# Patient Record
Sex: Male | Born: 1955 | Race: White | Hispanic: No | Marital: Married | Smoking: Former smoker
Health system: Southern US, Community
[De-identification: ages and names within clinical notes are randomized; demographics above are authoritative.]

## PROBLEM LIST (undated history)

## (undated) DIAGNOSIS — N529 Male erectile dysfunction, unspecified: Secondary | ICD-10-CM

## (undated) DIAGNOSIS — Z720 Tobacco use: Secondary | ICD-10-CM

## (undated) DIAGNOSIS — F32A Depression, unspecified: Secondary | ICD-10-CM

## (undated) DIAGNOSIS — E785 Hyperlipidemia, unspecified: Secondary | ICD-10-CM

## (undated) DIAGNOSIS — E559 Vitamin D deficiency, unspecified: Secondary | ICD-10-CM

## (undated) DIAGNOSIS — I10 Essential (primary) hypertension: Secondary | ICD-10-CM

## (undated) DIAGNOSIS — M1990 Unspecified osteoarthritis, unspecified site: Secondary | ICD-10-CM

## (undated) DIAGNOSIS — F329 Major depressive disorder, single episode, unspecified: Secondary | ICD-10-CM

## (undated) DIAGNOSIS — R079 Chest pain, unspecified: Secondary | ICD-10-CM

## (undated) DIAGNOSIS — F419 Anxiety disorder, unspecified: Secondary | ICD-10-CM

## (undated) DIAGNOSIS — I639 Cerebral infarction, unspecified: Secondary | ICD-10-CM

## (undated) HISTORY — PX: APPENDECTOMY: SHX54

## (undated) HISTORY — DX: Major depressive disorder, single episode, unspecified: F32.9

## (undated) HISTORY — DX: Depression, unspecified: F32.A

## (undated) HISTORY — DX: Unspecified osteoarthritis, unspecified site: M19.90

## (undated) HISTORY — DX: Cerebral infarction, unspecified: I63.9

## (undated) HISTORY — DX: Essential (primary) hypertension: I10

## (undated) HISTORY — DX: Anxiety disorder, unspecified: F41.9

---
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
## 2002-06-01 ENCOUNTER — Emergency Department (HOSPITAL_COMMUNITY): Admission: EM | Admit: 2002-06-01 | Discharge: 2002-06-02 | Payer: Self-pay | Admitting: Emergency Medicine

## 2002-06-02 ENCOUNTER — Encounter: Payer: Self-pay | Admitting: Emergency Medicine

## 2002-07-08 ENCOUNTER — Ambulatory Visit (HOSPITAL_COMMUNITY): Admission: RE | Admit: 2002-07-08 | Discharge: 2002-07-08 | Payer: Self-pay | Admitting: Family Medicine

## 2002-07-08 ENCOUNTER — Encounter: Payer: Self-pay | Admitting: Family Medicine

## 2003-08-14 DIAGNOSIS — R079 Chest pain, unspecified: Secondary | ICD-10-CM

## 2003-08-14 HISTORY — DX: Chest pain, unspecified: R07.9

## 2003-12-05 ENCOUNTER — Emergency Department (HOSPITAL_COMMUNITY): Admission: EM | Admit: 2003-12-05 | Discharge: 2003-12-05 | Payer: Self-pay | Admitting: Emergency Medicine

## 2003-12-20 ENCOUNTER — Observation Stay (HOSPITAL_COMMUNITY): Admission: AD | Admit: 2003-12-20 | Discharge: 2003-12-21 | Payer: Self-pay | Admitting: *Deleted

## 2012-03-26 ENCOUNTER — Observation Stay (HOSPITAL_COMMUNITY): Payer: Self-pay

## 2012-03-26 ENCOUNTER — Encounter (HOSPITAL_COMMUNITY): Payer: Self-pay | Admitting: Emergency Medicine

## 2012-03-26 ENCOUNTER — Observation Stay (HOSPITAL_COMMUNITY)
Admission: EM | Admit: 2012-03-26 | Discharge: 2012-03-27 | Disposition: A | Discharge status: Home / Self Care | Payer: Self-pay | Attending: Internal Medicine | Admitting: Internal Medicine

## 2012-03-26 ENCOUNTER — Emergency Department (HOSPITAL_COMMUNITY): Payer: Self-pay

## 2012-03-26 DIAGNOSIS — Z720 Tobacco use: Secondary | ICD-10-CM | POA: Diagnosis present

## 2012-03-26 DIAGNOSIS — E785 Hyperlipidemia, unspecified: Secondary | ICD-10-CM | POA: Insufficient documentation

## 2012-03-26 DIAGNOSIS — F17200 Nicotine dependence, unspecified, uncomplicated: Secondary | ICD-10-CM | POA: Insufficient documentation

## 2012-03-26 DIAGNOSIS — R001 Bradycardia, unspecified: Secondary | ICD-10-CM | POA: Diagnosis present

## 2012-03-26 DIAGNOSIS — R0602 Shortness of breath: Secondary | ICD-10-CM | POA: Insufficient documentation

## 2012-03-26 DIAGNOSIS — I498 Other specified cardiac arrhythmias: Secondary | ICD-10-CM | POA: Insufficient documentation

## 2012-03-26 DIAGNOSIS — R079 Chest pain, unspecified: Principal | ICD-10-CM | POA: Insufficient documentation

## 2012-03-26 HISTORY — DX: Chest pain, unspecified: R07.9

## 2012-03-26 HISTORY — DX: Tobacco use: Z72.0

## 2012-03-26 HISTORY — DX: Hyperlipidemia, unspecified: E78.5

## 2012-03-26 LAB — BASIC METABOLIC PANEL
BUN: 8 mg/dL (ref 6–23)
CO2: 28 mEq/L (ref 19–32)
Calcium: 9.3 mg/dL (ref 8.4–10.5)
Chloride: 101 mEq/L (ref 96–112)
Creatinine, Ser: 1.19 mg/dL (ref 0.50–1.35)
GFR calc Af Amer: 77 mL/min — ABNORMAL LOW (ref >90)
GFR calc non Af Amer: 67 mL/min — ABNORMAL LOW (ref >90)
Glucose, Bld: 93 mg/dL (ref 70–99)
Potassium: 4.1 mEq/L (ref 3.5–5.1)
Sodium: 138 mEq/L (ref 135–145)

## 2012-03-26 LAB — CARDIAC PANEL(CRET KIN+CKTOT+MB+TROPI)
CK, MB: 2.5 ng/mL (ref 0.3–4.0)
CK, MB: 2.3 ng/mL (ref 0.3–4.0)
Relative Index: 1.9 (ref 0.0–2.5)
Relative Index: 1.8 (ref 0.0–2.5)
Total CK: 133 U/L (ref 7–232)
Total CK: 125 U/L (ref 7–232)
Troponin I: <0.3 ng/mL (ref <0.30)
Troponin I: <0.3 ng/mL (ref <0.30)

## 2012-03-26 LAB — CBC WITH DIFFERENTIAL/PLATELET
Basophils Absolute: 0 10*3/uL (ref 0.0–0.1)
Basophils Relative: 0 % (ref 0–1)
Eosinophils Absolute: 0.2 10*3/uL (ref 0.0–0.7)
Eosinophils Relative: 4 % (ref 0–5)
HCT: 41.8 % (ref 39.0–52.0)
Hemoglobin: 15 g/dL (ref 13.0–17.0)
Lymphocytes Relative: 33 % (ref 12–46)
Lymphs Abs: 2.2 10*3/uL (ref 0.7–4.0)
MCH: 34 pg (ref 26.0–34.0)
MCHC: 35.9 g/dL (ref 30.0–36.0)
MCV: 94.8 fL (ref 78.0–100.0)
Monocytes Absolute: 0.4 10*3/uL (ref 0.1–1.0)
Monocytes Relative: 6 % (ref 3–12)
Neutro Abs: 3.8 10*3/uL (ref 1.7–7.7)
Neutrophils Relative %: 57 % (ref 43–77)
Platelets: 187 10*3/uL (ref 150–400)
RBC: 4.41 MIL/uL (ref 4.22–5.81)
RDW: 12.2 % (ref 11.5–15.5)
WBC: 6.7 10*3/uL (ref 4.0–10.5)

## 2012-03-26 LAB — D-DIMER, QUANTITATIVE: D-Dimer, Quant: 0.52 ug/mL-FEU — ABNORMAL HIGH (ref 0.00–0.48)

## 2012-03-26 LAB — TROPONIN I: Troponin I: <0.3 ng/mL (ref <0.30)

## 2012-03-26 MED ORDER — MORPHINE SULFATE 2 MG/ML IJ SOLN: 2.0000 mg | INTRAMUSCULAR | Status: DC | PRN

## 2012-03-26 MED ORDER — ACETAMINOPHEN 325 MG PO TABS: 650.0000 mg | ORAL_TABLET | Freq: Four times a day (QID) | ORAL | Status: DC | PRN

## 2012-03-26 MED ORDER — ACETAMINOPHEN 650 MG RE SUPP: 650.0000 mg | Freq: Four times a day (QID) | RECTAL | Status: DC | PRN

## 2012-03-26 MED ORDER — SODIUM CHLORIDE 0.9 % IV SOLN: 250.0000 mL | INTRAVENOUS | Status: DC | PRN

## 2012-03-26 MED ORDER — ENOXAPARIN SODIUM 40 MG/0.4ML ~~LOC~~ SOLN
40.0000 mg | SUBCUTANEOUS | Status: DC
  Administered 2012-03-26 – 2012-03-27 (×2): 40 mg via SUBCUTANEOUS
  Filled 2012-03-26 (×2): qty 0.4

## 2012-03-26 MED ORDER — IOHEXOL 350 MG/ML SOLN
100.0000 mL | Freq: Once | INTRAVENOUS | Status: AC | PRN
  Administered 2012-03-26: 100 mL via INTRAVENOUS

## 2012-03-26 MED ORDER — TRAZODONE HCL 50 MG PO TABS
50.0000 mg | ORAL_TABLET | Freq: Every evening | ORAL | Status: DC | PRN
  Filled 2012-03-26: qty 1

## 2012-03-26 MED ORDER — SODIUM CHLORIDE 0.9 % IJ SOLN: 3.0000 mL | INTRAMUSCULAR | Status: DC | PRN

## 2012-03-26 MED ORDER — ASPIRIN EC 81 MG PO TBEC
81.0000 mg | DELAYED_RELEASE_TABLET | Freq: Every day | ORAL | Status: DC
  Administered 2012-03-27: 81 mg via ORAL
  Filled 2012-03-26: qty 1

## 2012-03-26 MED ORDER — SODIUM CHLORIDE 0.9 % IJ SOLN
3.0000 mL | Freq: Two times a day (BID) | INTRAMUSCULAR | Status: DC
  Administered 2012-03-26 – 2012-03-27 (×3): 3 mL via INTRAVENOUS
  Filled 2012-03-26: qty 3

## 2012-03-26 MED ORDER — SODIUM CHLORIDE 0.9 % IJ SOLN
3.0000 mL | Freq: Two times a day (BID) | INTRAMUSCULAR | Status: DC
  Administered 2012-03-27: 3 mL via INTRAVENOUS
  Filled 2012-03-26 (×3): qty 3

## 2012-03-26 NOTE — ED Provider Notes (Signed)
History  This chart was scribed for Geoffery Lyons, MD by Ladona Ridgel Day. This patient was seen in room APA11/APA11 and the patient's care was started at 1115.   CSN: [id number]  Arrival date & time 03/26/12  1115   First MD Initiated Contact with Patient 03/26/12 1320      Chief Complaint  Patient presents with  . Chest Pain   Patient is a 56 y.o. male presenting with chest pain. The history is provided by the patient. No language interpreter was used.  Chest Pain The chest pain began yesterday. Chest pain occurs constantly. The chest pain is unchanged. The pain is associated with breathing. The severity of the pain is moderate. The quality of the pain is described as aching. The pain radiates to the left arm. Chest pain is worsened by deep breathing. Pertinent negatives for primary symptoms include no fever, no cough, no abdominal pain, no nausea and no vomiting.  Pertinent negatives for associated symptoms include no lower extremity edema. He tried aspirin for the symptoms. Risk factors include being elderly, male gender and smoking/tobacco exposure.  His family medical history is significant for heart disease in family.  Procedure history is positive for cardiac catheterization.    Michael Cortez is a 56 y.o. male who presents to the Emergency Department complaining of left sided sharp chest pain which began yesterday with associated tingling sensation in his left arm. States similar symptoms 10 years ago and had heart catheterization with 70% blockage of 3 veins, no stents and no heart issues since then. He took x1 325 ASA last PM and x1 325 ASA today 6 hours ago. Denies SOB upon exertion. He starts deep inhalation makes his CP worse. He smokes 1 ppd most of his life. Father MI at 77 yoa.   He has no PCP Past Medical History  Diagnosis Date  . Anxiety     Past Surgical History  Procedure Date  . Appendectomy     No family history on file.  History  Substance Use Topics  .  Smoking status: Current Everyday Smoker -- 1.0 packs/day  . Smokeless tobacco: Not on file  . Alcohol Use: No      Review of Systems  Constitutional: Negative for fever.  HENT: Negative for congestion.   Respiratory: Negative for cough.   Cardiovascular: Positive for chest pain (Tingling sensation radiating down left arm. ).  Gastrointestinal: Negative for nausea, vomiting, abdominal pain and diarrhea.  Musculoskeletal: Negative for back pain.  Neurological: Negative for syncope.  All other systems reviewed and are negative.    Allergies  Review of patient's allergies indicates no known allergies.  Home Medications   Current Outpatient Rx  Name Route Sig Dispense Refill  . ASPIRIN 325 MG PO TBEC Oral Take 325 mg by mouth daily.      Triage VitalsBP 141/89  Pulse 67  Temp 98.6 F (37 C) (Oral)  Resp 18  Ht 5\' 4"  (1.626 m)  Wt 178 lb (80.74 kg)  BMI 30.55 kg/m2  SpO2 96%  Physical Exam  Nursing note and vitals reviewed. Constitutional: He is oriented to person, place, and time. He appears well-developed and well-nourished. No distress.  HENT:  Head: Normocephalic and atraumatic.  Eyes: EOM are normal.  Neck: Neck supple. No tracheal deviation present.  Cardiovascular: Normal rate, regular rhythm and normal heart sounds.   Pulmonary/Chest: Effort normal and breath sounds normal. No respiratory distress. He has no wheezes. He exhibits no tenderness.  Abdominal: Soft. He exhibits  no distension. There is no tenderness. There is no rebound.  Musculoskeletal: Normal range of motion. He exhibits no edema.  Neurological: He is alert and oriented to person, place, and time.  Skin: Skin is warm and dry.  Psychiatric: He has a normal mood and affect. His behavior is normal.    ED Course  Procedures (including critical care time) DIAGNOSTIC STUDIES: Oxygen Saturation is 96% on room air, normal by my interpretation.    COORDINATION OF CARE: At 130 PM Discussed treatment  plan with patient which includes EKG, blood work, heart markers, CXR. Patient agrees.   Labs Reviewed  BASIC METABOLIC PANEL - Abnormal; Notable for the following:    GFR calc non Af Amer 67 (*)     GFR calc Af Amer 77 (*)     All other components within normal limits  CBC WITH DIFFERENTIAL  TROPONIN I   Dg Chest 2 View  03/26/2012  *RADIOLOGY REPORT*  Clinical Data: Chest pain and shortness of breath.  CHEST - 2 VIEW  Comparison: 12/20/2003  Findings: The lungs are clear without focal infiltrate, edema, pneumothorax or pleural effusion. The cardiopericardial silhouette is within normal limits for size. Imaged bony structures of the thorax are intact. Telemetry leads overlie the chest.  IMPRESSION: Stable.  No acute findings.  Original Report Authenticated By: ERIC A. MANSELL, M.D.     No diagnosis found.   Date: 03/27/2012  Rate: 82  Rhythm: normal sinus rhythm  QRS Axis: normal  Intervals: normal  ST/T Wave abnormalities: normal  Conduction Disutrbances:none  Narrative Interpretation:   Old EKG Reviewed: unchanged    MDM  The patient presents with symptoms concerning for ischemia.  The workup is unremarkable, however the patient has multiple risk factors and had a heart cath 10 years ago that he says showed several 70% lesions.  I have consulted Dr. Lendell Caprice from [facility] for admission.     I personally performed the services described in this documentation, which was scribed in my presence. The recorded information has been reviewed and considered.           Geoffery Lyons, MD 03/27/12 2008

## 2012-03-26 NOTE — ED Notes (Signed)
Dr. Lendell Caprice notified of d-dimer results.

## 2012-03-26 NOTE — H&P (Signed)
Hospital Admission Note Date: 03/26/2012  Patient name: Michael Cortez Medical record number: [id number] Date of birth: 09-28-1955 Age: 56 y.o. Gender: male PCP: none  Attending physician: Christiane Ha, MD  Chief Complaint: Chest pain  History of Present Illness:  Michael Cortez is an 56 y.o. male smoker without a primary care physician who presents with left-sided chest pain on and off since yesterday. Radiated to left neck and shoulder. Felt sharp. Had soreness when he pressed on his chest. Took aspirin yesterday. It occurred at rest. Pain was 10 and 10 and had to leave work. No recent travel. No leg pain. No shortness of breath. Had an abnormal stress test in 2005 and a subsequent cardiac catheterization. The catheterization revealed no critical coronary artery disease. Patient continues to smoke a pack of cigarettes a day. He does not know his cholesterol. His father had a heart attack in his 36s. No pain currently. EKG and troponin showed nothing concerning.  Past Medical History  Diagnosis Date  . Anxiety     Meds: Aspirin occasionally.  Allergies: Review of patient's allergies indicates no known allergies. History   Social History  . Marital Status: Married    Spouse Name: N/A    Number of Children: N/A  . Years of Education: N/A   Occupational History  . Not on file.   Social History Main Topics  . Smoking status: Current Everyday Smoker -- 1.0 packs/day  . Smokeless tobacco: Not on file  . Alcohol Use: No  . Drug Use: No  . Sexually Active:    Other Topics Concern  . Not on file   Social History Narrative  . No narrative on file   No family history on file. Past Surgical History  Procedure Date  . Appendectomy     Review of Systems: Systems reviewed and as per HPI, otherwise negative.  Physical Exam: Blood pressure 141/89, pulse 67, temperature 98.6 F (37 C), temperature source Oral, resp. rate 18, height 5\' 4"  (1.626 m), weight 80.74 kg  (178 lb), SpO2 96.00%. BP 164/86  Pulse 63  Temp 98.6 F (37 C) (Oral)  Resp 16  Ht 5\' 4"  (1.626 m)  Wt 80.74 kg (178 lb)  BMI 30.55 kg/m2  SpO2 97%  General Appearance:    Alert, cooperative, no distress, appears stated age  Head:    Normocephalic, without obvious abnormality, atraumatic  Eyes:    PERRL, conjunctiva/corneas clear, EOM's intact, fundi    benign, both eyes          Nose:   Nares normal, septum midline, mucosa normal, no drainage    or sinus tenderness  Throat:   Lips, mucosa, and tongue normal; teeth and gums normal  Neck:   Supple, symmetrical, trachea midline, no adenopathy;       thyroid:  No enlargement/tenderness/nodules; no carotid   bruit or JVD  Back:     Symmetric, no curvature, ROM normal, no CVA tenderness  Lungs:     Clear to auscultation bilaterally, respirations unlabored  Chest wall:    No tenderness or deformity  Heart:    Regular rate and rhythm, S1 and S2 normal, no murmur, rub   or gallop  Abdomen:     Soft, non-tender, bowel sounds active all four quadrants,    no masses, no organomegaly  Genitalia:    Deferred  Rectal:    deferred  Extremities:   Extremities normal, atraumatic, no cyanosis or edema  Pulses:   2+ and symmetric  all extremities  Skin:   Skin color, texture, turgor normal, no rashes or lesions  Lymph nodes:   Cervical, supraclavicular, and axillary nodes normal  Neurologic:   CNII-XII intact. Normal strength, sensation and reflexes      throughout    Psychiatric: Normal affect.  Lab results: Basic Metabolic Panel:  Basename 03/26/12 1125  NA 138  K 4.1  CL 101  CO2 28  GLUCOSE 93  BUN 8  CREATININE 1.19  CALCIUM 9.3  MG --  PHOS --   Liver Function Tests: No results found for this basename: AST:2,ALT:2,ALKPHOS:2,BILITOT:2,PROT:2,ALBUMIN:2 in the last 72 hours No results found for this basename: LIPASE:2,AMYLASE:2 in the last 72 hours No results found for this basename: AMMONIA:2 in the last 72  hours CBC:  Basename 03/26/12 1125  WBC 6.7  NEUTROABS 3.8  HGB 15.0  HCT 41.8  MCV 94.8  PLT 187   Cardiac Enzymes:  Basename 03/26/12 1125  CKTOTAL --  CKMB --  CKMBINDEX --  TROPONINI <0.30   EKG:  NSR. Possible LAE.  Imaging results:  Dg Chest 2 View  03/26/2012  *RADIOLOGY REPORT*  Clinical Data: Chest pain and shortness of breath.  CHEST - 2 VIEW  Comparison: 12/20/2003  Findings: The lungs are clear without focal infiltrate, edema, pneumothorax or pleural effusion. The cardiopericardial silhouette is within normal limits for size. Imaged bony structures of the thorax are intact. Telemetry leads overlie the chest.  IMPRESSION: Stable.  No acute findings.  Original Report Authenticated By: ERIC A. MANSELL, M.D.    Assessment & Plan: Principal Problem:  *Chest pain Active Problems:  Tobacco abuse  Will check a d-dimer. If positive, do CT angiogram of the chest. Patient's chest pain is atypical but will monitor overnight. Rule out MI. Consider stress echocardiogram if no etiology found. He had a negative stress test previously without any critical lesion, so stress test would likely be abnormal again. Encouraged patient to quit smoking. Will check fasting lipid profile and consult cardiology.  Ayane Delancey L 03/26/2012, 2:25 PM

## 2012-03-26 NOTE — Consult Note (Signed)
Patient Name: Michael Cortez  MRN: [id number]  HPI: Michael Cortez is an 56 y.o. male referred for consultation by Dr.Corinna Burman Freestone, MD for evaluation of chest discomfort. He presented with a two-day history of intermittent lancinating left chest discomfort of moderate severity. There is mild associated dyspnea and lightheadedness without syncope. There is no apparent relationship to activity. Occurrence was in the morning on 2 successive days. Recurrence of symptoms prompted him to seek care in the emergency department where there were no EKG abnormalities and negative initial laboratory studies.  Past Medical History  Diagnosis Date  . Anxiety    Past Surgical History  Procedure Date  . Appendectomy    History reviewed. No pertinent family history.  Social History:  reports that he has been smoking.  He does not have any smokeless tobacco history on file. He reports that he does not drink alcohol or use illicit drugs.  Allergies: No Known Allergies  Medications: I have reviewed the patient's current medications.  Results for orders placed during the hospital encounter of 03/26/12 (from the past 48 hour(s))  CBC WITH DIFFERENTIAL     Status: Normal   Collection Time   03/26/12 11:25 AM      Component Value Range Comment   WBC 6.7  4.0 - 10.5 K/uL    RBC 4.41  4.22 - 5.81 MIL/uL    Hemoglobin 15.0  13.0 - 17.0 g/dL    HCT 40.9  81.1 - 91.4 %    MCV 94.8  78.0 - 100.0 fL    MCH 34.0  26.0 - 34.0 pg    MCHC 35.9  30.0 - 36.0 g/dL    RDW 78.2  95.6 - 21.3 %    Platelets 187  150 - 400 K/uL    Neutrophils Relative 57  43 - 77 %    Neutro Abs 3.8  1.7 - 7.7 K/uL    Lymphocytes Relative 33  12 - 46 %    Lymphs Abs 2.2  0.7 - 4.0 K/uL    Monocytes Relative 6  3 - 12 %    Monocytes Absolute 0.4  0.1 - 1.0 K/uL    Eosinophils Relative 4  0 - 5 %    Eosinophils Absolute 0.2  0.0 - 0.7 K/uL    Basophils Relative 0  0 - 1 %    Basophils Absolute 0.0  0.0 - 0.1 K/uL   BASIC  METABOLIC PANEL     Status: Abnormal   Collection Time   03/26/12 11:25 AM      Component Value Range Comment   Sodium 138  135 - 145 mEq/L    Potassium 4.1  3.5 - 5.1 mEq/L    Chloride 101  96 - 112 mEq/L    CO2 28  19 - 32 mEq/L    Glucose, Bld 93  70 - 99 mg/dL    BUN 8  6 - 23 mg/dL    Creatinine, Ser 0.86  0.50 - 1.35 mg/dL    Calcium 9.3  8.4 - 57.8 mg/dL    GFR calc non Af Amer 67 (*) >90 mL/min    GFR calc Af Amer 77 (*) >90 mL/min   TROPONIN I     Status: Normal   Collection Time   03/26/12 11:25 AM      Component Value Range Comment   Troponin I <0.30  <0.30 ng/mL   D-DIMER, QUANTITATIVE     Status: Abnormal   Collection Time  03/26/12  2:11 PM      Component Value Range Comment   D-Dimer, Quant 0.52 (*) 0.00 - 0.48 ug/mL-FEU   CARDIAC PANEL(CRET KIN+CKTOT+MB+TROPI)     Status: Normal   Collection Time   03/26/12  2:11 PM      Component Value Range Comment   Total CK 133  7 - 232 U/L    CK, MB 2.5  0.3 - 4.0 ng/mL    Troponin I <0.30  <0.30 ng/mL    Relative Index 1.9  0.0 - 2.5     Dg Chest 2 View  03/26/2012  *RADIOLOGY REPORT*  Clinical Data: Chest pain and shortness of breath.  CHEST - 2 VIEW  Comparison: 12/20/2003  Findings: The lungs are clear without focal infiltrate, edema, pneumothorax or pleural effusion. The cardiopericardial silhouette is within normal limits for size. Imaged bony structures of the thorax are intact. Telemetry leads overlie the chest.  IMPRESSION: Stable.  No acute findings.  Original Report Authenticated By: ERIC A. MANSELL, M.D.   Ct Angio Chest Pe W/cm &/or Wo Cm  03/26/2012  *RADIOLOGY REPORT*  Clinical Data: Chest pain.  Positive D-dimer.  CT ANGIOGRAPHY CHEST  Technique:  Multidetector CT imaging of the chest using the standard protocol during bolus administration of intravenous contrast. Multiplanar reconstructed images including MIPs were obtained and reviewed to evaluate the vascular anatomy.  Contrast: OMNIPAQUE IOHEXOL 350  MG/ML SOLN  Comparison: Plain films of the chest 03/26/2012.  Findings: No pulmonary embolus is identified.  There is no axillary, hilar or mediastinal lymphadenopathy.  Heart size is normal.  No pleural or pericardial effusion.  The lungs are clear. Incidentally imaged upper abdomen is unremarkable.  There is scattered degenerative disease in the lower thoracic spine.  No focal bony abnormality.  IMPRESSION: Negative for pulmonary embolus.  No acute finding.  Original Report Authenticated By: Bernadene Bell. Maricela Curet, M.D.   Review of Systems: Denies exertional dyspnea, orthopnea, PND, pedal edema, claudication. No history of GERD or other GI problems. Quit cigarettes in the past for 4 months, but became dependent upon aerosolized nicotine prompting him to return to cigarette smoking. All other systems reviewed and are negative.  Physical Exam: Blood pressure 154/90, pulse 58, temperature 98.5 F (36.9 C), temperature source Oral, resp. rate 18, height 5\' 4"  (1.626 m), weight 80.74 kg (178 lb), SpO2 96.00%. General-Well-developed; no acute distress HEENT-[place]/AT; PERRL; EOM intact; conjunctiva and lids nl Neck-No JVD; no carotid bruits Endocrine-No thyromegaly Lungs-Clear lung fields; resonant percussion; normal I-to-E ratio Cardiovascular- normal PMI; normal S1 and S2 Abdomen-BS normal; soft and non-tender without masses or organomegaly Musculoskeletal-No deformities, cyanosis or clubbing Neurologic-Nl cranial nerves; symmetric strength and tone Skin- Warm, no significant lesions Extremities-Nl distal pulses; no edema  Assessment/Plan:  Chest pain is atypical, and initially negative cardiac markers and EKGs provide additional reassurance that this is not an acute coronary syndrome; however, he has known, albeit nonobstructive, coronary artery disease and multiple cardiovascular risk factors. Repeat noninvasive evaluation is appropriate. Abnormal stress nuclear study 8 years ago sounds as if it was  minimally abnormal, and did not necessarily mandate coronary angiography.  We will proceed with a repeat nuclear study in the morning.  A lipid profile will be obtained.  He will likely require statin therapy. He is strongly encouraged to discontinue tobacco use and indicates that he will be willing to utilize an electronic cigarette as a substitute.  [place] Bing, MD 03/26/2012, 6:47 PM

## 2012-03-26 NOTE — ED Notes (Signed)
Pt c/o cp/sob yesterday, reoccurring today with pain/tingling in left arm. Some nausea and dizziness.

## 2012-03-27 ENCOUNTER — Encounter (HOSPITAL_COMMUNITY): Payer: Self-pay

## 2012-03-27 ENCOUNTER — Inpatient Hospital Stay (HOSPITAL_COMMUNITY): Payer: MEDICAID

## 2012-03-27 ENCOUNTER — Encounter (HOSPITAL_COMMUNITY): Payer: Self-pay | Admitting: Cardiology

## 2012-03-27 ENCOUNTER — Inpatient Hospital Stay (HOSPITAL_COMMUNITY): Payer: Self-pay

## 2012-03-27 DIAGNOSIS — I498 Other specified cardiac arrhythmias: Secondary | ICD-10-CM

## 2012-03-27 DIAGNOSIS — E785 Hyperlipidemia, unspecified: Secondary | ICD-10-CM | POA: Diagnosis present

## 2012-03-27 DIAGNOSIS — R079 Chest pain, unspecified: Secondary | ICD-10-CM

## 2012-03-27 DIAGNOSIS — R001 Bradycardia, unspecified: Secondary | ICD-10-CM | POA: Diagnosis present

## 2012-03-27 LAB — LIPID PANEL
Cholesterol: 228 mg/dL — ABNORMAL HIGH (ref 0–200)
HDL: 58 mg/dL (ref >39)
LDL Cholesterol: 147 mg/dL — ABNORMAL HIGH (ref 0–99)
Total CHOL/HDL Ratio: 3.9 RATIO
Triglycerides: 115 mg/dL (ref <150)
VLDL: 23 mg/dL (ref 0–40)

## 2012-03-27 LAB — T4, FREE: Free T4: 1.13 ng/dL (ref 0.80–1.80)

## 2012-03-27 LAB — TSH: TSH: 1.463 u[IU]/mL (ref 0.350–4.500)

## 2012-03-27 MED ORDER — TECHNETIUM TC 99M TETROFOSMIN IV KIT
10.0000 | PACK | Freq: Once | INTRAVENOUS | Status: AC | PRN
  Administered 2012-03-27: 9.6 via INTRAVENOUS

## 2012-03-27 MED ORDER — ATORVASTATIN CALCIUM 20 MG PO TABS
20.0000 mg | ORAL_TABLET | Freq: Every day | ORAL | Status: DC
  Administered 2012-03-27: 20 mg via ORAL
  Filled 2012-03-27: qty 1

## 2012-03-27 MED ORDER — SODIUM CHLORIDE 0.9 % IJ SOLN
INTRAMUSCULAR | Status: AC
  Administered 2012-03-27: 10 mL via INTRAVENOUS
  Filled 2012-03-27: qty 10

## 2012-03-27 MED ORDER — TECHNETIUM TC 99M TETROFOSMIN IV KIT
30.0000 | PACK | Freq: Once | INTRAVENOUS | Status: AC | PRN
  Administered 2012-03-27: 30 via INTRAVENOUS

## 2012-03-27 MED ORDER — REGADENOSON 0.4 MG/5ML IV SOLN
INTRAVENOUS | Status: AC
  Filled 2012-03-27: qty 5

## 2012-03-27 MED ORDER — PRAVASTATIN SODIUM 20 MG PO TABS: 20.0000 mg | ORAL_TABLET | Freq: Every day | ORAL | Status: DC

## 2012-03-27 NOTE — Progress Notes (Signed)
Discharge Summary: a/o.vss. Up ad lib. Saline lock removed. No complaints of distress. Discharge instructions given. Prescriptions given. Pt verbalized understanding of instructions. Left floor ambulatory with nursing staff and family member.

## 2012-03-27 NOTE — Progress Notes (Signed)
Utilization Review Complete  

## 2012-03-27 NOTE — Discharge Summary (Signed)
Physician Discharge Summary  JOHNATON SONNEBORN ZOX:[id number] DOB: 1956/03/30 DOA: 03/26/2012  PCP: No primary provider on file.  Admit date: 03/26/2012 Discharge date: 03/27/2012  Recommendations for Outpatient Follow-up:  1. The patient was discharged to home in improved condition.  Discharge Diagnoses:  1. Chest pain. Myocardial infarction ruled out. CT angiogram of the chest negative for PE. Nuclear medicine stress test negative. 2. Tobacco abuse. The patient was strongly advised to stop smoking. 3. Transient bradycardia. TSH and free T4 were within normal limits. 4. Hyperlipidemia. The patient's fasting lipid profile revealed a total cholesterol of 228, triglycerides of 1:15, HDL cholesterol 58, and LDL cholesterol 147.  Discharge Condition: Improved.  Diet recommendation: Heart healthy.  Filed Weights   03/26/12 1120  Weight: 80.74 kg (178 lb)    History of present illness:  The patient is a 56 year old man with a past medical history significant for an abnormal stress test in 2005 and subsequent cardiac catheterization which revealed no critical coronary artery disease, who presented to the emergency department on 03/26/2012 with a chief complaint of chest pain. In the emergency department, he was afebrile and hemodynamically stable. His lab data were unremarkable. Specifically, his initial cardiac enzymes were within normal limits. His EKG revealed normal sinus rhythm with a heart rate of 81 beats per minute and no ST or T wave abnormalities. His chest x-ray revealed no acute findings. He was admitted for further evaluation and management.  Hospital Course:  The patient was continued on aspirin therapy. Analgesics were ordered as needed for chest pain. Tobacco cessation counseling was ordered. The patient was strongly advised to stop smoking. For further evaluation, a number of studies were ordered. His d-dimer was modestly elevated. Therefore, a CT angiogram of his chest was ordered.  It was negative for pulmonary embolism. All of his cardiac enzymes were within normal limits. His TSH and free T4 within normal limits. His fasting lipid profile results were dictated above. Following the results, he was started on Lipitor. Because of cost, he was discharged on pravastatin.  Cardiologist, Dr. Dietrich Pates was consulted. Following his evaluation, he advised that the patient stop smoking. The patient stated that he would try one of those vapor cigarettes or electronic cigarette to stop smoking. He was further evaluated with a nuclear medicine stress test. The stress test was essentially negative.  The patient became chest pain-free. He remained hemodynamically stable and afebrile. He was discharged to home in no acute distress.  Procedures: Nuclear medicine stress test.  Consultations:  [place] Bing, M.D.   Discharge Exam: Filed Vitals:   03/27/12 1406  BP: 116/69  Pulse: 60  Temp: 98.1 F (36.7 C)  Resp: 18   Filed Vitals:   03/26/12 1525 03/26/12 2114 03/27/12 0417 03/27/12 1406  BP: 154/90 131/67 118/64 116/69  Pulse: 58 61 57 60  Temp: 98.5 F (36.9 C) 98.4 F (36.9 C) 98.1 F (36.7 C) 98.1 F (36.7 C)  TempSrc: Oral Oral Oral   Resp: 18 20 20 18   Height:      Weight:      SpO2: 96% 97% 97% 99%    General: 56 year old Caucasian man sitting up in bed, in no acute distress.  Cardiovascular: S1, S2, with borderline bradycardia.  Respiratory: clear to auscultation bilaterally.   Discharge Instructions  Discharge Orders    Future Orders Please Complete By Expires   Diet - low sodium heart healthy      Increase activity slowly        Medication  List  As of 03/27/2012  6:17 PM   TAKE these medications         aspirin 325 MG EC tablet   Take 325 mg by mouth daily.      pravastatin 20 MG tablet   Commonly known as: PRAVACHOL   Take 1 tablet (20 mg total) by mouth daily.              The results of significant diagnostics from this  hospitalization (including imaging, microbiology, ancillary and laboratory) are listed below for reference.    Significant Diagnostic Studies: Dg Chest 2 View  03/26/2012  *RADIOLOGY REPORT*  Clinical Data: Chest pain and shortness of breath.  CHEST - 2 VIEW  Comparison: 12/20/2003  Findings: The lungs are clear without focal infiltrate, edema, pneumothorax or pleural effusion. The cardiopericardial silhouette is within normal limits for size. Imaged bony structures of the thorax are intact. Telemetry leads overlie the chest.  IMPRESSION: Stable.  No acute findings.  Original Report Authenticated By: ERIC A. MANSELL, M.D.   Ct Angio Chest Pe W/cm &/or Wo Cm  03/26/2012  *RADIOLOGY REPORT*  Clinical Data: Chest pain.  Positive D-dimer.  CT ANGIOGRAPHY CHEST  Technique:  Multidetector CT imaging of the chest using the standard protocol during bolus administration of intravenous contrast. Multiplanar reconstructed images including MIPs were obtained and reviewed to evaluate the vascular anatomy.  Contrast: OMNIPAQUE IOHEXOL 350 MG/ML SOLN  Comparison: Plain films of the chest 03/26/2012.  Findings: No pulmonary embolus is identified.  There is no axillary, hilar or mediastinal lymphadenopathy.  Heart size is normal.  No pleural or pericardial effusion.  The lungs are clear. Incidentally imaged upper abdomen is unremarkable.  There is scattered degenerative disease in the lower thoracic spine.  No focal bony abnormality.  IMPRESSION: Negative for pulmonary embolus.  No acute finding.  Original Report Authenticated By: Bernadene Bell. Maricela Curet, M.D.    Microbiology: No results found for this or any previous visit (from the past 240 hour(s)).   Labs: Basic Metabolic Panel:  Lab 03/26/12 1610  NA 138  K 4.1  CL 101  CO2 28  GLUCOSE 93  BUN 8  CREATININE 1.19  CALCIUM 9.3  MG --  PHOS --   Liver Function Tests: No results found for this basename: AST:5,ALT:5,ALKPHOS:5,BILITOT:5,PROT:5,ALBUMIN:5  in the last 168 hours No results found for this basename: LIPASE:5,AMYLASE:5 in the last 168 hours No results found for this basename: AMMONIA:5 in the last 168 hours CBC:  Lab 03/26/12 1125  WBC 6.7  NEUTROABS 3.8  HGB 15.0  HCT 41.8  MCV 94.8  PLT 187   Cardiac Enzymes:  Lab 03/26/12 2017 03/26/12 1411 03/26/12 1125  CKTOTAL 125 133 --  CKMB 2.3 2.5 --  CKMBINDEX -- -- --  TROPONINI <0.30 <0.30 <0.30   BNP: BNP (last 3 results) No results found for this basename: PROBNP:3 in the last 8760 hours CBG: No results found for this basename: GLUCAP:5 in the last 168 hours  Time coordinating discharge: less than 35 minutes    Signed:  Ambre Kobayashi  [facility] 03/27/2012, 6:17 PM

## 2012-03-27 NOTE — Progress Notes (Signed)
Michael Cortez  56 y.o.  male  Subjective: No problems overnight; chest pain has resolved  Allergy: Review of patient's allergies indicates no known allergies.  Objective: Vital signs in last 24 hours: Temp:  [98.1 F (36.7 C)-98.4 F (36.9 C)] 98.1 F (36.7 C) (08/15 1406) Pulse Rate:  [57-61] 60  (08/15 1406) Resp:  [18-20] 18  (08/15 1406) BP: (116-131)/(64-69) 116/69 mmHg (08/15 1406) SpO2:  [97 %-99 %] 99 % (08/15 1406)  80.74 kg (178 lb) Body mass index is 30.55 kg/(m^2).  Weight change:  Last BM Date: 03/26/12  Intake/Output from previous day: 08/14 0701 - 08/15 0700 In: 480 [P.O.:480] Out: -   General-Well-developed; no acute distress  HEENT-[place]/AT; PERRL; EOM intact; conjunctiva and lids nl  Neck-No JVD; no carotid bruits  Endocrine-No thyromegaly  Lungs-Clear lung fields; resonant percussion; normal I-to-E ratio  Cardiovascular- normal PMI; normal S1 and S2  Abdomen-BS normal; soft and non-tender without masses or organomegaly  Musculoskeletal-No deformities, cyanosis or clubbing  Neurologic-Nl cranial nerves; symmetric strength and tone  Skin- Warm, no significant lesions  Extremities-Nl distal pulses; no edema  Lab Results: Cardiac Markers:   Basename 03/26/12 2017 03/26/12 1411  TROPONINI <0.30 <0.30   CBC:   Basename 03/26/12 1125  WBC 6.7  HGB 15.0  HCT 41.8  PLT 187   BMET:  Basename 03/26/12 1125  NA 138  K 4.1  CL 101  CO2 28  GLUCOSE 93  BUN 8  CREATININE 1.19  CALCIUM 9.3   Lipids:   Basename 03/27/12 0445  CHOL 228*  TRIG 115  HDL 58   Lab Results  Component Value Date   LDLCALC 147* 03/27/2012   Imaging Studies/Results: Stress nuclear study demonstrates good exercise tolerance, borderline stress-induced EKG abnormalities but no evidence for ischemia or infarction by scintigraphic imaging. LV systolic function was normal.  Imaging: Imaging results have been reviewed  Medications: I have reviewed the patient's current  medications.   Assessment/Plan: Chest pain: Negative stress nuclear study with good exercise tolerance and no exercise-induced chest discomfort suggests that insignificant coronary disease, which was identified at coronary angiography 8 years ago, has not progressed significantly.  No vascular calcifications identified on the CT scan of the chest. Hospital discharge is recommended with cardiology followup only if chest discomfort recurs.  Lipid profile is suboptimal.  Since some coronary disease was present in 2005, pharmacologic therapy with a statin to forestall progression of disease is appropriate.  Tobacco abuse-efforts to achieve and maintain cessation of tobacco use are recommended.   LOS: 1 day   [place]k Bing 03/27/2012, 5:20 PM

## 2012-03-27 NOTE — Progress Notes (Signed)
Stress Lab Nurses Notes - Michael Cortez  Michael Cortez 03/27/2012 Reason for doing test: Chest Pain, Dyspnea and lightheadedness Type of test: Stress Myoview Nurse performing test: Parke Poisson, RN Nuclear Medicine Tech: Lyndel Pleasure Echo Tech: Not Applicable MD performing test: R. Dietrich Pates Family MD: Lendell Caprice Test explained and consent signed: yes IV started: 22g jelco, Saline lock flushed, No redness or edema and Saline lock started in radiology Symptoms: none Treatment/Intervention: None Reason test stopped: fatigue After recovery IV was: Saline Lock flushed Patient to return to Nuc. Med at : 13:30 Patient discharged: Transported back to room 310 via wc Patient's Condition upon discharge was: stable Comments: During test BP  193/66 & HR 142.  Recovery BP 138/76 & HR 76.  Symptoms resolved in recovery. Erskine Speed T

## 2012-03-27 NOTE — Progress Notes (Signed)
Subjective: The patient has no complaints of chest pain or shortness of breath.  Objective: Vital signs in last 24 hours: Filed Vitals:   03/26/12 1429 03/26/12 1525 03/26/12 2114 03/27/12 0417  BP: 164/86 154/90 131/67 118/64  Pulse: 63 58 61 57  Temp:  98.5 F (36.9 C) 98.4 F (36.9 C) 98.1 F (36.7 C)  TempSrc:  Oral Oral Oral  Resp: 16 18 20 20   Height:      Weight:      SpO2: 97% 96% 97% 97%    Intake/Output Summary (Last 24 hours) at 03/27/12 0734 Last data filed at 03/26/12 2233  Gross per 24 hour  Intake    480 ml  Output      0 ml  Net    480 ml    Weight change:  Physical exam: General: Pleasant 56 year old Caucasian man laying in bed, in no acute distress. Lungs: Rare wheeze, otherwise clear. Heart: S1, S2, with borderline bradycardia. Abdomen: Positive bowel sounds, soft, nontender, nondistended. Extremities: No pedal edema. Neurologic: He is alert and oriented x3. Cranial nerves II through XII are intact.  Lab Results: Basic Metabolic Panel:  Basename 03/26/12 1125  NA 138  K 4.1  CL 101  CO2 28  GLUCOSE 93  BUN 8  CREATININE 1.19  CALCIUM 9.3  MG --  PHOS --   Liver Function Tests: No results found for this basename: AST:2,ALT:2,ALKPHOS:2,BILITOT:2,PROT:2,ALBUMIN:2 in the last 72 hours No results found for this basename: LIPASE:2,AMYLASE:2 in the last 72 hours No results found for this basename: AMMONIA:2 in the last 72 hours CBC:  Basename 03/26/12 1125  WBC 6.7  NEUTROABS 3.8  HGB 15.0  HCT 41.8  MCV 94.8  PLT 187   Cardiac Enzymes:  Basename 03/26/12 2017 03/26/12 1411 03/26/12 1125  CKTOTAL 125 133 --  CKMB 2.3 2.5 --  CKMBINDEX -- -- --  TROPONINI <0.30 <0.30 <0.30   BNP: No results found for this basename: PROBNP:3 in the last 72 hours D-Dimer:  [facility] 03/26/12 1411  DDIMER 0.52*   CBG: No results found for this basename: GLUCAP:6 in the last 72 hours Hemoglobin A1C: No results found for this basename: HGBA1C in  the last 72 hours Fasting Lipid Panel:  Basename 03/27/12 0445  CHOL 228*  HDL 58  LDLCALC 147*  TRIG 115  CHOLHDL 3.9  LDLDIRECT --   Thyroid Function Tests: No results found for this basename: TSH,T4TOTAL,FREET4,T3FREE,THYROIDAB in the last 72 hours Anemia Panel: No results found for this basename: VITAMINB12,FOLATE,FERRITIN,TIBC,IRON,RETICCTPCT in the last 72 hours Coagulation: No results found for this basename: LABPROT:2,INR:2 in the last 72 hours Urine Drug Screen: Drugs of Abuse  No results found for this basename: labopia, cocainscrnur, labbenz, amphetmu, thcu, labbarb    Alcohol Level: No results found for this basename: ETH:2 in the last 72 hours Urinalysis: No results found for this basename: COLORURINE:2,APPERANCEUR:2,LABSPEC:2,PHURINE:2,GLUCOSEU:2,HGBUR:2,BILIRUBINUR:2,KETONESUR:2,PROTEINUR:2,UROBILINOGEN:2,NITRITE:2,LEUKOCYTESUR:2 in the last 72 hours Misc. Labs:   Micro: No results found for this or any previous visit (from the past 240 hour(s)).  Studies/Results: Dg Chest 2 View  03/26/2012  *RADIOLOGY REPORT*  Clinical Data: Chest pain and shortness of breath.  CHEST - 2 VIEW  Comparison: 12/20/2003  Findings: The lungs are clear without focal infiltrate, edema, pneumothorax or pleural effusion. The cardiopericardial silhouette is within normal limits for size. Imaged bony structures of the thorax are intact. Telemetry leads overlie the chest.  IMPRESSION: Stable.  No acute findings.  Original Report Authenticated By: ERIC A. MANSELL, M.D.   Ct  Angio Chest Pe W/cm &/or Wo Cm  03/26/2012  *RADIOLOGY REPORT*  Clinical Data: Chest pain.  Positive D-dimer.  CT ANGIOGRAPHY CHEST  Technique:  Multidetector CT imaging of the chest using the standard protocol during bolus administration of intravenous contrast. Multiplanar reconstructed images including MIPs were obtained and reviewed to evaluate the vascular anatomy.  Contrast: OMNIPAQUE IOHEXOL 350 MG/ML SOLN   Comparison: Plain films of the chest 03/26/2012.  Findings: No pulmonary embolus is identified.  There is no axillary, hilar or mediastinal lymphadenopathy.  Heart size is normal.  No pleural or pericardial effusion.  The lungs are clear. Incidentally imaged upper abdomen is unremarkable.  There is scattered degenerative disease in the lower thoracic spine.  No focal bony abnormality.  IMPRESSION: Negative for pulmonary embolus.  No acute finding.  Original Report Authenticated By: Bernadene Bell. Maricela Curet, M.D.    Medications:  Scheduled:   . aspirin EC  81 mg Oral Daily  . atorvastatin  20 mg Oral q1800  . enoxaparin (LOVENOX) injection  40 mg Subcutaneous Q24H  . sodium chloride  3 mL Intravenous Q12H  . sodium chloride  3 mL Intravenous Q12H   Continuous:  ZOX:WRUEAV chloride, acetaminophen, acetaminophen, iohexol, morphine injection, sodium chloride, traZODone  Assessment: Principal Problem:  *Chest pain Active Problems:  Tobacco abuse  Bradycardia  Hyperlipidemia  The patient is currently chest pain-free. He is hemodynamically stable although he is mildly bradycardic. We'll check his TSH and free T4. His d-dimer was slightly elevated, but the CT angiogram of his chest was negative for PE. His fasting lipid profile is noted for hyperlipidemia, will therefore start Lipitor. He was encouraged to stop smoking. Cardiac stress test is pending today.  Plan: 1. Start Lipitor. 2. We'll order TSH and free T4. 3. Nuclear medicine stress test today. 4. Possible discharge to home today following cardiac stress test or when okay with cardiology.   LOS: 1 day   Tanav Orsak 03/27/2012, 7:34 AM

## 2012-03-31 ENCOUNTER — Emergency Department (HOSPITAL_COMMUNITY)
Admission: EM | Admit: 2012-03-31 | Discharge: 2012-03-31 | Disposition: A | Discharge status: Home / Self Care | Payer: Self-pay | Attending: Emergency Medicine | Admitting: Emergency Medicine

## 2012-03-31 ENCOUNTER — Encounter (HOSPITAL_COMMUNITY): Payer: Self-pay | Admitting: *Deleted

## 2012-03-31 ENCOUNTER — Other Ambulatory Visit: Payer: Self-pay

## 2012-03-31 ENCOUNTER — Encounter: Payer: Self-pay | Admitting: *Deleted

## 2012-03-31 DIAGNOSIS — E785 Hyperlipidemia, unspecified: Secondary | ICD-10-CM | POA: Insufficient documentation

## 2012-03-31 DIAGNOSIS — F17200 Nicotine dependence, unspecified, uncomplicated: Secondary | ICD-10-CM | POA: Insufficient documentation

## 2012-03-31 DIAGNOSIS — R200 Anesthesia of skin: Secondary | ICD-10-CM

## 2012-03-31 DIAGNOSIS — F411 Generalized anxiety disorder: Secondary | ICD-10-CM | POA: Insufficient documentation

## 2012-03-31 DIAGNOSIS — M542 Cervicalgia: Secondary | ICD-10-CM

## 2012-03-31 DIAGNOSIS — R110 Nausea: Secondary | ICD-10-CM | POA: Insufficient documentation

## 2012-03-31 DIAGNOSIS — R079 Chest pain, unspecified: Secondary | ICD-10-CM | POA: Insufficient documentation

## 2012-03-31 DIAGNOSIS — M79609 Pain in unspecified limb: Secondary | ICD-10-CM | POA: Insufficient documentation

## 2012-03-31 DIAGNOSIS — R209 Unspecified disturbances of skin sensation: Secondary | ICD-10-CM | POA: Insufficient documentation

## 2012-03-31 LAB — BASIC METABOLIC PANEL
BUN: 10 mg/dL (ref 6–23)
CO2: 24 mEq/L (ref 19–32)
Calcium: 9.4 mg/dL (ref 8.4–10.5)
Chloride: 100 mEq/L (ref 96–112)
Creatinine, Ser: 1.27 mg/dL (ref 0.50–1.35)
GFR calc Af Amer: 71 mL/min — ABNORMAL LOW (ref >90)
GFR calc non Af Amer: 62 mL/min — ABNORMAL LOW (ref >90)
Glucose, Bld: 113 mg/dL — ABNORMAL HIGH (ref 70–99)
Potassium: 3.9 mEq/L (ref 3.5–5.1)
Sodium: 135 mEq/L (ref 135–145)

## 2012-03-31 LAB — CBC WITH DIFFERENTIAL/PLATELET
Basophils Absolute: 0 10*3/uL (ref 0.0–0.1)
Basophils Relative: 1 % (ref 0–1)
Eosinophils Absolute: 0.3 10*3/uL (ref 0.0–0.7)
Eosinophils Relative: 4 % (ref 0–5)
HCT: 41.6 % (ref 39.0–52.0)
Hemoglobin: 14.7 g/dL (ref 13.0–17.0)
Lymphocytes Relative: 30 % (ref 12–46)
Lymphs Abs: 1.9 10*3/uL (ref 0.7–4.0)
MCH: 33.5 pg (ref 26.0–34.0)
MCHC: 35.3 g/dL (ref 30.0–36.0)
MCV: 94.8 fL (ref 78.0–100.0)
Monocytes Absolute: 0.5 10*3/uL (ref 0.1–1.0)
Monocytes Relative: 8 % (ref 3–12)
Neutro Abs: 3.6 10*3/uL (ref 1.7–7.7)
Neutrophils Relative %: 58 % (ref 43–77)
Platelets: 189 10*3/uL (ref 150–400)
RBC: 4.39 MIL/uL (ref 4.22–5.81)
RDW: 12.1 % (ref 11.5–15.5)
WBC: 6.3 10*3/uL (ref 4.0–10.5)

## 2012-03-31 LAB — TROPONIN I: Troponin I: <0.3 ng/mL (ref <0.30)

## 2012-03-31 MED ORDER — NITROGLYCERIN 0.4 MG SL SUBL: 0.4000 mg | SUBLINGUAL_TABLET | SUBLINGUAL | Status: DC | PRN

## 2012-03-31 NOTE — ED Provider Notes (Signed)
History  This chart was scribed for Michael Hutching, MD by Erskine Emery. This patient was seen in room APA19/APA19 and the patient's care was started at 15:50.   CSN: [id number]  Arrival date & time 03/31/12  1526   First MD Initiated Contact with Patient 03/31/12 1550      Chief Complaint  Patient presents with  . Arm Pain    (Consider location/radiation/quality/duration/timing/severity/associated sxs/prior treatment) The history is provided by the patient. No language interpreter was used.  Michael Cortez is a 56 y.o. male who presents to the Emergency Department complaining of intermittent left neck and left arm numbness since waking this morning that worsened around 2 pm today but has since improved a bit (still present in the forearm). Pt also reports some left arm weakness, mild nausea, and about 2-3 fleeting episodes of chest pain lasting about 30 seconds but denies any SOB. Pt was recently admitted here for about 24 hours for chest pain and reports he had some similar arm tingling during that episode. While admitted the pt saw Dr. [place] Bing who gave him a stress test (that was negative) and sent him home Thursday evening (4 days ago). Pt has been taking aspirin and cholesterol medication since then. 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 Pt was smoking cigarettes until last week and now smokes vapor cigarettes. Pt also reports he quit drinking 30 years ago. Pt has no PCP because he has no insurance.   Past Medical History  Diagnosis Date  . Anxiety   . Chest pain 03/26/2012.    Nuclear stress test negative. MI ruled out.  Marland Kitchen Hyperlipidemia   . Tobacco abuse     Past Surgical History  Procedure Date  . Appendectomy     History reviewed. No pertinent family history.  History  Substance Use Topics  . Smoking status: Current Everyday Smoker -- 1.0 packs/day  . Smokeless tobacco: Not on file  . Alcohol Use: No       Review of Systems  Constitutional: Negative for fever and chills.  Respiratory: Negative for shortness of breath.   Cardiovascular: Positive for chest pain.  Gastrointestinal: Positive for nausea. Negative for vomiting.  Neurological: Positive for weakness and numbness.  All other systems reviewed and are negative.    Allergies  Review of patient's allergies indicates no known allergies.  Home Medications   Current Outpatient Rx  Name Route Sig Dispense Refill  . ASPIRIN 325 MG PO TBEC Oral Take 325 mg by mouth daily.    Marland Kitchen PRAVASTATIN SODIUM 20 MG PO TABS Oral Take 1 tablet (20 mg total) by mouth daily. 30 tablet 3    Triage Vitals: BP 149/81  Pulse 71  Temp 98.6 F (37 C) (Oral)  Resp 18  Ht 5\' 4"  (1.626 m)  Wt 174 lb (78.926 kg)  BMI 29.87 kg/m2  SpO2 98%  Physical Exam  Nursing note and vitals reviewed. Constitutional: He is oriented to person, place, and time. He appears well-developed and well-nourished. No distress.       Pt does not look ill, his color is good.  HENT:  Head: Normocephalic and atraumatic.  Eyes: Conjunctivae and EOM are normal.  Neck: Normal range of motion.  Cardiovascular: Normal rate and regular rhythm.   Pulmonary/Chest: Effort normal and breath sounds normal.  Abdominal: Soft.  Musculoskeletal: Normal range of motion.  Neurological: He is alert and oriented to person, place, and time.  Skin:  Skin is warm and dry.  Psychiatric: He has a normal mood and affect.    ED Course  Procedures (including critical care time) DIAGNOSTIC STUDIES: Oxygen Saturation is 98% on room air, normal by my interpretation.    COORDINATION OF CARE: 16:16--I evaluated the patient and we discussed a treatment plan including cardiac enzyme analysis and blood work to which the pt agreed. I informed the pt that I do not think he had a stroke or heart attack.   Labs Reviewed  BASIC METABOLIC PANEL - Abnormal; Notable for the following:    Glucose, Bld  113 (*)     GFR calc non Af Amer 62 (*)     GFR calc Af Amer 71 (*)     All other components within normal limits  CBC WITH DIFFERENTIAL  TROPONIN I     Date: 03/31/2012  Rate: 62  Rhythm: normal sinus rhythm  QRS Axis: normal  Intervals: normal  ST/T Wave abnormalities: normal  Conduction Disutrbances: none  Narrative Interpretation: unremarkable    No results found.   No diagnosis found.    MDM  Patient admitted to hospital recently for chest pain. Stress test performed by Dr. Dietrich Pates essentially negative.  Normal EKG and troponin today.  discussed with Dr. Dietrich Pates.  Will Rx nitroglycerin and referr to cardiologist      I personally performed the services described in this documentation, which was scribed in my presence. The recorded information has been reviewed and considered.    Michael Hutching, MD 04/12/12 [phone]

## 2012-03-31 NOTE — ED Notes (Signed)
Pt given appointment  With Dr. Dietrich Pates for 04-07-12 at 11 am.

## 2012-03-31 NOTE — ED Notes (Signed)
Pain /numbness lt arm .  Released from hospital 8/15 after evalulation of chest pain.

## 2012-03-31 NOTE — ED Notes (Signed)
Pt reports waking this am (around 5am) having left arm pain. Denies any n/v/d or fever, no sob. Was just d/c from hospital last week was treated for chest pain, had cardiac cath. And full work-up.  Is pain free currently and at arrival.  Pt alert and oriented. Color normal. Vss.

## 2012-04-07 ENCOUNTER — Encounter: Payer: Self-pay | Admitting: Cardiology

## 2012-04-07 ENCOUNTER — Encounter: Payer: Self-pay | Admitting: *Deleted

## 2012-04-07 ENCOUNTER — Ambulatory Visit (INDEPENDENT_AMBULATORY_CARE_PROVIDER_SITE_OTHER): Payer: Self-pay | Admitting: Cardiology

## 2012-04-07 VITALS — BP 140/82 | HR 70 | Ht 64.0 in | Wt 186.0 lb

## 2012-04-07 DIAGNOSIS — F17200 Nicotine dependence, unspecified, uncomplicated: Secondary | ICD-10-CM

## 2012-04-07 DIAGNOSIS — R079 Chest pain, unspecified: Secondary | ICD-10-CM

## 2012-04-07 DIAGNOSIS — E785 Hyperlipidemia, unspecified: Secondary | ICD-10-CM

## 2012-04-07 DIAGNOSIS — Z720 Tobacco use: Secondary | ICD-10-CM

## 2012-04-07 DIAGNOSIS — Z0189 Encounter for other specified special examinations: Secondary | ICD-10-CM | POA: Insufficient documentation

## 2012-04-07 MED ORDER — PRAVASTATIN SODIUM 40 MG PO TABS: 40.0000 mg | ORAL_TABLET | ORAL | Status: DC

## 2012-04-07 NOTE — Assessment & Plan Note (Signed)
Patient congratulated on substantial decrease in tobacco consumption and on his decision to quit entirely.

## 2012-04-07 NOTE — Assessment & Plan Note (Signed)
Pravastatin could be causing his mental clouding, but this is unlikely.  He requires at least a 30% decrease in total and LDL cholesterol, which may not be achievable by use of this agent, certainly not at low dose.  He is advised to increase to 40 mg per day with a repeat lipid profile in one month.

## 2012-04-07 NOTE — Assessment & Plan Note (Addendum)
Atypical chest discomfort continues.  We have no evidence that these symptoms are of cardiac origin, and a GI or musculoskeletal cause appears more likely.  He will be treated symptomatically with over-the-counter analgesics and will call for any intensification of discomfort.  As the result of a history of nonobstructive coronary disease, we will attempt to optimize control of cardiovascular risk factors.

## 2012-04-07 NOTE — Patient Instructions (Addendum)
Your physician recommends that you schedule a follow-up appointment in: 5 - 6 weeks  Your physician recommends that you schedule a follow-up appointment in:  1 - INCREASE Pravachol to 40 mg daily 2 - DECREASE Aspirin to 81 mg daily 3 - May take Tylenol 650 mg every 8 hours as needed  Your physician recommends that you return for lab work in: 1 month (you will receive a reminder letter)  STOP Smoking

## 2012-04-07 NOTE — Progress Notes (Signed)
Patient ID: Michael Cortez, male   DOB: March 22, 1956, 56 y.o.   MRN: [id number]  HPI: Scheduled return visit for this nice gentleman who was recently admitted to hospital with chest discomfort.  Myocardial infarction was ruled out, and a stress nuclear study was negative.  Nonetheless, he is continued to be symptomatic with frequent brief episodes of left upper chest discomfort, some discomfort in the left arm and exertional dyspnea.  Chest discomfort is not necessarily related to activity nor any other factor that the patient can identify, and there are no associated symptoms.  Discomfort passes spontaneously within  1 minute.  He also has noted some mental clouding.  Tobacco use has been decreased to 1-2 cigarettes per day, and patient and his wife are both committed to quitting entirely.  Prior to Admission medications   Medication Sig Start Date End Date Taking? Authorizing Provider  acetaminophen (TYLENOL) 650 MG CR tablet Take 650 mg by mouth every 8 (eight) hours as needed.   Yes Historical Provider, MD  aspirin 81 MG tablet Take 81 mg by mouth daily.   Yes Historical Provider, MD  nitroGLYCERIN (NITROSTAT) 0.4 MG SL tablet Place 1 tablet (0.4 mg total) under the tongue every 5 (five) minutes as needed for chest pain. 03/31/12 03/31/13 Yes Donnetta Hutching, MD  pravastatin (PRAVACHOL) 40 MG tablet Take 1 tablet (40 mg total) by mouth every morning. 04/07/12 04/07/13 Yes Kathlen Brunswick, MD   No Known Allergies    Past medical history, social history, and family history reviewed and updated.  ROS: Denies orthopnea, PND, pedal edema, claudication, lightheadedness or syncope.  All other systems reviewed and are negative.  PHYSICAL EXAM: BP 140/82  Pulse 70  Ht 5\' 4"  (1.626 m)  Wt 84.369 kg (186 lb)  BMI 31.93 kg/m2  General-Well developed; no acute distress Body habitus-Overweight Neck-No JVD; no carotid bruits Lungs-clear lung fields; resonant to percussion Cardiovascular-normal PMI; normal S1  and S2; S4 present Abdomen-normal bowel sounds; soft and non-tender without masses or organomegaly Musculoskeletal-No deformities, no cyanosis or clubbing Neurologic-Normal cranial nerves; symmetric strength and tone Skin-Warm, no significant lesions Extremities-distal pulses 1-2+; no edema  EKG: Normal sinus rhythm; minimal nondiagnostic inferior Q waves; borderline left atrial abnormality; otherwise normal.  ASSESSMENT AND PLAN:  [place] Bing, MD 04/07/2012 12:07 PM

## 2012-04-07 NOTE — Progress Notes (Deleted)
Name: ZIV WELCHEL    DOB: 1956-03-25  Age: 56 y.o.  MR#: [id number]       PCP:  No primary provider on file.      Insurance: @PAYORNAME @   CC:    Chief Complaint  Patient presents with  . Chest Pain    (-) list or meds    VS BP 140/82  Pulse 70  Ht 5\' 4"  (1.626 m)  Wt 186 lb (84.369 kg)  BMI 31.93 kg/m2  Weights Current Weight  04/07/12 186 lb (84.369 kg)  03/31/12 174 lb (78.926 kg)  03/26/12 178 lb (80.74 kg)    Blood Pressure  BP Readings from Last 3 Encounters:  04/07/12 140/82  03/31/12 149/81  03/27/12 116/69     Admit date:  (Not on file) Last encounter with RMR:  Visit date not found   Allergy No Known Allergies  Current Outpatient Prescriptions  Medication Sig Dispense Refill  . aspirin 325 MG EC tablet Take 325 mg by mouth every morning.       . nitroGLYCERIN (NITROSTAT) 0.4 MG SL tablet Place 1 tablet (0.4 mg total) under the tongue every 5 (five) minutes as needed for chest pain.  30 tablet  0  . pravastatin (PRAVACHOL) 20 MG tablet Take 20 mg by mouth every morning.        Discontinued Meds:   There are no discontinued medications.  Patient Active Problem List  Diagnosis  . Tobacco abuse  . Bradycardia  . Hyperlipidemia  . Chest pain  . Laboratory test    LABS Admission on 03/31/2012, Discharged on 03/31/2012  Component Date Value  . WBC 03/31/2012 6.3   . RBC 03/31/2012 4.39   . Hemoglobin 03/31/2012 14.7   . HCT 03/31/2012 41.6   . MCV 03/31/2012 94.8   . [facility] 03/31/2012 33.5   . MCHC 03/31/2012 35.3   . RDW 03/31/2012 12.1   . Platelets 03/31/2012 189   . Neutrophils Relative 03/31/2012 58   . Neutro Abs 03/31/2012 3.6   . Lymphocytes Relative 03/31/2012 30   . Lymphs Abs 03/31/2012 1.9   . Monocytes Relative 03/31/2012 8   . Monocytes Absolute 03/31/2012 0.5   . Eosinophils Relative 03/31/2012 4   . Eosinophils Absolute 03/31/2012 0.3   . Basophils Relative 03/31/2012 1   . Basophils Absolute 03/31/2012 0.0   . Sodium  03/31/2012 135   . Potassium 03/31/2012 3.9   . Chloride 03/31/2012 100   . CO2 03/31/2012 24   . Glucose, Bld 03/31/2012 113*  . BUN 03/31/2012 10   . Creatinine, Ser 03/31/2012 1.27   . Calcium 03/31/2012 9.4   . GFR calc non Af Amer 03/31/2012 62*  . GFR calc Af Amer 03/31/2012 71*  . Troponin I 03/31/2012 <0.30   Admission on 03/26/2012, Discharged on 03/27/2012  Component Date Value  . WBC 03/26/2012 6.7   . RBC 03/26/2012 4.41   . Hemoglobin 03/26/2012 15.0   . HCT 03/26/2012 41.8   . MCV 03/26/2012 94.8   . [facility] 03/26/2012 34.0   . MCHC 03/26/2012 35.9   . RDW 03/26/2012 12.2   . Platelets 03/26/2012 187   . Neutrophils Relative 03/26/2012 57   . Neutro Abs 03/26/2012 3.8   . Lymphocytes Relative 03/26/2012 33   . Lymphs Abs 03/26/2012 2.2   . Monocytes Relative 03/26/2012 6   . Monocytes Absolute 03/26/2012 0.4   . Eosinophils Relative 03/26/2012 4   . Eosinophils Absolute 03/26/2012 0.2   .  Basophils Relative 03/26/2012 0   . Basophils Absolute 03/26/2012 0.0   . Sodium 03/26/2012 138   . Potassium 03/26/2012 4.1   . Chloride 03/26/2012 101   . CO2 03/26/2012 28   . Glucose, Bld 03/26/2012 93   . BUN 03/26/2012 8   . Creatinine, Ser 03/26/2012 1.19   . Calcium 03/26/2012 9.3   . GFR calc non Af Amer 03/26/2012 67*  . GFR calc Af Amer 03/26/2012 77*  . Troponin I 03/26/2012 <0.30   . D-Dimer, Quant 03/26/2012 0.52*  . Total CK 03/26/2012 133   . CK, MB 03/26/2012 2.5   . Troponin I 03/26/2012 <0.30   . Relative Index 03/26/2012 1.9   . Total CK 03/26/2012 125   . CK, MB 03/26/2012 2.3   . Troponin I 03/26/2012 <0.30   . Relative Index 03/26/2012 1.8   . Cholesterol 03/27/2012 228*  . Triglycerides 03/27/2012 115   . HDL 03/27/2012 58   . Total CHOL/HDL Ratio 03/27/2012 3.9   . VLDL 03/27/2012 23   . LDL Cholesterol 03/27/2012 147*  . TSH 03/27/2012 1.463   . Free T4 03/27/2012 1.13      Results for this Opt Visit:     Results for orders placed  during the hospital encounter of 03/31/12  CBC WITH DIFFERENTIAL      Component Value Range   WBC 6.3  4.0 - 10.5 K/uL   RBC 4.39  4.22 - 5.81 MIL/uL   Hemoglobin 14.7  13.0 - 17.0 g/dL   HCT 16.1  09.6 - 04.5 %   MCV 94.8  78.0 - 100.0 fL   MCH 33.5  26.0 - 34.0 pg   MCHC 35.3  30.0 - 36.0 g/dL   RDW 40.9  81.1 - 91.4 %   Platelets 189  150 - 400 K/uL   Neutrophils Relative 58  43 - 77 %   Neutro Abs 3.6  1.7 - 7.7 K/uL   Lymphocytes Relative 30  12 - 46 %   Lymphs Abs 1.9  0.7 - 4.0 K/uL   Monocytes Relative 8  3 - 12 %   Monocytes Absolute 0.5  0.1 - 1.0 K/uL   Eosinophils Relative 4  0 - 5 %   Eosinophils Absolute 0.3  0.0 - 0.7 K/uL   Basophils Relative 1  0 - 1 %   Basophils Absolute 0.0  0.0 - 0.1 K/uL  BASIC METABOLIC PANEL      Component Value Range   Sodium 135  135 - 145 mEq/L   Potassium 3.9  3.5 - 5.1 mEq/L   Chloride 100  96 - 112 mEq/L   CO2 24  19 - 32 mEq/L   Glucose, Bld 113 (*) 70 - 99 mg/dL   BUN 10  6 - 23 mg/dL   Creatinine, Ser 7.82  0.50 - 1.35 mg/dL   Calcium 9.4  8.4 - 95.6 mg/dL   GFR calc non Af Amer 62 (*) >90 mL/min   GFR calc Af Amer 71 (*) >90 mL/min  TROPONIN I      Component Value Range   Troponin I <0.30  <0.30 ng/mL    EKG Orders placed during the hospital encounter of 03/31/12  . ED EKG  . ED EKG  . EKG     Prior Assessment and Plan Problem List as of 04/07/2012            Cardiology Problems   Hyperlipidemia     Other  Tobacco abuse   Bradycardia   Chest pain   Laboratory test       Imaging: Dg Chest 2 View  03/26/2012  *RADIOLOGY REPORT*  Clinical Data: Chest pain and shortness of breath.  CHEST - 2 VIEW  Comparison: 12/20/2003  Findings: The lungs are clear without focal infiltrate, edema, pneumothorax or pleural effusion. The cardiopericardial silhouette is within normal limits for size. Imaged bony structures of the thorax are intact. Telemetry leads overlie the chest.  IMPRESSION: Stable.  No acute findings.   Original Report Authenticated By: ERIC A. MANSELL, M.D.   Ct Angio Chest Pe W/cm &/or Wo Cm  03/26/2012  *RADIOLOGY REPORT*  Clinical Data: Chest pain.  Positive D-dimer.  CT ANGIOGRAPHY CHEST  Technique:  Multidetector CT imaging of the chest using the standard protocol during bolus administration of intravenous contrast. Multiplanar reconstructed images including MIPs were obtained and reviewed to evaluate the vascular anatomy.  Contrast: OMNIPAQUE IOHEXOL 350 MG/ML SOLN  Comparison: Plain films of the chest 03/26/2012.  Findings: No pulmonary embolus is identified.  There is no axillary, hilar or mediastinal lymphadenopathy.  Heart size is normal.  No pleural or pericardial effusion.  The lungs are clear. Incidentally imaged upper abdomen is unremarkable.  There is scattered degenerative disease in the lower thoracic spine.  No focal bony abnormality.  IMPRESSION: Negative for pulmonary embolus.  No acute finding.  Original Report Authenticated By: Bernadene Bell. Maricela Curet, M.D.   Nm Myocar Single W/spect W/wall Motion And Ef  03/28/2012  Ordering Physician: [place] Bing  Reading Physician: [place] Bing  Clinical Data: 56 year old gentleman admitted to hospital with chest discomfort.  NUCLEAR MEDICINE STRESS MYOVIEW STUDY WITH SPECT AND LEFT VENTRICULAR EJECTION FRACTION  Radionuclide Data: One-day rest/stress protocol performed with 10/30 mCi of Tc-72m Myoview.  Stress Data: Treadmill exercise performed to a workload of 10.1 mets and a heart rate of 142, 86% of age predicted maximum. Exercise discontinued due to leg fatigue; no chest discomfort reported.  Blood pressure increased from a resting value of 135/75 to 195/65 during exercise, a marginally hypertensive response.  No arrhythmias noted.  EKG: Normal sinus rhythm; prominent QRS voltage; somewhat delayed R- wave progression; otherwise normal. Stress EKG:  Upsloping and flat ST-segment depression first noted towards the end of stage II of the  protocol reaching a maximum of 1.8 - 2.3 mm in the inferior leads and resolving rapidly in recovery.  Scintigraphic Data: Acquisition notable for mild diaphragmatic attenuation.  Left ventricular size was normal.  On tomographic images reconstructed in standard planes, there was thinning of the inferoseptal segment that was of borderline numeric significance and for which no reversibility was apparent.  The gated reconstruction demonstrated normal regional and global LV systolic function as well as normal systolic accentuation of activity throughout.  Estimated ejection fraction was 59%.  IMPRESSION: Negative stress nuclear myocardial study revealing adequate exercise tolerance, a borderline positive stress EKG but scintigraphic images revealing no convincing evidence for ischemia or infarction.  Other findings as noted.  Original Report Authenticated By: Raelyn Mora Calculation: Score not calculated. Missing: Total Cholesterol

## 2012-05-01 ENCOUNTER — Other Ambulatory Visit: Payer: Self-pay | Admitting: Cardiology

## 2012-05-02 LAB — LIPID PANEL
Cholesterol: 161 mg/dL (ref 0–200)
HDL: 55 mg/dL (ref >39)
LDL Cholesterol: 93 mg/dL (ref 0–99)
Total CHOL/HDL Ratio: 2.9 Ratio
Triglycerides: 66 mg/dL (ref <150)
VLDL: 13 mg/dL (ref 0–40)

## 2012-05-07 ENCOUNTER — Encounter: Payer: Self-pay | Admitting: *Deleted

## 2012-05-08 ENCOUNTER — Encounter: Payer: Self-pay | Admitting: *Deleted

## 2012-05-08 ENCOUNTER — Ambulatory Visit (INDEPENDENT_AMBULATORY_CARE_PROVIDER_SITE_OTHER): Payer: Self-pay | Admitting: Cardiology

## 2012-05-08 ENCOUNTER — Encounter: Payer: Self-pay | Admitting: Cardiology

## 2012-05-08 VITALS — BP 145/72 | HR 79 | Ht 64.0 in | Wt 187.1 lb

## 2012-05-08 DIAGNOSIS — E785 Hyperlipidemia, unspecified: Secondary | ICD-10-CM

## 2012-05-08 DIAGNOSIS — Z720 Tobacco use: Secondary | ICD-10-CM

## 2012-05-08 DIAGNOSIS — R079 Chest pain, unspecified: Secondary | ICD-10-CM

## 2012-05-08 DIAGNOSIS — F17200 Nicotine dependence, unspecified, uncomplicated: Secondary | ICD-10-CM

## 2012-05-08 NOTE — Assessment & Plan Note (Signed)
Symptoms have improved spontaneously.  We will continue appropriate treatment for presumed mild coronary disease.

## 2012-05-08 NOTE — Assessment & Plan Note (Signed)
Patient is considering an attempt to replace cigarette smoking with an electronic nicotine delivery device.  I encouraged him to do so.

## 2012-05-08 NOTE — Progress Notes (Signed)
Patient ID: Michael Cortez, male   DOB: 11-08-1955, 56 y.o.   MRN: [id number]  HPI: Scheduled return visit for this nice gentleman with chest pain and hyperlipidemia.  Since his last visit, chest discomfort has improved.  He notes only rare and brief episodes at present.  He reports the interval development of fatigue and myalgias, particularly in the upper extremities.  This has occurred in conjunction with initiation of lipid-lowering therapy; however, he tolerated rosuvastatin in the past over the course of at least a year.  Prior to Admission medications   Medication Sig Start Date End Date Taking? Authorizing Provider  acetaminophen (TYLENOL) 650 MG CR tablet Take 650 mg by mouth every 8 (eight) hours as needed.   Yes Historical Provider, MD  aspirin 81 MG tablet Take 81 mg by mouth daily.   Yes Historical Provider, MD  nitroGLYCERIN (NITROSTAT) 0.4 MG SL tablet Place 1 tablet (0.4 mg total) under the tongue every 5 (five) minutes as needed for chest pain. 03/31/12 03/31/13 Yes Donnetta Hutching, MD  No Known Allergies    Past medical history, social history, and family history reviewed and updated.  ROS: Denies orthopnea, PND, lightheadedness or syncope.  PHYSICAL EXAM: BP 145/72  Pulse 79  Ht 5\' 4"  (1.626 m)  Wt 84.877 kg (187 lb 1.9 oz)  BMI 32.12 kg/m2  SpO2 99%  General-Well developed; no acute distress Body habitus-Mildly overweight Neck-No JVD; no carotid bruits Lungs-Few rhonchi at the right base; clear with cough; resonant to percussion; normal I-E ratio Cardiovascular-normal PMI; normal S1 and S2; modest systolic ejection murmur Abdomen-normal bowel sounds; soft and non-tender without masses or organomegaly Musculoskeletal-No deformities, no cyanosis or clubbing Neurologic-Normal cranial nerves; symmetric strength and tone Skin-Warm, no significant lesions Extremities-distal pulses intact; no edema  ASSESSMENT AND PLAN:  [place] Bing, MD 05/08/2012 12:24 PM

## 2012-05-08 NOTE — Progress Notes (Deleted)
Name: Michael Cortez    DOB: September 11, 1955  Age: 55 y.o.  MR#: [id number]       PCP:  No primary provider on file.      Insurance: @PAYORNAME @   CC:   No chief complaint on file.   VS BP 145/72  Pulse 79  Ht 5\' 4"  (1.626 m)  Wt 187 lb 1.9 oz (84.877 kg)  BMI 32.12 kg/m2  SpO2 99%  Weights Current Weight  05/08/12 187 lb 1.9 oz (84.877 kg)  04/07/12 186 lb (84.369 kg)  03/31/12 174 lb (78.926 kg)    Blood Pressure  BP Readings from Last 3 Encounters:  05/08/12 145/72  04/07/12 140/82  03/31/12 149/81     Admit date:  (Not on file) Last encounter with RMR:  05/01/2012   Allergy No Known Allergies  Current Outpatient Prescriptions  Medication Sig Dispense Refill  . acetaminophen (TYLENOL) 650 MG CR tablet Take 650 mg by mouth every 8 (eight) hours as needed.      Marland Kitchen aspirin 81 MG tablet Take 81 mg by mouth daily.      . nitroGLYCERIN (NITROSTAT) 0.4 MG SL tablet Place 1 tablet (0.4 mg total) under the tongue every 5 (five) minutes as needed for chest pain.  30 tablet  0  . pravastatin (PRAVACHOL) 40 MG tablet Take 1 tablet (40 mg total) by mouth every morning.  30 tablet  11    Discontinued Meds:   There are no discontinued medications.  Patient Active Problem List  Diagnosis  . Tobacco abuse  . Hyperlipidemia  . Chest pain  . Laboratory test    LABS Orders Only on 05/01/2012  Component Date Value  . Cholesterol 05/01/2012 161   . Triglycerides 05/01/2012 66   . HDL 05/01/2012 55   . Total CHOL/HDL Ratio 05/01/2012 2.9   . VLDL 05/01/2012 13   . LDL Cholesterol 05/01/2012 93   Admission on 03/31/2012, Discharged on 03/31/2012  Component Date Value  . WBC 03/31/2012 6.3   . RBC 03/31/2012 4.39   . Hemoglobin 03/31/2012 14.7   . HCT 03/31/2012 41.6   . MCV 03/31/2012 94.8   . [facility] 03/31/2012 33.5   . MCHC 03/31/2012 35.3   . RDW 03/31/2012 12.1   . Platelets 03/31/2012 189   . Neutrophils Relative 03/31/2012 58   . Neutro Abs 03/31/2012 3.6   .  Lymphocytes Relative 03/31/2012 30   . Lymphs Abs 03/31/2012 1.9   . Monocytes Relative 03/31/2012 8   . Monocytes Absolute 03/31/2012 0.5   . Eosinophils Relative 03/31/2012 4   . Eosinophils Absolute 03/31/2012 0.3   . Basophils Relative 03/31/2012 1   . Basophils Absolute 03/31/2012 0.0   . Sodium 03/31/2012 135   . Potassium 03/31/2012 3.9   . Chloride 03/31/2012 100   . CO2 03/31/2012 24   . Glucose, Bld 03/31/2012 113*  . BUN 03/31/2012 10   . Creatinine, Ser 03/31/2012 1.27   . Calcium 03/31/2012 9.4   . GFR calc non Af Amer 03/31/2012 62*  . GFR calc Af Amer 03/31/2012 71*  . Troponin I 03/31/2012 <0.30   Admission on 03/26/2012, Discharged on 03/27/2012  Component Date Value  . WBC 03/26/2012 6.7   . RBC 03/26/2012 4.41   . Hemoglobin 03/26/2012 15.0   . HCT 03/26/2012 41.8   . MCV 03/26/2012 94.8   . [facility] 03/26/2012 34.0   . MCHC 03/26/2012 35.9   . RDW 03/26/2012 12.2   . Platelets 03/26/2012  187   . Neutrophils Relative 03/26/2012 57   . Neutro Abs 03/26/2012 3.8   . Lymphocytes Relative 03/26/2012 33   . Lymphs Abs 03/26/2012 2.2   . Monocytes Relative 03/26/2012 6   . Monocytes Absolute 03/26/2012 0.4   . Eosinophils Relative 03/26/2012 4   . Eosinophils Absolute 03/26/2012 0.2   . Basophils Relative 03/26/2012 0   . Basophils Absolute 03/26/2012 0.0   . Sodium 03/26/2012 138   . Potassium 03/26/2012 4.1   . Chloride 03/26/2012 101   . CO2 03/26/2012 28   . Glucose, Bld 03/26/2012 93   . BUN 03/26/2012 8   . Creatinine, Ser 03/26/2012 1.19   . Calcium 03/26/2012 9.3   . GFR calc non Af Amer 03/26/2012 67*  . GFR calc Af Amer 03/26/2012 77*  . Troponin I 03/26/2012 <0.30   . D-Dimer, Quant 03/26/2012 0.52*  . Total CK 03/26/2012 133   . CK, MB 03/26/2012 2.5   . Troponin I 03/26/2012 <0.30   . Relative Index 03/26/2012 1.9   . Total CK 03/26/2012 125   . CK, MB 03/26/2012 2.3   . Troponin I 03/26/2012 <0.30   . Relative Index 03/26/2012 1.8   .  Cholesterol 03/27/2012 228*  . Triglycerides 03/27/2012 115   . HDL 03/27/2012 58   . Total CHOL/HDL Ratio 03/27/2012 3.9   . VLDL 03/27/2012 23   . LDL Cholesterol 03/27/2012 147*  . TSH 03/27/2012 1.463   . Free T4 03/27/2012 1.13      Results for this Opt Visit:     Results for orders placed in visit on 05/01/12  LIPID PANEL      Component Value Range   Cholesterol 161  0 - 200 mg/dL   Triglycerides 66  <161 mg/dL   HDL 55  >09 mg/dL   Total CHOL/HDL Ratio 2.9     VLDL 13  0 - 40 mg/dL   LDL Cholesterol 93  0 - 99 mg/dL    EKG Orders placed in visit on 04/07/12  . EKG 12-LEAD     Prior Assessment and Plan Problem List as of 05/08/2012            Cardiology Problems   Hyperlipidemia   Last Assessment & Plan Note   04/07/2012 Office Visit Signed 04/07/2012 12:14 PM by Kathlen Brunswick, MD    Pravastatin could be causing his mental clouding, but this is unlikely.  He requires at least a 30% decrease in total and LDL cholesterol, which may not be achievable by use of this agent, certainly not at low dose.  He is advised to increase to 40 mg per day with a repeat lipid profile in one month.      Other   Tobacco abuse   Last Assessment & Plan Note   04/07/2012 Office Visit Signed 04/07/2012 12:15 PM by Kathlen Brunswick, MD    Patient congratulated on substantial decrease in tobacco consumption and on his decision to quit entirely.    Chest pain   Last Assessment & Plan Note   04/07/2012 Office Visit Addendum 04/07/2012 12:13 PM by Kathlen Brunswick, MD    Atypical chest discomfort continues.  We have no evidence that these symptoms are of cardiac origin, and a GI or musculoskeletal cause appears more likely.  He will be treated symptomatically with over-the-counter analgesics and will call for any intensification of discomfort.  As the result of a history of nonobstructive coronary disease, we will attempt  to optimize control of cardiovascular risk factors.    Laboratory  test       Imaging: No results found.   FRS Calculation: Score not calculated. Missing: Total Cholesterol

## 2012-05-08 NOTE — Patient Instructions (Addendum)
Your physician recommends that you schedule a follow-up appointment in: 2 -3 months  Your physician recommends that you return for lab work in: 6 weeks (you will receive a letter)  Low salt diet - information included  Your physician has recommended you make the following change in your medication: STOP Pravastatin

## 2012-05-08 NOTE — Assessment & Plan Note (Signed)
Patient has had an excellent response to pravastatin, but has developed symptoms that may well reflect an adverse effect to this medication.  Pravastatin will be discontinued for the time being and symptoms reassessed in 2-3 weeks.  If improved, we will persist with a trial of a heart healthy diet and reevaluate the need for pharmacologic therapy in 2 months.

## 2012-06-20 ENCOUNTER — Other Ambulatory Visit: Payer: Self-pay | Admitting: *Deleted

## 2012-06-20 DIAGNOSIS — E785 Hyperlipidemia, unspecified: Secondary | ICD-10-CM

## 2012-06-20 DIAGNOSIS — Z720 Tobacco use: Secondary | ICD-10-CM

## 2012-06-20 DIAGNOSIS — R079 Chest pain, unspecified: Secondary | ICD-10-CM

## 2012-06-26 ENCOUNTER — Encounter: Payer: Self-pay | Admitting: *Deleted

## 2012-07-01 ENCOUNTER — Ambulatory Visit: Payer: Self-pay | Admitting: Cardiology

## 2013-03-12 ENCOUNTER — Ambulatory Visit: Payer: Self-pay | Admitting: Physician Assistant

## 2013-03-12 VITALS — BP 150/80 | HR 66 | Temp 98.5°F | Ht 65.0 in | Wt 194.0 lb

## 2013-03-12 DIAGNOSIS — R5383 Other fatigue: Secondary | ICD-10-CM

## 2013-03-12 DIAGNOSIS — R109 Unspecified abdominal pain: Secondary | ICD-10-CM

## 2013-03-12 DIAGNOSIS — W57XXXA Bitten or stung by nonvenomous insect and other nonvenomous arthropods, initial encounter: Secondary | ICD-10-CM

## 2013-03-12 DIAGNOSIS — R5381 Other malaise: Secondary | ICD-10-CM

## 2013-03-12 DIAGNOSIS — T148 Other injury of unspecified body region: Secondary | ICD-10-CM

## 2013-03-12 LAB — POCT CBC
Granulocyte percent: 63.3 %G (ref 37–80)
HCT, POC: 45.9 % (ref 43.5–53.7)
Hemoglobin: 15.3 g/dL (ref 14.1–18.1)
Lymph, poc: 2 (ref 0.6–3.4)
MCH, POC: 32.3 pg — AB (ref 27–31.2)
MCHC: 33.4 g/dL (ref 31.8–35.4)
MCV: 96.8 fL (ref 80–97)
MPV: 8.4 fL (ref 0–99.8)
POC Granulocyte: 3.7 (ref 2–6.9)
POC LYMPH PERCENT: 35 %L (ref 10–50)
Platelet Count, POC: 216 10*3/uL (ref 142–424)
RBC: 4.7 M/uL (ref 4.69–6.13)
RDW, POC: 12.8 %
WBC: 5.8 10*3/uL (ref 4.6–10.2)

## 2013-03-12 LAB — GLUCOSE, POCT (MANUAL RESULT ENTRY): POC Glucose: 85 mg/dl (ref 70–99)

## 2013-03-12 LAB — POCT GLYCOSYLATED HEMOGLOBIN (HGB A1C): Hemoglobin A1C: 5.1

## 2013-03-12 NOTE — Progress Notes (Signed)
Subjective:    Patient ID: Michael Cortez, male    DOB: 1956-06-12, 57 y.o.   MRN: [id number]  HPI 57 y/o male presents with c/o worsening fatigue and weakness over the past couple of weeks. States that he has had two episodes of light headed and dizziness in the past 5 days and "felt like he was going to pass out" He states that he has had 3-4 tick bites over the past month. No aggravating or relieving factors. No out of the country travel or associated sick contacts.     Review of Systems  Constitutional: Positive for chills, activity change (lack of energy to do normal activities), fatigue and unexpected weight change (recent 8 lb weight loss over the past month).  HENT: Negative for hearing loss, ear pain, nosebleeds, congestion, sore throat, facial swelling, rhinorrhea, sneezing, drooling, mouth sores, trouble swallowing, neck pain, neck stiffness, dental problem, voice change, postnasal drip, sinus pressure, tinnitus and ear discharge.   Eyes: Negative for photophobia, pain, discharge, redness, itching and visual disturbance.  Respiratory: Negative for apnea, cough, choking, chest tightness, shortness of breath, wheezing and stridor.   Cardiovascular: Negative for chest pain, palpitations and leg swelling.  Gastrointestinal: Positive for abdominal pain (occasionaly feeling of "fullness"). Negative for nausea, vomiting, diarrhea, constipation and abdominal distention.  Endocrine: Positive for polydipsia and polyuria. Negative for cold intolerance and heat intolerance.  Genitourinary: Positive for frequency. Negative for dysuria, urgency, hematuria, flank pain, decreased urine volume, discharge, penile swelling, scrotal swelling, enuresis, difficulty urinating, genital sores, penile pain and testicular pain.  Skin: Negative for color change, pallor, rash and wound.  Neurological: Positive for weakness, light-headedness and headaches (Right temporal/frontal lobe x 2 days ago). Negative for  dizziness, tremors, seizures, syncope, facial asymmetry, speech difficulty and numbness.  Hematological: Negative for adenopathy. Does not bruise/bleed easily.  Psychiatric/Behavioral: Positive for sleep disturbance (history of sleep apnea. no longer wears cpap) and decreased concentration. Negative for behavioral problems, confusion and agitation.       Objective:   Physical Exam  Constitutional: He is oriented to person, place, and time. He appears well-developed and well-nourished. No distress.  HENT:  Head: Normocephalic and atraumatic.  Right Ear: External ear normal.  Left Ear: External ear normal.  Mouth/Throat: No oropharyngeal exudate.  Eyes: Conjunctivae and EOM are normal. Pupils are equal, round, and reactive to light. Right eye exhibits no discharge. Left eye exhibits no discharge. No scleral icterus.  Neck: No tracheal deviation present. No thyromegaly present.  Cardiovascular: Normal rate, regular rhythm and normal heart sounds.  Exam reveals no gallop and no friction rub.   No murmur heard. Pulmonary/Chest: Effort normal and breath sounds normal. No stridor. No respiratory distress. He has no wheezes. He has no rales. He exhibits no tenderness.  Abdominal: He exhibits no distension. There is no tenderness.  Neurological: He is alert and oriented to person, place, and time.  Skin: Skin is warm and dry. He is not diaphoretic.  Psychiatric: He has a normal mood and affect. His behavior is normal. Judgment and thought content normal.          Assessment & Plan:  1. Due to patient's recent history of tick bite and s/s of increased fatigue/weakness, we will run labs for RMSF and Lyme titers. Results on 03/15/13 were negative.  2. Due to patient's significant family hx of DM and heart disease, along with patient stated history of reported blockages x 3 years ago and uncontrolled medical hx of hyperlipidemia - CBC,  Hemoglobin A1C, lipid panel, EKG - Labs and EKG were WNL except  for Lipid panel. Patient stopped taking statin in the past due to being uninsured. I would like to start him on a statin and reassess him in 6-8 wks. I will call patient to discuss treating his hyperlipidemia.  3. Untreated sleep apnea: I also feel that this could be the origin of patient's increased fatigue. I discussed with him the importance of wearing the cpap and discussed repeating the sleep study if he continues to have symptoms.   Based on today's visit, clinical exam was negative. Due to patient being uninsured and private pay, I discussed with him that we would start with ruling out the more obvious causes by ordering CBC, CMP, Thyroid panel, Lyme and RMSF titers, lipid panel, Hemoglobin A1C. We will await results and treat accordingly. If patient continues to have symptoms, I explained to him that he may need to return to the cardiologist he was being seen by for further assessment.

## 2013-03-13 ENCOUNTER — Ambulatory Visit (INDEPENDENT_AMBULATORY_CARE_PROVIDER_SITE_OTHER): Payer: Self-pay | Admitting: Family Medicine

## 2013-03-13 VITALS — BP 146/82 | HR 73 | Temp 98.1°F | Ht 65.0 in | Wt 194.0 lb

## 2013-03-13 DIAGNOSIS — M67919 Unspecified disorder of synovium and tendon, unspecified shoulder: Secondary | ICD-10-CM

## 2013-03-13 DIAGNOSIS — R079 Chest pain, unspecified: Secondary | ICD-10-CM

## 2013-03-13 DIAGNOSIS — M719 Bursopathy, unspecified: Secondary | ICD-10-CM

## 2013-03-13 LAB — NMR LIPOPROFILE WITH LIPIDS
Cholesterol, Total: 207 mg/dL — ABNORMAL HIGH (ref <200)
HDL Particle Number: 33.1 umol/L (ref >=30.5)
HDL Size: 8.6 nm — ABNORMAL LOW (ref >=9.2)
HDL-C: 47 mg/dL (ref >=40)
LDL (calc): 143 mg/dL — ABNORMAL HIGH (ref <100)
LDL Particle Number: 2397 nmol/L — ABNORMAL HIGH (ref <1000)
LDL Size: 20.2 nm — ABNORMAL LOW (ref >20.5)
LP-IR Score: 44 (ref <=45)
Large HDL-P: 3.8 umol/L — ABNORMAL LOW (ref >=4.8)
Large VLDL-P: <0.8 nmol/L (ref <=2.7)
Small LDL Particle Number: 1520 nmol/L — ABNORMAL HIGH (ref <=527)
Triglycerides: 87 mg/dL (ref <150)
VLDL Size: 38 nm (ref <=46.6)

## 2013-03-13 LAB — COMPLETE METABOLIC PANEL WITH GFR
ALT: 9 U/L (ref 0–53)
AST: 11 U/L (ref 0–37)
Albumin: 4.5 g/dL (ref 3.5–5.2)
Alkaline Phosphatase: 75 U/L (ref 39–117)
BUN: 13 mg/dL (ref 6–23)
CO2: 27 mEq/L (ref 19–32)
Calcium: 9.2 mg/dL (ref 8.4–10.5)
Chloride: 104 mEq/L (ref 96–112)
Creat: 1.3 mg/dL (ref 0.50–1.35)
GFR, Est African American: 70 mL/min
GFR, Est Non African American: 61 mL/min
Glucose, Bld: 87 mg/dL (ref 70–99)
Potassium: 4.5 mEq/L (ref 3.5–5.3)
Sodium: 138 mEq/L (ref 135–145)
Total Bilirubin: 0.5 mg/dL (ref 0.3–1.2)
Total Protein: 6.6 g/dL (ref 6.0–8.3)

## 2013-03-13 MED ORDER — IBUPROFEN 600 MG PO TABS: 600.0000 mg | ORAL_TABLET | Freq: Three times a day (TID) | ORAL | Status: DC | PRN

## 2013-03-13 NOTE — Progress Notes (Signed)
  Subjective:    Patient ID: Michael Cortez, male    DOB: 1956-07-04, 57 y.o.   MRN: [id number]  HPI This 57 y.o. male presents for evaluation of left shoulder discomfort and left chest discomfort. Patient states he awoke this am with c/o left sholder discomfort radiating into his left chest. He states he has difficulty raising his left are up.  He states he had a cardiac cath 12 years ago and  It showed minimal coronary artery disease and last year he had stress test and echocardiogram by Cardiology and was told he was okay.  He has family hx of CAD.   Review of Systems C/o chest pain and left shoulder discomfort.  Denies SOB, HA, dizziness, vision change, N/V, diarrhea, constipation, dysuria, urinary urgency or frequency or rash.     Objective:   Physical Exam Vital signs noted  Well developed well nourished male.  HEENT - Head atraumatic Normocephalic                Eyes - PERRLA, Conjuctiva - clear Sclera- Clear EOMI                Ears - EAC's Wnl TM's Wnl Gross Hearing WNL                Nose - Nares patent                 Throat - oropharanx wnl Respiratory - Lungs CTA bilateral Cardiac - RRR S1 and S2 without murmur MS - Tenderness left shoulder with external rotation and decreased ROM left shoulder, Decreased abduction left shoulder and TTP left anterior chest.   EKG - NSR with early repolorization in all leads, no acute st-t changes seen.    Assessment & Plan:  Chest pain - Plan: EKG 12-Lead, ibuprofen (ADVIL,MOTRIN) 600 MG tablet po tid x 10 days #30 Discussed and reassured this is not cardiac but musculoskeletal.  Follow up prn.  Disorders of bursae and tendons in shoulder region, unspecified - Plan: ibuprofen (ADVIL,MOTRIN) 600 MG tablet po tid x 10 Days #30, heating pad left shoulder and follow up prn if not better.

## 2013-03-13 NOTE — Patient Instructions (Signed)
Shoulder Pain  The shoulder is the joint that connects your arms to your body. The bones that form the shoulder joint include the upper arm bone (humerus), the shoulder blade (scapula), and the collarbone (clavicle). The top of the humerus is shaped like a ball and fits into a rather flat socket on the scapula (glenoid cavity). A combination of muscles and strong, fibrous tissues that connect muscles to bones (tendons) support your shoulder joint and hold the ball in the socket. Small, fluid-filled sacs (bursae) are located in different areas of the joint. They act as cushions between the bones and the overlying soft tissues and help reduce friction between the gliding tendons and the bone as you move your arm. Your shoulder joint allows a wide range of motion in your arm. This range of motion allows you to do things like scratch your back or throw a ball. However, this range of motion also makes your shoulder more prone to pain from overuse and injury.  Causes of shoulder pain can originate from both injury and overuse and usually can be grouped in the following four categories:   Redness, swelling, and pain (inflammation) of the tendon (tendinitis) or the bursae (bursitis).   Instability, such as a dislocation of the joint.   Inflammation of the joint (arthritis).   Broken bone (fracture).  HOME CARE INSTRUCTIONS    Apply ice to the sore area.   Put ice in a plastic bag.   Place a towel between your skin and the bag.   Leave the ice on for 15-20 minutes, 3-4 times per day for the first 2 days.   Stop using cold packs if they do not help with the pain.   If you have a shoulder sling or immobilizer, wear it as long as your caregiver instructs. Only remove it to shower or bathe. Move your arm as little as possible, but keep your hand moving to prevent swelling.   Squeeze a soft ball or foam pad as much as possible to help prevent swelling.   Only take over-the-counter or prescription medicines for pain,  discomfort, or fever as directed by your caregiver.  SEEK MEDICAL CARE IF:    Your shoulder pain increases, or new pain develops in your arm, hand, or fingers.   Your hand or fingers become cold and numb.   Your pain is not relieved with medicines.  SEEK IMMEDIATE MEDICAL CARE IF:    Your arm, hand, or fingers are numb or tingling.   Your arm, hand, or fingers are significantly swollen or turn white or blue.  MAKE SURE YOU:    Understand these instructions.   Will watch your condition.   Will get help right away if you are not doing well or get worse.  Document Released: 05/09/2005 Document Revised: 04/23/2012 Document Reviewed: 07/14/2011  [employer] Patient Information 2014 [employer].

## 2013-03-14 LAB — ROCKY MTN SPOTTED FVR ABS PNL(IGG+IGM)
RMSF IgG: NEGATIVE
RMSF IgM: 0.25 index (ref 0.00–0.89)

## 2013-03-14 LAB — THYROID PANEL WITH TSH
Free Thyroxine Index: 2.4 (ref 1.2–4.9)
T3 Uptake Ratio: 26 % (ref 24–39)
T4, Total: 9.1 ug/dL (ref 4.5–12.0)
TSH: 1.47 u[IU]/mL (ref 0.450–4.500)

## 2013-03-16 ENCOUNTER — Telehealth: Payer: Self-pay | Admitting: Physician Assistant

## 2013-03-20 NOTE — Telephone Encounter (Signed)
Patient was able to view results on My Chart. Suggested a low fat, low chol diet to improve lipid levels. Explained that the labs hadn't been officially signed off on and that once they are we will call with recommendations.

## 2013-04-23 ENCOUNTER — Ambulatory Visit: Payer: Self-pay | Admitting: Family Medicine

## 2013-05-04 ENCOUNTER — Encounter: Payer: Self-pay | Admitting: *Deleted

## 2013-06-18 ENCOUNTER — Other Ambulatory Visit: Payer: Self-pay

## 2016-01-17 DIAGNOSIS — Z139 Encounter for screening, unspecified: Secondary | ICD-10-CM

## 2016-01-18 DIAGNOSIS — Z139 Encounter for screening, unspecified: Secondary | ICD-10-CM

## 2016-02-17 ENCOUNTER — Encounter (HOSPITAL_COMMUNITY): Payer: Self-pay

## 2016-02-17 ENCOUNTER — Emergency Department (HOSPITAL_COMMUNITY): Payer: Self-pay

## 2016-02-17 ENCOUNTER — Emergency Department (HOSPITAL_COMMUNITY)
Admission: EM | Admit: 2016-02-17 | Discharge: 2016-02-17 | Discharge status: Against Medical Advice | Payer: Self-pay | Attending: Emergency Medicine | Admitting: Emergency Medicine

## 2016-02-17 DIAGNOSIS — R270 Ataxia, unspecified: Secondary | ICD-10-CM

## 2016-02-17 DIAGNOSIS — Z7982 Long term (current) use of aspirin: Secondary | ICD-10-CM | POA: Insufficient documentation

## 2016-02-17 DIAGNOSIS — Z791 Long term (current) use of non-steroidal anti-inflammatories (NSAID): Secondary | ICD-10-CM | POA: Insufficient documentation

## 2016-02-17 DIAGNOSIS — F329 Major depressive disorder, single episode, unspecified: Secondary | ICD-10-CM | POA: Insufficient documentation

## 2016-02-17 DIAGNOSIS — E785 Hyperlipidemia, unspecified: Secondary | ICD-10-CM | POA: Insufficient documentation

## 2016-02-17 DIAGNOSIS — R42 Dizziness and giddiness: Secondary | ICD-10-CM | POA: Insufficient documentation

## 2016-02-17 DIAGNOSIS — F17200 Nicotine dependence, unspecified, uncomplicated: Secondary | ICD-10-CM | POA: Insufficient documentation

## 2016-02-17 LAB — CBC WITH DIFFERENTIAL/PLATELET
Basophils Absolute: 0.1 10*3/uL (ref 0.0–0.1)
Basophils Relative: 1 %
Eosinophils Absolute: 0.2 10*3/uL (ref 0.0–0.7)
Eosinophils Relative: 2 %
HCT: 41.8 % (ref 39.0–52.0)
Hemoglobin: 14.8 g/dL (ref 13.0–17.0)
Lymphocytes Relative: 16 %
Lymphs Abs: 1.6 10*3/uL (ref 0.7–4.0)
MCH: 33.6 pg (ref 26.0–34.0)
MCHC: 35.4 g/dL (ref 30.0–36.0)
MCV: 94.8 fL (ref 78.0–100.0)
Monocytes Absolute: 0.6 10*3/uL (ref 0.1–1.0)
Monocytes Relative: 6 %
Neutro Abs: 7.6 10*3/uL (ref 1.7–7.7)
Neutrophils Relative %: 75 %
Platelets: 198 10*3/uL (ref 150–400)
RBC: 4.41 MIL/uL (ref 4.22–5.81)
RDW: 12.3 % (ref 11.5–15.5)
WBC: 10.1 10*3/uL (ref 4.0–10.5)

## 2016-02-17 LAB — URINALYSIS, ROUTINE W REFLEX MICROSCOPIC
Bilirubin Urine: NEGATIVE
Glucose, UA: NEGATIVE mg/dL
Ketones, ur: NEGATIVE mg/dL
Leukocytes, UA: NEGATIVE
Nitrite: NEGATIVE
Protein, ur: NEGATIVE mg/dL
Specific Gravity, Urine: 1.015 (ref 1.005–1.030)
pH: 6.5 (ref 5.0–8.0)

## 2016-02-17 LAB — BASIC METABOLIC PANEL
Anion gap: 12 (ref 5–15)
BUN: 17 mg/dL (ref 6–20)
CO2: 20 mmol/L — ABNORMAL LOW (ref 22–32)
Calcium: 9 mg/dL (ref 8.9–10.3)
Chloride: 104 mmol/L (ref 101–111)
Creatinine, Ser: 1.19 mg/dL (ref 0.61–1.24)
GFR calc Af Amer: >60 mL/min (ref >60)
GFR calc non Af Amer: >60 mL/min (ref >60)
Glucose, Bld: 117 mg/dL — ABNORMAL HIGH (ref 65–99)
Potassium: 3.7 mmol/L (ref 3.5–5.1)
Sodium: 136 mmol/L (ref 135–145)

## 2016-02-17 LAB — HEPATIC FUNCTION PANEL
ALT: 16 U/L — ABNORMAL LOW (ref 17–63)
AST: 18 U/L (ref 15–41)
Albumin: 4.3 g/dL (ref 3.5–5.0)
Alkaline Phosphatase: 71 U/L (ref 38–126)
Bilirubin, Direct: 0.1 mg/dL (ref 0.1–0.5)
Indirect Bilirubin: 0.4 mg/dL (ref 0.3–0.9)
Total Bilirubin: 0.5 mg/dL (ref 0.3–1.2)
Total Protein: 6.7 g/dL (ref 6.5–8.1)

## 2016-02-17 LAB — URINE MICROSCOPIC-ADD ON

## 2016-02-17 LAB — TROPONIN I: Troponin I: <0.03 ng/mL (ref <0.03)

## 2016-02-17 MED ORDER — MECLIZINE HCL 12.5 MG PO TABS
25.0000 mg | ORAL_TABLET | Freq: Once | ORAL | Status: AC
  Administered 2016-02-17: 25 mg via ORAL
  Filled 2016-02-17: qty 2

## 2016-02-17 MED ORDER — MECLIZINE HCL 25 MG PO TABS: 25.0000 mg | ORAL_TABLET | Freq: Two times a day (BID) | ORAL | Status: DC | PRN

## 2016-02-17 MED ORDER — SODIUM CHLORIDE 0.9 % IV BOLUS (SEPSIS)
1000.0000 mL | Freq: Once | INTRAVENOUS | Status: AC
  Administered 2016-02-17: 1000 mL via INTRAVENOUS

## 2016-02-17 MED ORDER — ASPIRIN 81 MG PO CHEW: 81.0000 mg | CHEWABLE_TABLET | Freq: Every day | ORAL | Status: DC

## 2016-02-17 MED ORDER — ONDANSETRON HCL 4 MG/2ML IJ SOLN: 4.0000 mg | Freq: Once | INTRAMUSCULAR | Status: DC

## 2016-02-17 MED ORDER — ONDANSETRON 4 MG PO TBDP
4.0000 mg | ORAL_TABLET | Freq: Once | ORAL | Status: AC
  Administered 2016-02-17: 4 mg via ORAL
  Filled 2016-02-17: qty 1

## 2016-02-17 MED ORDER — MECLIZINE HCL 12.5 MG PO TABS
12.5000 mg | ORAL_TABLET | Freq: Once | ORAL | Status: AC
  Administered 2016-02-17: 12.5 mg via ORAL
  Filled 2016-02-17: qty 1

## 2016-02-17 NOTE — ED Notes (Signed)
Ambulated pt a short distance from his room, he stumbled a little. Reported that he felt dizzy and a little nauseous.

## 2016-02-17 NOTE — ED Notes (Signed)
Pt complain of tingling to left hand, ringing to right ear and generalized weakness. States he has been taking care of his wife that has cancer and he thinks he may be a little anxious

## 2016-02-17 NOTE — ED Provider Notes (Signed)
CSN: [id number]     Arrival date & time 02/17/16  1450 History   First MD Initiated Contact with Patient 02/17/16 1503     Chief Complaint  Patient presents with  . Fatigue     (Consider location/radiation/quality/duration/timing/severity/associated sxs/prior Treatment) HPI  This is a 60 year old male who presents with multiple complaints to include intermittent headaches for 3 days, vertigo, left hand tingling and numbness. These have been intermittent in nature however today has been more consistent and the headache was a little bit worse and more importantly his vertigo was quite a bit worse and associated with severe nausea. No weakness in his upper extremities or his lower sinus. No trauma. No history of stroke or these types of symptoms. He does say that his right ear feels more congested than normal.  Past Medical History  Diagnosis Date  . Anxiety and depression   . Chest pain 2005    03/2012: neg nuclear stress.  2005- Cardiac cath:20-40% lesions in the LAD, circumflex and RCA with normal EF  . Hyperlipidemia   . Tobacco abuse     40 pack year total consumption  . Degenerative joint disease    Past Surgical History  Procedure Laterality Date  . Appendectomy     Family History  Problem Relation Age of Onset  . Coronary artery disease Father 1564    Fatal myocardial infarction   Social History  Substance Use Topics  . Smoking status: Current Every Day Smoker -- 1.00 packs/day for 40 years  . Smokeless tobacco: None  . Alcohol Use: No    Review of Systems  Constitutional: Negative for fever and chills.  Eyes: Negative for pain.  Respiratory: Negative for cough and shortness of breath.   All other systems reviewed and are negative.     Allergies  Review of patient's allergies indicates no known allergies.  Home Medications   Prior to Admission medications   Medication Sig Start Date End Date Taking? Authorizing Provider  ibuprofen (ADVIL,MOTRIN) 600 MG tablet  Take 1 tablet (600 mg total) by mouth every 8 (eight) hours as needed for pain. 03/13/13  Yes Deatra CanterWilliam J Oxford, FNP  aspirin 81 MG chewable tablet Chew 1 tablet (81 mg total) by mouth daily. 02/17/16   Marily MemosJason Shade Kaley, MD  meclizine (ANTIVERT) 25 MG tablet Take 1 tablet (25 mg total) by mouth 2 (two) times daily as needed for dizziness or nausea. 02/17/16   Barbara CowerJason Cloe Sockwell, MD   BP 158/95 mmHg  Pulse 87  Temp(Src) 97.7 F (36.5 C)  Resp 16  SpO2 100% Physical Exam  Constitutional: He is oriented to person, place, and time. He appears well-developed and well-nourished.  HENT:  Head: Normocephalic and atraumatic.  Right Ear: Tympanic membrane normal.  Left Ear: Tympanic membrane normal.  Neck: Normal range of motion.  Cardiovascular: Normal rate.  Exam reveals no friction rub.   No murmur heard. Pulmonary/Chest: Effort normal. No respiratory distress. He has no wheezes. He has no rales.  Abdominal: He exhibits no distension. There is no tenderness.  Musculoskeletal: Normal range of motion. He exhibits no edema or tenderness.  Neurological: He is alert and oriented to person, place, and time.  No altered mental status, able to give full seemingly accurate history.  Face is symmetric, EOM's intact but NYSTAGMUS PRESENT, pupils equal and reactive, vision intact, tongue and uvula midline without deviation Upper and Lower extremity motor 5/5, intact pain perception in distal extremities, 2+ reflexes in biceps, patella and achilles tendons. Finger to  nose normal, heel to shin normal. Walks with SOME ATAXIA.   Nursing note and vitals reviewed.   ED Course  Procedures (including critical care time) Labs Review Labs Reviewed  BASIC METABOLIC PANEL - Abnormal; Notable for the following:    CO2 20 (*)    Glucose, Bld 117 (*)    All other components within normal limits  URINALYSIS, ROUTINE W REFLEX MICROSCOPIC (NOT AT [facility]) - Abnormal; Notable for the following:    Hgb urine dipstick SMALL (*)    All  other components within normal limits  HEPATIC FUNCTION PANEL - Abnormal; Notable for the following:    ALT 16 (*)    All other components within normal limits  URINE MICROSCOPIC-ADD ON - Abnormal; Notable for the following:    Squamous Epithelial / LPF 0-5 (*)    Bacteria, UA RARE (*)    All other components within normal limits  CBC WITH DIFFERENTIAL/PLATELET  TROPONIN I    Imaging Review Dg Chest 2 View  02/17/2016  CLINICAL DATA:  Generalized weakness for 4 days EXAM: CHEST  2 VIEW COMPARISON:  March 26, 2012 chest radiograph and chest CT FINDINGS: There is no edema or consolidation. The heart size and pulmonary vascularity are normal. No adenopathy. There is degenerative change in the thoracic spine. IMPRESSION: No edema or consolidation. Electronically Signed   By: Bretta Bang III M.D.   On: 02/17/2016 16:28   Ct Head Wo Contrast  02/17/2016  CLINICAL DATA:  Left hand tingling and weakness, initial encounter EXAM: CT HEAD WITHOUT CONTRAST TECHNIQUE: Contiguous axial images were obtained from the base of the skull through the vertex without intravenous contrast. COMPARISON:  None FINDINGS: Bony calvarium is intact. No gross soft tissue abnormality is noted. Few scattered areas of decreased attenuation are noted. One within the left frontal parietal region an a second within the right cerebellar hemisphere laterally. These are consistent with areas of prior ischemia. No findings to suggest acute hemorrhage, acute infarction or space-occupying mass lesion are noted. IMPRESSION: Chronic ischemic changes without acute abnormality. Electronically Signed   By: Alcide Clever M.D.   On: 02/17/2016 16:54   I have personally reviewed and evaluated these images and lab results as part of my medical decision-making.   EKG Interpretation   Date/Time:  Friday February 17 2016 15:19:05 EDT Ventricular Rate:  69 PR Interval:    QRS Duration: 105 QT Interval:  405 QTC Calculation: 434 R Axis:    70 Text Interpretation:  Sinus rhythm.  No STEMI. No change compared to  prior.  Confirmed by LONG MD, JOSHUA [id number]) on 02/17/2016 3:22:15 PM      MDM   Final diagnoses:  Vertigo  Ataxia    Concern for peripheral versus central vertigo so CT scan done which was unremarkable. Tendon ambulance patient and patient had relatively significant ataxia but was able to continue walking. Symptoms improved with meclizine and fluids. I want to get MRI ordered but the patient for TIA type symptoms to evaluate for stroke R the patient did not want this. He stated he could not afford it and he needed take care of his wife at home. I discussed that he cannot take care of his wife. He was having a stroke and the symptoms got worse and that no amount of money was worth him dying however the patient was adamant that he did not want to stay in the hospital for any further workup of this. I discussed neck best option which was  to follow up with outpatient with neurology and I gave her a prescription for meclizine and he will do that.   New Prescriptions: Discharge Medication List as of 02/17/2016  8:34 PM    START taking these medications   Details  aspirin 81 MG chewable tablet Chew 1 tablet (81 mg total) by mouth daily., Starting 02/17/2016, Until Discontinued, Print    meclizine (ANTIVERT) 25 MG tablet Take 1 tablet (25 mg total) by mouth 2 (two) times daily as needed for dizziness or nausea., Starting 02/17/2016, Until Discontinued, Print        I have personally and contemperaneously reviewed labs and imaging and used in my decision making as above.   I determined that the patient had the capacity to make their decision and understood the consequences of that decision.We discussed the nature and purpose, risks and benefits, as well as, the alternatives of treatment. Time was given to allow the opportunity to ask questions and consider their options, and after the discussion, the patient decided to refuse the  offerred treatment. The patient was informed that refusal could lead to, but was not limited to, death, permanent disability, or severe pain. If present, I asked the relatives or significant others to dissuade them without success. Even after refusal, I made every reasonable opportunity to treat them to the best of my ability.  Marily MemosJason Niasia Lanphear, MD 02/17/16 [id number]

## 2016-02-17 NOTE — ED Notes (Signed)
Pt verbalized that he would like to leave, MD Mesner notified.

## 2016-02-17 NOTE — ED Notes (Signed)
Pt verbalizes understanding of risks of leaving AMA. Stated he would return if sx worsen or he would like to be admitted.

## 2016-02-17 NOTE — ED Notes (Signed)
Pt with equal grip strength. No drift in upper or lower ext. Able to follow instructions and answer appropriately. Report HA on Monday and lightheaded. Intermittent HA since. Vomited upon arrival . Sates tips of fingers in left hand tingly. ALso reports right side of head has been "stopped up" and rt ear ringing

## 2016-02-18 ENCOUNTER — Observation Stay (HOSPITAL_COMMUNITY)
Admission: EM | Admit: 2016-02-18 | Discharge: 2016-02-19 | Disposition: A | Discharge status: Home / Self Care | Payer: Self-pay | Attending: Family Medicine | Admitting: Family Medicine

## 2016-02-18 ENCOUNTER — Observation Stay (HOSPITAL_COMMUNITY): Payer: Self-pay

## 2016-02-18 ENCOUNTER — Encounter (HOSPITAL_COMMUNITY): Payer: Self-pay | Admitting: *Deleted

## 2016-02-18 DIAGNOSIS — Z720 Tobacco use: Secondary | ICD-10-CM | POA: Diagnosis present

## 2016-02-18 DIAGNOSIS — E669 Obesity, unspecified: Secondary | ICD-10-CM | POA: Insufficient documentation

## 2016-02-18 DIAGNOSIS — R531 Weakness: Secondary | ICD-10-CM

## 2016-02-18 DIAGNOSIS — F17210 Nicotine dependence, cigarettes, uncomplicated: Secondary | ICD-10-CM | POA: Insufficient documentation

## 2016-02-18 DIAGNOSIS — Z23 Encounter for immunization: Secondary | ICD-10-CM | POA: Insufficient documentation

## 2016-02-18 DIAGNOSIS — E785 Hyperlipidemia, unspecified: Secondary | ICD-10-CM | POA: Diagnosis present

## 2016-02-18 DIAGNOSIS — Z6832 Body mass index (BMI) 32.0-32.9, adult: Secondary | ICD-10-CM | POA: Insufficient documentation

## 2016-02-18 DIAGNOSIS — W01198A Fall on same level from slipping, tripping and stumbling with subsequent striking against other object, initial encounter: Secondary | ICD-10-CM | POA: Insufficient documentation

## 2016-02-18 DIAGNOSIS — I63541 Cerebral infarction due to unspecified occlusion or stenosis of right cerebellar artery: Secondary | ICD-10-CM

## 2016-02-18 DIAGNOSIS — Y92003 Bedroom of unspecified non-institutional (private) residence as the place of occurrence of the external cause: Secondary | ICD-10-CM | POA: Insufficient documentation

## 2016-02-18 DIAGNOSIS — R262 Difficulty in walking, not elsewhere classified: Secondary | ICD-10-CM | POA: Insufficient documentation

## 2016-02-18 DIAGNOSIS — R4781 Slurred speech: Secondary | ICD-10-CM | POA: Insufficient documentation

## 2016-02-18 DIAGNOSIS — I639 Cerebral infarction, unspecified: Secondary | ICD-10-CM | POA: Diagnosis present

## 2016-02-18 DIAGNOSIS — I10 Essential (primary) hypertension: Secondary | ICD-10-CM | POA: Insufficient documentation

## 2016-02-18 DIAGNOSIS — I6509 Occlusion and stenosis of unspecified vertebral artery: Secondary | ICD-10-CM | POA: Diagnosis present

## 2016-02-18 DIAGNOSIS — R29898 Other symptoms and signs involving the musculoskeletal system: Secondary | ICD-10-CM | POA: Diagnosis present

## 2016-02-18 DIAGNOSIS — Z7982 Long term (current) use of aspirin: Secondary | ICD-10-CM | POA: Insufficient documentation

## 2016-02-18 DIAGNOSIS — I63211 Cerebral infarction due to unspecified occlusion or stenosis of right vertebral arteries: Principal | ICD-10-CM | POA: Insufficient documentation

## 2016-02-18 DIAGNOSIS — R42 Dizziness and giddiness: Secondary | ICD-10-CM

## 2016-02-18 DIAGNOSIS — R260 Ataxic gait: Secondary | ICD-10-CM | POA: Insufficient documentation

## 2016-02-18 DIAGNOSIS — G8321 Monoplegia of upper limb affecting right dominant side: Secondary | ICD-10-CM | POA: Insufficient documentation

## 2016-02-18 LAB — BASIC METABOLIC PANEL
Anion gap: 6 (ref 5–15)
BUN: 12 mg/dL (ref 6–20)
CO2: 24 mmol/L (ref 22–32)
Calcium: 8.8 mg/dL — ABNORMAL LOW (ref 8.9–10.3)
Chloride: 107 mmol/L (ref 101–111)
Creatinine, Ser: 1.23 mg/dL (ref 0.61–1.24)
GFR calc Af Amer: >60 mL/min (ref >60)
GFR calc non Af Amer: >60 mL/min (ref >60)
Glucose, Bld: 94 mg/dL (ref 65–99)
Potassium: 3.6 mmol/L (ref 3.5–5.1)
Sodium: 137 mmol/L (ref 135–145)

## 2016-02-18 LAB — CBC WITH DIFFERENTIAL/PLATELET
Basophils Absolute: 0 10*3/uL (ref 0.0–0.1)
Basophils Relative: 0 %
Eosinophils Absolute: 0.1 10*3/uL (ref 0.0–0.7)
Eosinophils Relative: 1 %
HCT: 41 % (ref 39.0–52.0)
Hemoglobin: 14.6 g/dL (ref 13.0–17.0)
Lymphocytes Relative: 25 %
Lymphs Abs: 2 10*3/uL (ref 0.7–4.0)
MCH: 34 pg (ref 26.0–34.0)
MCHC: 35.6 g/dL (ref 30.0–36.0)
MCV: 95.3 fL (ref 78.0–100.0)
Monocytes Absolute: 0.6 10*3/uL (ref 0.1–1.0)
Monocytes Relative: 7 %
Neutro Abs: 5.3 10*3/uL (ref 1.7–7.7)
Neutrophils Relative %: 67 %
Platelets: 222 10*3/uL (ref 150–400)
RBC: 4.3 MIL/uL (ref 4.22–5.81)
RDW: 12.7 % (ref 11.5–15.5)
WBC: 8 10*3/uL (ref 4.0–10.5)

## 2016-02-18 LAB — CBC
HCT: 40.7 % (ref 39.0–52.0)
Hemoglobin: 14.1 g/dL (ref 13.0–17.0)
MCH: 33.4 pg (ref 26.0–34.0)
MCHC: 34.6 g/dL (ref 30.0–36.0)
MCV: 96.4 fL (ref 78.0–100.0)
Platelets: 210 10*3/uL (ref 150–400)
RBC: 4.22 MIL/uL (ref 4.22–5.81)
RDW: 12.6 % (ref 11.5–15.5)
WBC: 6.6 10*3/uL (ref 4.0–10.5)

## 2016-02-18 LAB — CREATININE, SERUM
Creatinine, Ser: 1.25 mg/dL — ABNORMAL HIGH (ref 0.61–1.24)
GFR calc Af Amer: >60 mL/min (ref >60)
GFR calc non Af Amer: >60 mL/min (ref >60)

## 2016-02-18 MED ORDER — HEPARIN SODIUM (PORCINE) 5000 UNIT/ML IJ SOLN
5000.0000 [IU] | Freq: Three times a day (TID) | INTRAMUSCULAR | Status: DC
  Administered 2016-02-18 – 2016-02-19 (×3): 5000 [IU] via SUBCUTANEOUS
  Filled 2016-02-18 (×3): qty 1

## 2016-02-18 MED ORDER — SODIUM CHLORIDE 0.9 % IV SOLN
INTRAVENOUS | Status: DC
  Administered 2016-02-18: 17:00:00 via INTRAVENOUS

## 2016-02-18 MED ORDER — SODIUM CHLORIDE 0.9 % IV BOLUS (SEPSIS)
500.0000 mL | Freq: Once | INTRAVENOUS | Status: AC
  Administered 2016-02-18: 500 mL via INTRAVENOUS

## 2016-02-18 MED ORDER — ASPIRIN 81 MG PO CHEW: 81.0000 mg | CHEWABLE_TABLET | Freq: Every day | ORAL | Status: DC

## 2016-02-18 MED ORDER — PNEUMOCOCCAL VAC POLYVALENT 25 MCG/0.5ML IJ INJ
0.5000 mL | INJECTION | INTRAMUSCULAR | Status: AC
  Administered 2016-02-19: 0.5 mL via INTRAMUSCULAR
  Filled 2016-02-18: qty 0.5

## 2016-02-18 MED ORDER — ATORVASTATIN CALCIUM 40 MG PO TABS
40.0000 mg | ORAL_TABLET | Freq: Every day | ORAL | Status: DC
  Administered 2016-02-18: 40 mg via ORAL
  Filled 2016-02-18: qty 1

## 2016-02-18 MED ORDER — STROKE: EARLY STAGES OF RECOVERY BOOK
Freq: Once | Status: AC
  Administered 2016-02-18: 20:00:00

## 2016-02-18 MED ORDER — SENNOSIDES-DOCUSATE SODIUM 8.6-50 MG PO TABS: 1.0000 | ORAL_TABLET | Freq: Every evening | ORAL | Status: DC | PRN

## 2016-02-18 MED ORDER — ASPIRIN 300 MG RE SUPP: 300.0000 mg | Freq: Every day | RECTAL | Status: DC

## 2016-02-18 MED ORDER — GADOBENATE DIMEGLUMINE 529 MG/ML IV SOLN
15.0000 mL | Freq: Once | INTRAVENOUS | Status: AC | PRN
  Administered 2016-02-18: 15 mL via INTRAVENOUS

## 2016-02-18 MED ORDER — ASPIRIN 325 MG PO TABS
325.0000 mg | ORAL_TABLET | Freq: Every day | ORAL | Status: DC
  Administered 2016-02-18 – 2016-02-19 (×2): 325 mg via ORAL
  Filled 2016-02-18 (×2): qty 1

## 2016-02-18 NOTE — ED Notes (Signed)
Pt started having dizziness on Monday was seen here yesterday for that and discharged home. Pt has had a CT. States he work up this morning for right arm heaviness, pt is able to move this arm freely.

## 2016-02-18 NOTE — ED Notes (Signed)
LKW 02/17/16 at 1030 pm

## 2016-02-18 NOTE — Consult Note (Signed)
Referring Physician: Dr Benjamine Mola    Chief Complaint:   HPI: Michael Cortez is an 60 y.o. male  with a history of hyperlipidemia and ongoing tobacco use who has been experiencing waxing and waning symptoms of non-positional vertigo, headache, slurred speech, and right upper extremity weakness for approximately one week. The patient was seen at the [facility] emergency department yesterday where a CT scan of the head was reportedly negative. The patient declined admission as he cares for his wife at home who has breast cancer and they have no medical insurance. He was started on aspirin 81 mg daily at that time. The patient initially felt better this morning but as the day progressed he once again had balance problems and difficulty walking. He went back to the emergency department, had a repeat CT scan of the head, and was transferred to [facility] for further evaluation and treatment. The patient feels he is almost back to baseline although he has not attempted ambulation recently.  Date last known well: 02/11/2016 Time last known well: Unable to determine tPA Given: No: Late presentation  Past Medical History  Diagnosis Date  . Anxiety and depression   . Chest pain 2005    03/2012: neg nuclear stress.  2005- Cardiac cath:20-40% lesions in the LAD, circumflex and RCA with normal EF  . Hyperlipidemia   . Tobacco abuse     40 pack year total consumption  . Degenerative joint disease     Past Surgical History  Procedure Laterality Date  . Appendectomy      Family History  Problem Relation Age of Onset  . Coronary artery disease Father 34    Fatal myocardial infarction   The patient reports that his father had a stroke  Social History:  reports that he has been smoking.  He does not have any smokeless tobacco history on file. He reports that he does not drink alcohol or use illicit drugs.  Allergies: No Known Allergies  Medications:  Scheduled: .  stroke: mapping our  early stages of recovery book   Does not apply Once  . aspirin  300 mg Rectal Daily   Or  . aspirin  325 mg Oral Daily  . atorvastatin  40 mg Oral q1800  . heparin  5,000 Units Subcutaneous Q8H  . [START ON 02/19/2016] pneumococcal 23 valent vaccine  0.5 mL Intramuscular Tomorrow-1000    ROS: History obtained from the patient  General ROS: negative for - chills, fatigue, fever, night sweats, weight gain or weight loss. Recent headache. Psychological ROS: negative for - behavioral disorder, hallucinations, memory difficulties, mood swings or suicidal ideation Ophthalmic ROS: negative for - blurry vision, double vision, eye pain or loss of vision ENT ROS: negative for - epistaxis, nasal discharge, oral lesions, sore throat, tinnitus or vertigo Allergy and Immunology ROS: negative for - hives or itchy/watery eyes Hematological and Lymphatic ROS: negative for - bleeding problems, bruising or swollen lymph nodes Endocrine ROS: negative for - galactorrhea, hair pattern changes, polydipsia/polyuria or temperature intolerance Respiratory ROS: negative for -  hemoptysis, shortness of breath or wheezing. Positive for a productive cough. Cardiovascular ROS: negative for - chest pain, dyspnea on exertion, edema or irregular heartbeat Gastrointestinal ROS: negative for - abdominal pain, diarrhea, hematemesis, or stool incontinence. Recent N, V and diarrhea. Genito-Urinary ROS: negative for - dysuria, hematuria, incontinence or urinary frequency/urgency Musculoskeletal ROS: negative for - joint swelling or muscular weakness. Positive for arthritis in hands and fingers. Neurological ROS: as noted  in HPI Dermatological ROS: negative for rash and skin lesion changes   Physical Examination: Blood pressure 150/92, pulse 74, temperature 98.6 F (37 C), temperature source Oral, resp. rate 18, weight 87.998 kg (194 lb), SpO2 96 %.  General - pleasant alert 60 year old male in no acute distress Heart -  Regular rate and rhythm - no murmer appreciated Lungs - Clear to auscultation but slightly decreased breath sounds. Abdomen - Soft - non tender Extremities - Distal pulses intact - no edema Skin - Warm and dry  Mental Status: Alert, oriented, thought content appropriate.  Speech fluent without evidence of aphasia.  Able to follow 3 step commands without difficulty. Cranial Nerves: II: Discs not visualized; Visual fields grossly normal, pupils equal, round, reactive to light and accommodation III,IV, VI: ptosis not present, extra-ocular motions intact bilaterally. No nystagmus. V,VII: smile symmetric, facial light touch sensation normal bilaterally VIII: hearing normal bilaterally IX,X: gag reflex present XI: bilateral shoulder shrug XII: midline tongue extension Motor: Right : Upper extremity   5/5    Left:     Upper extremity   5/5  Lower extremity   5/5     Lower extremity   5/5 Tone and bulk:normal tone throughout; no atrophy noted Sensory: Light touch intact throughout, bilaterally. Temperature discrimination intact. Deep Tendon Reflexes: 2+ and symmetric throughout Plantars: Right: downgoing   Left: downgoing Cerebellar: normal finger-to-nose, normal rapid alternating movements and normal heel-to-shin test Gait: Deferred     Laboratory Studies:  Basic Metabolic Panel:  Recent Labs Lab 02/17/16 1507 02/18/16 1118  NA 136 137  K 3.7 3.6  CL 104 107  CO2 20* 24  GLUCOSE 117* 94  BUN 17 12  CREATININE 1.19 1.23  CALCIUM 9.0 8.8*    Liver Function Tests:  Recent Labs Lab 02/17/16 1507  AST 18  ALT 16*  ALKPHOS 71  BILITOT 0.5  PROT 6.7  ALBUMIN 4.3   No results for input(s): LIPASE, AMYLASE in the last 168 hours. No results for input(s): AMMONIA in the last 168 hours.  CBC:  Recent Labs Lab 02/17/16 1507 02/18/16 1118  WBC 10.1 8.0  NEUTROABS 7.6 5.3  HGB 14.8 14.6  HCT 41.8 41.0  MCV 94.8 95.3  PLT 198 222    Cardiac Enzymes:  Recent  Labs Lab 02/17/16 1507  TROPONINI <0.03    BNP: Invalid input(s): POCBNP  CBG: No results for input(s): GLUCAP in the last 168 hours.  Microbiology: No results found for this or any previous visit.  Coagulation Studies: No results for input(s): LABPROT, INR in the last 72 hours.  Urinalysis:  Recent Labs Lab 02/17/16 1720  COLORURINE YELLOW  LABSPEC 1.015  PHURINE 6.5  GLUCOSEU NEGATIVE  HGBUR SMALL*  BILIRUBINUR NEGATIVE  KETONESUR NEGATIVE  PROTEINUR NEGATIVE  NITRITE NEGATIVE  LEUKOCYTESUR NEGATIVE    Lipid Panel:    Component Value Date/Time   CHOL 207* 03/12/2013 1615   CHOL 161 05/01/2012 1410   TRIG 87 03/12/2013 1615   TRIG 66 05/01/2012 1410   HDL 47 03/12/2013 1615   HDL 55 05/01/2012 1410   CHOLHDL 2.9 05/01/2012 1410   VLDL 13 05/01/2012 1410   LDLCALC 143* 03/12/2013 1615   LDLCALC 93 05/01/2012 1410    HgbA1C:  Lab Results  Component Value Date   HGBA1C 5.1% 03/12/2013    Urine Drug Screen:  No results found for: LABOPIA, COCAINSCRNUR, LABBENZ, AMPHETMU, THCU, LABBARB  Alcohol Level: No results for input(s): ETH in the last 168 hours.  Other results: EKG: Sinus rhythm rate 76 bpm. Please refer to the formal cardiology reading for complete details.  Imaging:   Dg Chest 2 View 02/17/2016   No edema or consolidation.    Ct Head Wo Contrast 02/17/2016   Chronic ischemic changes without acute abnormality.    MRI / MRA BRAIN 02/18/2016 1. Positive for emergent large vessel occlusion: Right vertebral artery, with associated acute right PICA infarcts (right  lateral medulla and right cerebellum). No associated hemorrhage or mass effect. 2. Small associated acute infarcts in the left occipital lobe.   Assessment: 60 y.o. male with a history of hyperlipidemia and ongoing tobacco use but no regular medical follow-up who developed transient non-positional vertigo, balance problems, gait difficulties, slurred speech, headache, and right  upper extremity weakness which have for the most part resolved. An MRI / MRA revealed an emergent large vessel occlusion of the right vertebral artery, with associated acute right PICA infarcts (right lateral medulla and right cerebellum) and small associated acute infarcts in the left occipital lobe. The patient will be admitted for full stroke workup and risk factor modification.  Stroke Risk Factors - family history, hyperlipidemia and smoking  Plan: 1. HgbA1c, fasting lipid panel 2. MRI, MRA  of the brain without contrast 3. PT consult, OT consult, Speech consult 4. Echocardiogram 5. Carotid dopplers 6. Prophylactic therapy- enteric-coated aspirin 325 mg daily 7. NPO until RN stroke swallow screen 8. Telemetry monitoring 9. Frequent neuro checks 10. UDS   Further impression and plan to follow per Dr. Denny Peon Rinehuls PA-C [facility] Pager [phone] 02/18/2016, 5:13 PM  Neurology attending addendum:  Patient seen, examined, discussed with PA. I reviewed his note and agree with his findings, assessment, and plan with the following additions.  The patient reports that he initially developed a severe headache on 6/30 0/17. He describes a bifrontal headache without any associated symptoms. There is no neck pain associated with this headache. This was subsequently followed by significant vertigo with severe nausea that lasted approximately a day and a half before subsiding gradually. He had some tingling in his left arm and left leg but this has gone away. In addition, he has noticed some clumsiness with his right hand and difficulty walking in a straight line with a tendency to veer to his right. Overall, he thinks all of the symptoms have improved. However, his clumsiness and gait abnormalities persist and this is what brought him to the emergency department today. He presented to an outside emergency department on 02/16/16 where CT scan of the head was obtained and  was reported as normal with no evidence of acute pathology.  On my examination, he is resting in bed and appears comfortable. He is alert and fully oriented. Speech is clear without dysarthria. He has no aphasia. He has some right ptosis. He tells me that some of this is chronic, though on examination he does appear to have some ptosis involving both the upper and the lower lid on that side. His pupils appear to be symmetric and light, with slight asymmetry in the dark with the left pupil dilating more than the right. Both are reactive. The remainder of his cranial nerves are unremarkable. Bulk, tone, and strength are normal. He reports slightly decreased pinprick in the right upper extremity. He has some mild ataxia with right finger to nose and right heel-to-shin. Finger taps are symmetric. Rapid alternating movements are mildly slower on the right but this is insignificant.  I  have personally reviewed the CT scan of the head without contrast from 02/17/16. This shows a hypodensity in the right cerebellar hemisphere that is consistent with a subacute ischemic infarction.  I personally reviewed the MRI scan of the brain without contrast from today. This shows restricted diffusion in the right cerebral hemisphere, the right dorsal lateral medulla, and the left occipital lobe. These are consistent with acute ischemic infarctions, likely embolic in nature. These are associated with corresponding hyperintensities on T2/FLAIR sequences. In addition, he has a few punctate areas of white matter hyperintensities suggestive of chronic small vessel ischemic change. The right vertebral artery flow-void is absent.  Impression: 1. Acute Ischemic Stroke: This is an acute stroke involving the R PICA territory. It is most likely embolic in etiology.  Known risk factors for cerebrovascular disease in this patient include hyperlipidemia and history of tobacco abuse. We will check TTE, fasting lipids, and hemoglobin a1c. He  has had MRA of the head which demonstrates occlusion of the right vertebral artery. However, the remainder of that vessel needs to be assessed and this cannot be adequately done with carotid Dopplers. MRA of the neck will be ordered. Further testing will be determined by results from these initial studies. Recommend antiplatelet therapy with aspirin for secondary stroke prevention once cleared to take oral medications. Also recommend initiation of atorvastatin for secondary stroke prevention. Continue statin with goal LDL less than 70. Ensure adequate glucose control. Since he is likely a week into his stroke, permissive hypertension is not necessary and his blood pressure can be gradually lowered to goal as needed. Avoid fever and hyperglycemia as these can extend the infarct. Avoid hypotonic IVF to minimize exacerbation of post-stroke edema. Initiate rehab services. DVT prophylaxis as needed.   2. Right sided dysmetria: This is due to his stroke. This is acute. PT, OT as needed.  3. Abnormality of gait: He has an ataxic gait with a tendency to veer to the right, consistent with his cerebellar infarction. PT.  4. Possible right Horner's: He reports that he has some right-sided eyelid droop at baseline, though his exam suggests some ptosis involving both lives in the right eye as well as some pupillary asymmetry that is more pronounced in the dark with the right pupil not dilating as well as the left. These would suggest possible Horner's which can be seen with a dorsal lateral medullary stroke. No specific intervention.  This was discussed with the patient and he is in agreement with the plan as stated. He is given the opportunity to ask questions and these are addressed to his satisfaction. Team will assume care of the patient beginning 02/19/16.

## 2016-02-18 NOTE — ED Provider Notes (Signed)
History  By signing my name below, I, Earmon Phoenix, attest that this documentation has been prepared under the direction and in the presence of Donnetta Hutching, MD. Electronically Signed: Earmon Phoenix, ED Scribe. 02/18/2016. 11:18 AM.  Chief Complaint  Patient presents with  . Extremity Weakness   The history is provided by the patient and medical records. No language interpreter was used.    HPI Comments:  Michael Cortez is a 60 y.o. male who presents to the Emergency Department complaining of improving right upper extremity weakness that began this morning about 3 hours ago. He states he lost his balance after getting out of bed causing him to fall against the wall a couple of times about 5 hours ago upon waking. Pt states he was here yesterday for a HA, nausea and vertigo and received a negative CT scan. He states it was suggested that he be admitted yesterday but pt states he could not stay because he had to go home to take care of his wife. He states he took Ibuprofen PTA for the HA with significant relief. He denies alleviating factors. He denies numbness, LOC.   Past Medical History  Diagnosis Date  . Anxiety and depression   . Chest pain 2005    03/2012: neg nuclear stress.  2005- Cardiac cath:20-40% lesions in the LAD, circumflex and RCA with normal EF  . Hyperlipidemia   . Tobacco abuse     40 pack year total consumption  . Degenerative joint disease    Past Surgical History  Procedure Laterality Date  . Appendectomy     Family History  Problem Relation Age of Onset  . Coronary artery disease Father 2    Fatal myocardial infarction   Social History  Substance Use Topics  . Smoking status: Current Every Day Smoker -- 1.00 packs/day for 40 years  . Smokeless tobacco: None  . Alcohol Use: No    Review of Systems A complete 10 system review of systems was obtained and all systems are negative except as noted in the HPI and PMH.   Allergies  Review of patient's  allergies indicates no known allergies.  Home Medications   Prior to Admission medications   Medication Sig Start Date End Date Taking? Authorizing Provider  aspirin 81 MG chewable tablet Chew 1 tablet (81 mg total) by mouth daily. 02/17/16   Marily Memos, MD  ibuprofen (ADVIL,MOTRIN) 600 MG tablet Take 1 tablet (600 mg total) by mouth every 8 (eight) hours as needed for pain. 03/13/13   Deatra Canter, FNP  meclizine (ANTIVERT) 25 MG tablet Take 1 tablet (25 mg total) by mouth 2 (two) times daily as needed for dizziness or nausea. 02/17/16   Marily Memos, MD   Triage Vitals: BP 172/83 mmHg  Pulse 86  Temp(Src) 98.4 F (36.9 C) (Oral)  Resp 16  Wt 194 lb (87.998 kg)  SpO2 99% Physical Exam  Constitutional: He is oriented to person, place, and time. He appears well-developed and well-nourished.  HENT:  Head: Normocephalic and atraumatic.  Eyes: Conjunctivae are normal.  Neck: Neck supple.  Cardiovascular: Normal rate and regular rhythm.   Pulmonary/Chest: Effort normal and breath sounds normal.  Abdominal: Soft. Bowel sounds are normal.  Musculoskeletal: Normal range of motion.  Neurological: He is alert and oriented to person, place, and time.  Minimal weakness RUE.  Skin: Skin is warm and dry.  Psychiatric: He has a normal mood and affect. His behavior is normal.  Nursing note and vitals  reviewed.   ED Course  Procedures (including critical care time) DIAGNOSTIC STUDIES: Oxygen Saturation is 99% on RA, normal by my interpretation.   COORDINATION OF CARE: 11:16 AM- [facility]. Pt verbalizes understanding and agrees to plan.  Medications  sodium chloride 0.9 % bolus 500 mL (500 mLs Intravenous New Bag/Given 02/18/16 1138)    Labs Review Labs Reviewed  BASIC METABOLIC PANEL - Abnormal; Notable for the following:    Calcium 8.8 (*)    All other components within normal limits  CBC WITH DIFFERENTIAL/PLATELET    Imaging Review Dg Chest 2  View  02/17/2016  CLINICAL DATA:  Generalized weakness for 4 days EXAM: CHEST  2 VIEW COMPARISON:  March 26, 2012 chest radiograph and chest CT FINDINGS: There is no edema or consolidation. The heart size and pulmonary vascularity are normal. No adenopathy. There is degenerative change in the thoracic spine. IMPRESSION: No edema or consolidation. Electronically Signed   By: Bretta BangWilliam  Woodruff III M.D.   On: 02/17/2016 16:28   Ct Head Wo Contrast  02/17/2016  CLINICAL DATA:  Left hand tingling and weakness, initial encounter EXAM: CT HEAD WITHOUT CONTRAST TECHNIQUE: Contiguous axial images were obtained from the base of the skull through the vertex without intravenous contrast. COMPARISON:  None FINDINGS: Bony calvarium is intact. No gross soft tissue abnormality is noted. Few scattered areas of decreased attenuation are noted. One within the left frontal parietal region an a second within the right cerebellar hemisphere laterally. These are consistent with areas of prior ischemia. No findings to suggest acute hemorrhage, acute infarction or space-occupying mass lesion are noted. IMPRESSION: Chronic ischemic changes without acute abnormality. Electronically Signed   By: Alcide CleverMark  Lukens M.D.   On: 02/17/2016 16:54   I have personally reviewed and evaluated these images and lab results as part of my medical decision-making.   EKG Interpretation None     CRITICAL CARE Performed by: Donnetta HutchingOOK,Jalaila Caradonna Total critical care time: 30 minutes Critical care time was exclusive of separately billable procedures and treating other patients. Critical care was necessary to treat or prevent imminent or life-threatening deterioration. Critical care was time spent personally by me on the following activities: development of treatment plan with patient and/or surrogate as well as nursing, discussions with consultants, evaluation of patient's response to treatment, examination of patient, obtaining history from patient or surrogate,  ordering and performing treatments and interventions, ordering and review of laboratory studies, ordering and review of radiographic studies, pulse oximetry and re-evaluation of patient's condition. MDM   Final diagnoses:  Right arm weakness  Vertigo    Patient was evaluated yesterday for vertiginous symptoms. CT of head revealed ischemic changes but no acute abnormality. He returns today with persistent vertigo. Additionally, he has right upper extremity weakness since 8:30 AM this morning which has improved. Clinical [facility]. Will transfer patient.  Discussed with Dr. Conley RollsLe  I personally performed the services described in this documentation, which was scribed in my presence. The recorded information has been reviewed and is accurate.      Donnetta HutchingBrian Ozie Lupe, MD 02/18/16 1208

## 2016-02-18 NOTE — H&P (Signed)
[facility] History and Physical  Michael Cortez FAO:[id number] DOB: 08-28-1955    PCP:   No PCP Per Patient   Chief Complaint: non positional vertigo for 2 days and right upper extremity weakness for 1 hour today.   HPI: Michael Cortez is an 60 y.o. male with hx of HLD, tobacco use, DJD, presented to the ER yesterday with non positional vertigo, with no nausea and vomiting, and had negative head CT at that time, refused admission due to lack of insurance coverage, presented today with right upper extremity weakness, slurred speech with ictus 2 hours prior to arrival, having symptoms resolved in the ER.  He was not a candidate for TPA given resolution of his neuro symptoms.  EDP spoke with Dr Roxy Manns of neurology, who recommended admission for stroke work up.  Hospitlalist was asked to admit him for same.  He denied drug or alcohol usage, and had no prior similar symptoms in the past.   Rewiew of Systems:  Constitutional: Negative for malaise, fever and chills. No significant weight loss or weight gain Eyes: Negative for eye pain, redness and discharge, diplopia, visual changes, or flashes of light. ENMT: Negative for ear pain, hoarseness, nasal congestion, sinus pressure and sore throat. No headaches; tinnitus, drooling, or problem swallowing. Cardiovascular: Negative for chest pain, palpitations, diaphoresis, dyspnea and peripheral edema. ; No orthopnea, PND Respiratory: Negative for cough, hemoptysis, wheezing and stridor. No pleuritic chestpain. Gastrointestinal: Negative for nausea, vomiting, diarrhea, constipation, abdominal pain, melena, blood in stool, hematemesis, jaundice and rectal bleeding.    Genitourinary: Negative for frequency, dysuria, incontinence,flank pain and hematuria; Musculoskeletal: Negative for back pain and neck pain. Negative for swelling and trauma.;  Skin: . Negative for pruritus, rash, abrasions, bruising and skin lesion.; ulcerations Neuro: Negative for  headache, lightheadedness and neck stiffness. Negative for weakness, altered level of consciousness , altered mental status, extremity weakness, burning feet, involuntary movement, seizure and syncope.  Psych: negative for anxiety, depression, insomnia, tearfulness, panic attacks, hallucinations, paranoia, suicidal or homicidal ideation    Past Medical History  Diagnosis Date  . Anxiety and depression   . Chest pain 2005    03/2012: neg nuclear stress.  2005- Cardiac cath:20-40% lesions in the LAD, circumflex and RCA with normal EF  . Hyperlipidemia   . Tobacco abuse     40 pack year total consumption  . Degenerative joint disease     Past Surgical History  Procedure Laterality Date  . Appendectomy      Medications:  HOME MEDS: Prior to Admission medications   Medication Sig Start Date End Date Taking? Authorizing Provider  aspirin 81 MG chewable tablet Chew 1 tablet (81 mg total) by mouth daily. 02/17/16  Yes Marily Memos, MD  ibuprofen (ADVIL,MOTRIN) 600 MG tablet Take 1 tablet (600 mg total) by mouth every 8 (eight) hours as needed for pain. 03/13/13  Yes Deatra Canter, FNP     Allergies:  No Known Allergies  Social History:   reports that he has been smoking.  He does not have any smokeless tobacco history on file. He reports that he does not drink alcohol or use illicit drugs.  Family History: Family History  Problem Relation Age of Onset  . Coronary artery disease Father 88    Fatal myocardial infarction     Physical Exam: Filed Vitals:   02/18/16 1039 02/18/16 1045 02/18/16 1100 02/18/16 1130  BP: 172/83 164/88 147/91 152/89  Pulse: 86  72 76  Temp: 98.4 F (36.9  C)     TempSrc: Oral     Resp: Weight: 87.998 kg (194 lb)     SpO2: 99%  99% 97%   Blood pressure 152/89, pulse 76, temperature 98.4 F (36.9 C), temperature source Oral, resp. rate 22, weight 87.998 kg (194 lb), SpO2 97 %.  GEN:  Pleasant patient lying in the stretcher in no acute  distress; cooperative with exam. PSYCH:  alert and oriented x4; does not appear anxious or depressed; affect is appropriate. HEENT: Mucous membranes pink and anicteric; PERRLA; EOM intact; no cervical lymphadenopathy nor thyromegaly or carotid bruit; no JVD; There were no stridor. Neck is very supple. Breasts:: Not examined CHEST WALL: No tenderness CHEST: Normal respiration, clear to auscultation bilaterally.  HEART: Regular rate and rhythm.  There are no murmur, rub, or gallops.   BACK: No kyphosis or scoliosis; no CVA tenderness ABDOMEN: soft and non-tender; no masses, no organomegaly, normal abdominal bowel sounds; no pannus; no intertriginous candida. There is no rebound and no distention. Rectal Exam: Not done EXTREMITIES: No bone or joint deformity; age-appropriate arthropathy of the hands and knees; no edema; no ulcerations.  There is no calf tenderness. Genitalia: not examined PULSES: 2+ and symmetric SKIN: Normal hydration no rash or ulceration CNS: Cranial nerves 2-12 grossly intact no focal lateralizing neurologic deficit.  Speech is fluent; uvula elevated with phonation, facial symmetry and tongue midline. DTR are normal bilaterally, cerebella exam is intact, barbinski is negative and strengths are equaled bilaterally.  No sensory loss.   Labs on Admission:  Basic Metabolic Panel:  Recent Labs Lab 02/17/16 1507 02/18/16 1118  NA 136 137  K 3.7 3.6  CL 104 107  CO2 20* 24  GLUCOSE 117* 94  BUN 17 12  CREATININE 1.19 1.23  CALCIUM 9.0 8.8*   Liver Function Tests:  Recent Labs Lab 02/17/16 1507  AST 18  ALT 16*  ALKPHOS 71  BILITOT 0.5  PROT 6.7  ALBUMIN 4.3   CBC:  Recent Labs Lab 02/17/16 1507 02/18/16 1118  WBC 10.1 8.0  NEUTROABS 7.6 5.3  HGB 14.8 14.6  HCT 41.8 41.0  MCV 94.8 95.3  PLT 198 222   Cardiac Enzymes:  Recent Labs Lab 02/17/16 1507  TROPONINI <0.03    CBG: No results for input(s): GLUCAP in the last 168  hours.   Radiological Exams on Admission: Dg Chest 2 View  02/17/2016  CLINICAL DATA:  Generalized weakness for 4 days EXAM: CHEST  2 VIEW COMPARISON:  March 26, 2012 chest radiograph and chest CT FINDINGS: There is no edema or consolidation. The heart size and pulmonary vascularity are normal. No adenopathy. There is degenerative change in the thoracic spine. IMPRESSION: No edema or consolidation. Electronically Signed   By: Bretta Bang III M.D.   On: 02/17/2016 16:28   Ct Head Wo Contrast  02/17/2016  CLINICAL DATA:  Left hand tingling and weakness, initial encounter EXAM: CT HEAD WITHOUT CONTRAST TECHNIQUE: Contiguous axial images were obtained from the base of the skull through the vertex without intravenous contrast. COMPARISON:  None FINDINGS: Bony calvarium is intact. No gross soft tissue abnormality is noted. Few scattered areas of decreased attenuation are noted. One within the left frontal parietal region an a second within the right cerebellar hemisphere laterally. These are consistent with areas of prior ischemia. No findings to suggest acute hemorrhage, acute infarction or space-occupying mass lesion are noted. IMPRESSION: Chronic ischemic changes without acute abnormality. Electronically Signed   By:  Alcide CleverMark  Lukens M.D.   On: 02/17/2016 16:54    EKG: Independently reviewed.    Assessment/Plan Present on Admission:  . Acute CVA (cerebrovascular accident) (HCC) . Hyperlipidemia . Tobacco abuse . Right arm weakness  PLAN:  Acute CVA/TIA:  I suspect his vertigo is posterior circulation insufficiency.  Could repeat a CT of the head today, but we will proceed with MRI/MRA, along with carotid and ECHO.  Please let Dr Roxy Mannsster of neurology know upon arrival for consultation.  Will begin ASA and a statin.  Obtain lipid panel in the am.    HLD:  Start lipitor.  HTN:  Will hold off on treatment during acute CVA.  Tobacco abuse:  Advised Stop.   Other plans as per orders. Code  Status: FULL Unk LightningODE.   Jeniece Hannis, MD. FACP [facility] Pager [phone] 7pm to 7am.  02/18/2016, 12:25 PM

## 2016-02-19 ENCOUNTER — Other Ambulatory Visit: Payer: Self-pay | Admitting: Neurology

## 2016-02-19 ENCOUNTER — Encounter (HOSPITAL_COMMUNITY): Payer: Self-pay | Admitting: Family Medicine

## 2016-02-19 ENCOUNTER — Observation Stay (HOSPITAL_BASED_OUTPATIENT_CLINIC_OR_DEPARTMENT_OTHER): Payer: Self-pay

## 2016-02-19 DIAGNOSIS — R29898 Other symptoms and signs involving the musculoskeletal system: Secondary | ICD-10-CM

## 2016-02-19 DIAGNOSIS — Z720 Tobacco use: Secondary | ICD-10-CM

## 2016-02-19 DIAGNOSIS — I639 Cerebral infarction, unspecified: Secondary | ICD-10-CM

## 2016-02-19 DIAGNOSIS — I6509 Occlusion and stenosis of unspecified vertebral artery: Secondary | ICD-10-CM | POA: Diagnosis present

## 2016-02-19 DIAGNOSIS — G459 Transient cerebral ischemic attack, unspecified: Secondary | ICD-10-CM

## 2016-02-19 DIAGNOSIS — E785 Hyperlipidemia, unspecified: Secondary | ICD-10-CM

## 2016-02-19 DIAGNOSIS — I6501 Occlusion and stenosis of right vertebral artery: Secondary | ICD-10-CM

## 2016-02-19 DIAGNOSIS — I10 Essential (primary) hypertension: Secondary | ICD-10-CM

## 2016-02-19 DIAGNOSIS — I63211 Cerebral infarction due to unspecified occlusion or stenosis of right vertebral arteries: Principal | ICD-10-CM

## 2016-02-19 LAB — LIPID PANEL
Cholesterol: 196 mg/dL (ref 0–200)
HDL: 47 mg/dL (ref >40)
LDL Cholesterol: 129 mg/dL — ABNORMAL HIGH (ref 0–99)
Total CHOL/HDL Ratio: 4.2 RATIO
Triglycerides: 100 mg/dL (ref <150)
VLDL: 20 mg/dL (ref 0–40)

## 2016-02-19 LAB — ECHOCARDIOGRAM COMPLETE: Weight: 3104 oz

## 2016-02-19 MED ORDER — CLOPIDOGREL BISULFATE 75 MG PO TABS: 75.0000 mg | ORAL_TABLET | Freq: Every day | ORAL | Status: DC

## 2016-02-19 MED ORDER — PERFLUTREN LIPID MICROSPHERE
INTRAVENOUS | Status: AC
  Administered 2016-02-19: 2 mL
  Filled 2016-02-19: qty 10

## 2016-02-19 MED ORDER — ACETAMINOPHEN 325 MG PO TABS
650.0000 mg | ORAL_TABLET | Freq: Four times a day (QID) | ORAL | Status: DC | PRN
  Administered 2016-02-19: 650 mg via ORAL
  Filled 2016-02-19: qty 2

## 2016-02-19 MED ORDER — ATORVASTATIN CALCIUM 40 MG PO TABS: 40.0000 mg | ORAL_TABLET | Freq: Every day | ORAL | Status: DC

## 2016-02-19 NOTE — Progress Notes (Signed)
Pt asking nurse when echo will be performed,rn stated she was not sure if it would be done today or tomorrow. Pt saying he is leaving today no matter what because he needs to take care of his wife.  md being paged.

## 2016-02-19 NOTE — Progress Notes (Signed)
  Echocardiogram 2D Echocardiogram with Definity has been performed.  Leta JunglingCooper, Kamilla Hands M 02/19/2016, 3:00 PM

## 2016-02-19 NOTE — Progress Notes (Signed)
Notified by RN that patient refusing to stay in the hospital for further work up - says he must get home to care for wife who is ill.  I spoke with stroke team neurologist.  He will need to follow up outpatient. He will need to get 30 day event monitor arranged through neurology.  He should go home on plavix and atorvastatin.  Pt adamant that he is not going to stay.  He will need to follow up with neurology for final Echo results and final results of tests ordered in the hospital.  The risks of leaving hospital before full work up complete explained to patient.  Pt verbalizes understanding.   Philippa Chester. Johnson,MD

## 2016-02-19 NOTE — Discharge Instructions (Signed)
Ischemic Stroke Treated Without Warfarin An ischemic stroke (cerebrovascular accident) is the sudden death of brain tissue. It is a medical emergency. An ischemic stroke can cause permanent loss of brain function. This can cause problems with different parts of your body. CAUSES An ischemic stroke is caused by a decrease of oxygen supply to an area of your brain. It is usually the result of a small blood clot (embolus) or collection of cholesterol or fat (plaque) that blocks blood flow in the brain. An ischemic stroke can also be caused by blocked or damaged carotid arteries. RISK FACTORS  High blood pressure (hypertension).  High cholesterol.  Diabetes mellitus.  Heart disease.  The buildup of plaque in the blood vessels (peripheral artery disease or atherosclerosis).  The buildup of plaque in the blood vessels that provide blood and oxygen to the brain (carotid artery stenosis).  An abnormal heart rhythm (atrial fibrillation).  Obesity.  Smoking cigarettes.  Taking oral contraceptives, especially in combination with using tobacco.  Physical inactivity.  A diet that is high in fats, salt (sodium), and calories.  Excessive alcohol use.  Use of illegal drugs, especially cocaine and methamphetamine.  Being African American.  Being over the age of 24 years.  Family history of stroke.  Previous history of blood clots, stroke, TIA (transient ischemic attack), or heart attack.  Sickle cell disease. SIGNS AND SYMPTOMS These symptoms usually develop suddenly, or you may notice them after waking up from sleep. Symptoms may include sudden:  Weakness or numbness in your face, arm, or leg, especially on one side of your body.  Confusion.  Trouble speaking (aphasia) or understanding speech.  Trouble seeing with one or both eyes.  Trouble walking or difficulty moving your arms or legs.  Dizziness.  Loss of balance or coordination.  Severe headache with no known cause.  The headache is often described as the worst headache ever experienced. DIAGNOSIS Your health care provider can often determine the presence or absence of an ischemic stroke based on your symptoms, history, and physical exam. CT (computed tomography) of the brain is usually performed to confirm the stroke, determine causes, and determine stroke severity. Other tests may be done to find the cause of the stroke. These tests may include:  ECG (electrocardiogram).  Continuous heart monitoring.  Echocardiogram.  Carotid ultrasound.  MRI.  A scan of the brain circulation.  Blood tests. TREATMENT It is very important to seek treatment at the first sign of stroke symptoms. Your health care provider may perform the following treatments within 6 hours of the onset of stroke symptoms:  Medicine to dissolve the blood clot (thrombolytic).  Inserting a device into the affected artery to remove the blood clot. These treatments may not be effective if too much time has passed since your stroke symptoms began. Even if you do not know when your symptoms began, get treatment as soon as possible. There are other treatment options that may be given, such as:  Oxygen.  IV fluids.  Medicines to thin the blood (anticoagulants).  A procedure to widen blocked arteries. Your treatment will depend on how long you have had your symptoms, the severity of your symptoms, and the cause of your symptoms. Your health care provider will take measures to prevent short-term and long-term complications of stroke, such as:  Breathing foreign material into the lungs (aspiration pneumonia).  Blood clots in the legs.  Bedsores.  Falls. Medicines and dietary changes may be used to help treat and manage risk factors for  stroke, such as diabetes and high blood pressure. If any of your body's functions were impaired by stroke, you may work with physical, speech, or occupational therapists to help you recover. HOME CARE  INSTRUCTIONS  Take medicines only as directed by your health care provider. Follow the directions carefully. Medicines may be used to control risk factors for a stroke. Be sure that you understand all your medicine instructions.  If swallow studies have determined that your swallowing reflex is present, you should eat healthy foods. Foods may need to be a soft or pureed consistency, or you may need to take small bites in order to avoid aspirating or choking.  Follow physical activity guidelines as directed by your health care team.  Do not use any tobacco products, including cigarettes, chewing tobacco, or electronic cigarettes. If you smoke, quit. If you need help quitting, ask your health care provider.  Limit or stop alcohol use.  A safe home environment is important to reduce the risk of falls. Your health care provider may arrange for specialists to evaluate your home. Having grab bars in the bedroom and bathroom is often important. Your health care provider may arrange for equipment to be used at home, such as raised toilets and a seat for the shower.  Ongoing physical, occupational, and speech therapy may be needed to maximize your recovery after a stroke. If you have been advised to use a walker or a cane, use it at all times. Be sure to keep your therapy appointments.  Keep all follow-up visits with your health care provider. This is very important. This includes any referrals, therapy, rehabilitation, and lab tests. Proper follow-up can prevent another stroke from occurring. PREVENTION The risk of a stroke can be decreased by appropriately treating high blood pressure, high cholesterol, diabetes, heart disease, and obesity. It can also be decreased by quitting smoking, limiting alcohol, and staying physically active. SEEK IMMEDIATE MEDICAL CARE IF:  You have sudden weakness or numbness in your face, arm, or leg, especially on one side of your body.  You have sudden confusion.  You  have sudden trouble speaking (aphasia) or understanding.  You have sudden trouble seeing with one or both eyes.  You have sudden trouble walking or difficulty moving your arms or legs.  You have sudden dizziness.  You have a sudden loss of balance or coordination.  You have a sudden, severe headache with no known cause.  You have a partial or total loss of consciousness. Any of these symptoms may represent a serious problem that is an emergency. Do not wait to see if the symptoms will go away. Get medical help right away. Call your local emergency services (911 in [place] Do not drive yourself to the hospital.   This information is not intended to replace advice given to you by your health care provider. Make sure you discuss any questions you have with your health care provider.   Document Released: 05/14/2014 Document Reviewed: 05/14/2014 Elsevier Interactive Patient Education [employer]     Stroke Prevention Some health problems and behaviors may make it more likely for you to have a stroke. Below are ways to lessen your risk of having a stroke.   Be active for at least 30 minutes on most or all days.  Do not smoke. Try not to be around others who smoke.  Do not drink too much alcohol.  Do not have more than 2 drinks a day if you are a man.  Do not have  more than 1 drink a day if you are a woman and are not pregnant.  Eat healthy foods, such as fruits and vegetables. If you were put on a specific diet, follow the diet as told.  Keep your cholesterol levels under control through diet and medicines. Look for foods that are low in saturated fat, trans fat, cholesterol, and are high in fiber.  If you have diabetes, follow all diet plans and take your medicine as told.  Ask your doctor if you need treatment to lower your blood pressure. If you have high blood pressure (hypertension), follow all diet plans and take your medicine as told by your doctor.  If you are  43-67 years old, have your blood pressure checked every 3-5 years. If you are age 54 or older, have your blood pressure checked every year.  Keep a healthy weight. Eat foods that are low in calories, salt, saturated fat, trans fat, and cholesterol.  Do not take drugs.  Avoid birth control pills, if this applies. Talk to your doctor about the risks of taking birth control pills.  Talk to your doctor if you have sleep problems (sleep apnea).  Take all medicine as told by your doctor.  You may be told to take aspirin or blood thinner medicine. Take this medicine as told by your doctor.  Understand your medicine instructions.  Make sure any other conditions you have are being taken care of. GET HELP RIGHT AWAY IF:  You suddenly lose feeling (you feel numb) or have weakness in your face, arm, or leg.  Your face or eyelid hangs down to one side.  You suddenly feel confused.  You have trouble talking (aphasia) or understanding what people are saying.  You suddenly have trouble seeing in one or both eyes.  You suddenly have trouble walking.  You are dizzy.  You lose your balance or your movements are clumsy (uncoordinated).  You suddenly have a very bad headache and you do not know the cause.  You have new chest pain.  Your heart feels like it is fluttering or skipping a beat (irregular heartbeat). Do not wait to see if the symptoms above go away. Get help right away. Call your local emergency services (911 in [place] Do not drive yourself to the hospital.   This information is not intended to replace advice given to you by your health care provider. Make sure you discuss any questions you have with your health care provider.   Document Released: 01/29/2012 Document Revised: 08/20/2014 Document Reviewed: 01/30/2013 Elsevier Interactive Patient Education 2016 [employer].    Smoking Cessation, Tips for Success If you are ready to quit smoking, congratulations! You have  chosen to help yourself be healthier. Cigarettes bring nicotine, tar, carbon monoxide, and other irritants into your body. Your lungs, heart, and blood vessels will be able to work better without these poisons. There are many different ways to quit smoking. Nicotine gum, nicotine patches, a nicotine inhaler, or nicotine nasal spray can help with physical craving. Hypnosis, support groups, and medicines help break the habit of smoking. WHAT THINGS CAN I DO TO MAKE QUITTING EASIER?  Here are some tips to help you quit for good:  Pick a date when you will quit smoking completely. Tell all of your friends and family about your plan to quit on that date.  Do not try to slowly cut down on the number of cigarettes you are smoking. Pick a quit date and quit smoking completely starting on that  day.  Throw away all cigarettes.   Clean and remove all ashtrays from your home, work, and car.  On a card, write down your reasons for quitting. Carry the card with you and read it when you get the urge to smoke.  Cleanse your body of nicotine. Drink enough water and fluids to keep your urine clear or pale yellow. Do this after quitting to flush the nicotine from your body.  Learn to predict your moods. Do not let a bad situation be your excuse to have a cigarette. Some situations in your life might tempt you into wanting a cigarette.  Never have "just one" cigarette. It leads to wanting another and another. Remind yourself of your decision to quit.  Change habits associated with smoking. If you smoked while driving or when feeling stressed, try other activities to replace smoking. Stand up when drinking your coffee. Brush your teeth after eating. Sit in a different chair when you read the paper. Avoid alcohol while trying to quit, and try to drink fewer caffeinated beverages. Alcohol and caffeine may urge you to smoke.  Avoid foods and drinks that can trigger a desire to smoke, such as sugary or spicy foods and  alcohol.  Ask people who smoke not to smoke around you.  Have something planned to do right after eating or having a cup of coffee. For example, plan to take a walk or exercise.  Try a relaxation exercise to calm you down and decrease your stress. Remember, you may be tense and nervous for the first 2 weeks after you quit, but this will pass.  Find new activities to keep your hands busy. Play with a pen, coin, or rubber band. Doodle or draw things on paper.  Brush your teeth right after eating. This will help cut down on the craving for the taste of tobacco after meals. You can also try mouthwash.   Use oral substitutes in place of cigarettes. Try using lemon drops, carrots, cinnamon sticks, or chewing gum. Keep them handy so they are available when you have the urge to smoke.  When you have the urge to smoke, try deep breathing.  Designate your home as a nonsmoking area.  If you are a heavy smoker, ask your health care provider about a prescription for nicotine chewing gum. It can ease your withdrawal from nicotine.  Reward yourself. Set aside the cigarette money you save and buy yourself something nice.  Look for support from others. Join a support group or smoking cessation program. Ask someone at home or at work to help you with your plan to quit smoking.  Always ask yourself, "Do I need this cigarette or is this just a reflex?" Tell yourself, "Today, I choose not to smoke," or "I do not want to smoke." You are reminding yourself of your decision to quit.  Do not replace cigarette smoking with electronic cigarettes (commonly called e-cigarettes). The safety of e-cigarettes is unknown, and some may contain harmful chemicals.  If you relapse, do not give up! Plan ahead and think about what you will do the next time you get the urge to smoke. HOW WILL I FEEL WHEN I QUIT SMOKING? You may have symptoms of withdrawal because your body is used to nicotine (the addictive substance in  cigarettes). You may crave cigarettes, be irritable, feel very hungry, cough often, get headaches, or have difficulty concentrating. The withdrawal symptoms are only temporary. They are strongest when you first quit but will go away within 10-14  days. When withdrawal symptoms occur, stay in control. Think about your reasons for quitting. Remind yourself that these are signs that your body is healing and getting used to being without cigarettes. Remember that withdrawal symptoms are easier to treat than the major diseases that smoking can cause.  Even after the withdrawal is over, expect periodic urges to smoke. However, these cravings are generally short lived and will go away whether you smoke or not. Do not smoke! WHAT RESOURCES ARE AVAILABLE TO HELP ME QUIT SMOKING? Your health care provider can direct you to community resources or hospitals for support, which may include:  Group support.  Education.  Hypnosis.  Therapy.   This information is not intended to replace advice given to you by your health care provider. Make sure you discuss any questions you have with your health care provider.   Document Released: 04/27/2004 Document Revised: 08/20/2014 Document Reviewed: 01/15/2013 Elsevier Interactive Patient Education 2016 [employer].  Steps to Quit Smoking  Smoking tobacco can be harmful to your health and can affect almost every organ in your body. Smoking puts you, and those around you, at risk for developing many serious chronic diseases. Quitting smoking is difficult, but it is one of the best things that you can do for your health. It is never too late to quit. WHAT ARE THE BENEFITS OF QUITTING SMOKING? When you quit smoking, you lower your risk of developing serious diseases and conditions, such as:  Lung cancer or lung disease, such as COPD.  Heart disease.  Stroke.  Heart attack.  Infertility.  Osteoporosis and bone fractures. Additionally, symptoms such as coughing,  wheezing, and shortness of breath may get better when you quit. You may also find that you get sick less often because your body is stronger at fighting off colds and infections. If you are pregnant, quitting smoking can help to reduce your chances of having a baby of low birth weight. HOW DO I GET READY TO QUIT? When you decide to quit smoking, create a plan to make sure that you are successful. Before you quit:  Pick a date to quit. Set a date within the next two weeks to give you time to prepare.  Write down the reasons why you are quitting. Keep this list in places where you will see it often, such as on your bathroom mirror or in your car or wallet.  Identify the people, places, things, and activities that make you want to smoke (triggers) and avoid them. Make sure to take these actions:  Throw away all cigarettes at home, at work, and in your car.  Throw away smoking accessories, such as Set designer.  Clean your car and make sure to empty the ashtray.  Clean your home, including curtains and carpets.  Tell your family, friends, and coworkers that you are quitting. Support from your loved ones can make quitting easier.  Talk with your health care provider about your options for quitting smoking.  Find out what treatment options are covered by your health insurance. WHAT STRATEGIES CAN I USE TO QUIT SMOKING?  Talk with your healthcare provider about different strategies to quit smoking. Some strategies include:  Quitting smoking altogether instead of gradually lessening how much you smoke over a period of time. Research shows that quitting "cold [place]" is more successful than gradually quitting.  Attending in-person counseling to help you build problem-solving skills. You are more likely to have success in quitting if you attend several counseling sessions. Even short  sessions of 10 minutes can be effective.  Finding resources and support systems that can help you to quit  smoking and remain smoke-free after you quit. These resources are most helpful when you use them often. They can include:  Online chats with a Veterinary surgeon.  Telephone quitlines.  Printed Materials engineer.  Support groups or group counseling.  Text messaging programs.  Mobile phone applications.  Taking medicines to help you quit smoking. (If you are pregnant or breastfeeding, talk with your health care provider first.) Some medicines contain nicotine and some do not. Both types of medicines help with cravings, but the medicines that include nicotine help to relieve withdrawal symptoms. Your health care provider may recommend:  Nicotine patches, gum, or lozenges.  Nicotine inhalers or sprays.  Non-nicotine medicine that is taken by mouth. Talk with your health care provider about combining strategies, such as taking medicines while you are also receiving in-person counseling. Using these two strategies together makes you more likely to succeed in quitting than if you used either strategy on its own. If you are pregnant or breastfeeding, talk with your health care provider about finding counseling or other support strategies to quit smoking. Do not take medicine to help you quit smoking unless told to do so by your health care provider. WHAT THINGS CAN I DO TO MAKE IT EASIER TO QUIT? Quitting smoking might feel overwhelming at first, but there is a lot that you can do to make it easier. Take these important actions:  Reach out to your family and friends and ask that they support and encourage you during this time. Call telephone quitlines, reach out to support groups, or work with a counselor for support.  Ask people who smoke to avoid smoking around you.  Avoid places that trigger you to smoke, such as bars, parties, or smoke-break areas at work.  Spend time around people who do not smoke.  Lessen stress in your life, because stress can be a smoking trigger for some people. To  lessen stress, try:  Exercising regularly.  Deep-breathing exercises.  Yoga.  Meditating.  Performing a body scan. This involves closing your eyes, scanning your body from head to toe, and noticing which parts of your body are particularly tense. Purposefully relax the muscles in those areas.  Download or purchase mobile phone or tablet apps (applications) that can help you stick to your quit plan by providing reminders, tips, and encouragement. There are many free apps, such as QuitGuide from the [employer] developer for Disease Control and Prevention). You can find other support for quitting smoking (smoking cessation) through smokefree.gov and other websites. HOW WILL I FEEL WHEN I QUIT SMOKING? Within the first 24 hours of quitting smoking, you may start to feel some withdrawal symptoms. These symptoms are usually most noticeable 2-3 days after quitting, but they usually do not last beyond 2-3 weeks. Changes or symptoms that you might experience include:  Mood swings.  Restlessness, anxiety, or irritation.  Difficulty concentrating.  Dizziness.  Strong cravings for sugary foods in addition to nicotine.  Mild weight gain.  Constipation.  Nausea.  Coughing or a sore throat.  Changes in how your medicines work in your body.  A depressed mood.  Difficulty sleeping (insomnia). After the first 2-3 weeks of quitting, you may start to notice more positive results, such as:  Improved sense of smell and taste.  Decreased coughing and sore throat.  Slower heart rate.  Lower blood pressure.  Clearer skin.  The ability to  breathe more easily.  Fewer sick days. Quitting smoking is very challenging for most people. Do not get discouraged if you are not successful the first time. Some people need to make many attempts to quit before they achieve long-term success. Do your best to stick to your quit plan, and talk with your health care provider if you have any questions or  concerns.   This information is not intended to replace advice given to you by your health care provider. Make sure you discuss any questions you have with your health care provider.   Document Released: 07/24/2001 Document Revised: 12/14/2014 Document Reviewed: 12/14/2014 Elsevier Interactive Patient Education 2016 [employer].  Smoking Cessation, Tips for Success If you are ready to quit smoking, congratulations! You have chosen to help yourself be healthier. Cigarettes bring nicotine, tar, carbon monoxide, and other irritants into your body. Your lungs, heart, and blood vessels will be able to work better without these poisons. There are many different ways to quit smoking. Nicotine gum, nicotine patches, a nicotine inhaler, or nicotine nasal spray can help with physical craving. Hypnosis, support groups, and medicines help break the habit of smoking. WHAT THINGS CAN I DO TO MAKE QUITTING EASIER?  Here are some tips to help you quit for good:  Pick a date when you will quit smoking completely. Tell all of your friends and family about your plan to quit on that date.  Do not try to slowly cut down on the number of cigarettes you are smoking. Pick a quit date and quit smoking completely starting on that day.  Throw away all cigarettes.   Clean and remove all ashtrays from your home, work, and car.  On a card, write down your reasons for quitting. Carry the card with you and read it when you get the urge to smoke.  Cleanse your body of nicotine. Drink enough water and fluids to keep your urine clear or pale yellow. Do this after quitting to flush the nicotine from your body.  Learn to predict your moods. Do not let a bad situation be your excuse to have a cigarette. Some situations in your life might tempt you into wanting a cigarette.  Never have "just one" cigarette. It leads to wanting another and another. Remind yourself of your decision to quit.  Change habits associated with  smoking. If you smoked while driving or when feeling stressed, try other activities to replace smoking. Stand up when drinking your coffee. Brush your teeth after eating. Sit in a different chair when you read the paper. Avoid alcohol while trying to quit, and try to drink fewer caffeinated beverages. Alcohol and caffeine may urge you to smoke.  Avoid foods and drinks that can trigger a desire to smoke, such as sugary or spicy foods and alcohol.  Ask people who smoke not to smoke around you.  Have something planned to do right after eating or having a cup of coffee. For example, plan to take a walk or exercise.  Try a relaxation exercise to calm you down and decrease your stress. Remember, you may be tense and nervous for the first 2 weeks after you quit, but this will pass.  Find new activities to keep your hands busy. Play with a pen, coin, or rubber band. Doodle or draw things on paper.  Brush your teeth right after eating. This will help cut down on the craving for the taste of tobacco after meals. You can also try mouthwash.   Use oral substitutes  in place of cigarettes. Try using lemon drops, carrots, cinnamon sticks, or chewing gum. Keep them handy so they are available when you have the urge to smoke.  When you have the urge to smoke, try deep breathing.  Designate your home as a nonsmoking area.  If you are a heavy smoker, ask your health care provider about a prescription for nicotine chewing gum. It can ease your withdrawal from nicotine.  Reward yourself. Set aside the cigarette money you save and buy yourself something nice.  Look for support from others. Join a support group or smoking cessation program. Ask someone at home or at work to help you with your plan to quit smoking.  Always ask yourself, "Do I need this cigarette or is this just a reflex?" Tell yourself, "Today, I choose not to smoke," or "I do not want to smoke." You are reminding yourself of your decision to  quit.  Do not replace cigarette smoking with electronic cigarettes (commonly called e-cigarettes). The safety of e-cigarettes is unknown, and some may contain harmful chemicals.  If you relapse, do not give up! Plan ahead and think about what you will do the next time you get the urge to smoke. HOW WILL I FEEL WHEN I QUIT SMOKING? You may have symptoms of withdrawal because your body is used to nicotine (the addictive substance in cigarettes). You may crave cigarettes, be irritable, feel very hungry, cough often, get headaches, or have difficulty concentrating. The withdrawal symptoms are only temporary. They are strongest when you first quit but will go away within 10-14 days. When withdrawal symptoms occur, stay in control. Think about your reasons for quitting. Remind yourself that these are signs that your body is healing and getting used to being without cigarettes. Remember that withdrawal symptoms are easier to treat than the major diseases that smoking can cause.  Even after the withdrawal is over, expect periodic urges to smoke. However, these cravings are generally short lived and will go away whether you smoke or not. Do not smoke! WHAT RESOURCES ARE AVAILABLE TO HELP ME QUIT SMOKING? Your health care provider can direct you to community resources or hospitals for support, which may include:  Group support.  Education.  Hypnosis.  Therapy.   This information is not intended to replace advice given to you by your health care provider. Make sure you discuss any questions you have with your health care provider.   Document Released: 04/27/2004 Document Revised: 08/20/2014 Document Reviewed: 01/15/2013 Elsevier Interactive Patient Education 2016 [employer]  Clopidogrel tablets What is this medicine? CLOPIDOGREL (kloh PID oh grel) helps to prevent blood clots. This medicine is used to prevent heart attack, stroke, or other vascular events in people who are at high risk. This  medicine may be used for other purposes; ask your health care provider or pharmacist if you have questions. What should I tell my health care provider before I take this medicine? They need to know if you have any of the following conditions: -bleeding disorder -bleeding in the brain -planned surgery -stomach or intestinal ulcers -stroke or transient ischemic attack -an unusual or allergic reaction to clopidogrel, other medicines, foods, dyes, or preservatives -pregnant or trying to get pregnant -breast-feeding How should I use this medicine? Take this medicine by mouth with a drink of water. Follow the directions on the prescription label. You may take this medicine with or without food. Take your medicine at regular intervals. Do not take your medicine more often than directed. Talk  to your pediatrician regarding the use of this medicine in children. Special care may be needed. Overdosage: If you think you have taken too much of this medicine contact a poison control center or emergency room at once. NOTE: This medicine is only for you. Do not share this medicine with others. What if I miss a dose? If you miss a dose, take it as soon as you can. If it is almost time for your next dose, take only that dose. Do not take double or extra doses. What may interact with this medicine? -aspirin -blood thinners like cilostazol, enoxaparin, ticlopidine, and warfarin -certain medicines for depression like citalopram, fluoxetine, and fluvoxamine -certain medicines for fungal infections like ketoconazole, fluconazole, and voriconazole -certain medicines for HIV infection like delavirdine, efavirenz, and etravirine -certain medicines for seizures like felbamate, oxcarbazepine, and phenytoin -chloramphenicol -fluvastatin -isoniazid, INH -medicines for inflammation like ibuprofen and naproxen -modafinil -nicardipine -over-the counter supplements like echinacea, feverfew, fish oil, garlic, ginger,  ginkgo, green tea, horse chestnut -quinine -stomach acid blockers like cimetidine, omeprazole, and esomeprazole -tamoxifen -tolbutamide -topiramate -torsemide This list may not describe all possible interactions. Give your health care provider a list of all the medicines, herbs, non-prescription drugs, or dietary supplements you use. Also tell them if you smoke, drink alcohol, or use illegal drugs. Some items may interact with your medicine. What should I watch for while using this medicine? Visit your doctor or health care professional for regular check ups. Do not stop taking your medicine unless your doctor tells you to. Notify your doctor or health care professional and seek emergency treatment if you develop breathing problems; changes in vision; chest pain; severe, sudden headache; pain, swelling, warmth in the leg; trouble speaking; sudden numbness or weakness of the face, arm or leg. These can be signs that your condition has gotten worse. If you are going to have surgery or dental work, tell your doctor or health care professional that you are taking this medicine. Certain genetic factors may reduce the effect of this medicine. Your doctor may use genetic tests to determine treatment. What side effects may I notice from receiving this medicine? Side effects that you should report to your doctor or health care professional as soon as possible: -allergic reactions like skin rash, itching or hives, swelling of the face, lips, or tongue -breathing problems -changes in vision -fever -signs and symptoms of bleeding such as bloody or black, tarry stools; red or dark-brown urine; spitting up blood or brown material that looks like coffee grounds; red spots on the skin; unusual bruising or bleeding from the eye, gums, or nose -sudden weakness -unusual bleeding or bruising Side effects that usually do not require medical attention (report to your doctor or health care professional if they  continue or are bothersome): -constipation or diarrhea -headache -pain in back or joints -stomach upset This list may not describe all possible side effects. Call your doctor for medical advice about side effects. You may report side effects to FDA at 1-800-FDA-1088. Where should I keep my medicine? Keep out of the reach of children. Store at room temperature of 59 to 86 degrees F (15 to 30 degrees C). Throw away any unused medicine after the expiration date. NOTE: This sheet is a summary. It may not cover all possible information. If you have questions about this medicine, talk to your doctor, pharmacist, or health care provider.    2016, Elsevier/Gold Standard. (2012-11-25 16:34:37)

## 2016-02-19 NOTE — Progress Notes (Signed)
STROKE TEAM PROGRESS NOTE   HISTORY OF PRESENT ILLNESS (per record) Michael Cortez is an 60 y.o. male with a history of hyperlipidemia and ongoing tobacco use who has been experiencing waxing and waning symptoms of non-positional vertigo, headache, slurred speech, and right upper extremity weakness for approximately one week. The patient was seen at the [facility] emergency department yesterday where a CT scan of the head was reportedly negative. The patient declined admission as he cares for his wife at home who has breast cancer and they have no medical insurance. He was started on aspirin 81 mg daily at that time. The patient initially felt better this morning but as the day progressed he once again had balance problems and difficulty walking. He went back to the emergency department, had a repeat CT scan of the head, and was transferred to [facility] for further evaluation and treatment. The patient feels he is almost back to baseline although he has not attempted ambulation recently.  Date last known well: 02/11/2016 Time last known well: Unable to determine tPA Given: No: Late presentation   SUBJECTIVE (INTERVAL HISTORY) His RN is at the bedside.  Overall he feels his condition is completely resolved. He is eager to go home. No acute event overnight.    OBJECTIVE Temp:  [98.1 F (36.7 C)-98.6 F (37 C)] 98.6 F (37 C) (07/09 0517) Pulse Rate:  [63-86] 76 (07/09 0517) Cardiac Rhythm:  [-] Normal sinus rhythm (07/08 1452) Resp:  [7-22] 16 (07/09 0517) BP: (109-174)/(80-112) 147/81 mmHg (07/09 0517) SpO2:  [96 %-99 %] 99 % (07/09 0517) Weight:  [87.998 kg (194 lb)] 87.998 kg (194 lb) (07/08 1039)  CBC:  Recent Labs Lab 02/17/16 1507 02/18/16 1118 02/18/16 1655  WBC 10.1 8.0 6.6  NEUTROABS 7.6 5.3  --   HGB 14.8 14.6 14.1  HCT 41.8 41.0 40.7  MCV 94.8 95.3 96.4  PLT 198 222 210    Basic Metabolic Panel:  Recent Labs Lab 02/17/16 1507 02/18/16 1118  02/18/16 1655  NA 136 137  --   K 3.7 3.6  --   CL 104 107  --   CO2 20* 24  --   GLUCOSE 117* 94  --   BUN 17 12  --   CREATININE 1.19 1.23 1.25*  CALCIUM 9.0 8.8*  --     Lipid Panel:    Component Value Date/Time   CHOL 207* 03/12/2013 1615   CHOL 161 05/01/2012 1410   TRIG 87 03/12/2013 1615   TRIG 66 05/01/2012 1410   HDL 47 03/12/2013 1615   HDL 55 05/01/2012 1410   CHOLHDL 2.9 05/01/2012 1410   VLDL 13 05/01/2012 1410   LDLCALC 143* 03/12/2013 1615   LDLCALC 93 05/01/2012 1410   HgbA1c:  Lab Results  Component Value Date   HGBA1C 5.1% 03/12/2013   Urine Drug Screen: No results found for: LABOPIA, COCAINSCRNUR, LABBENZ, AMPHETMU, THCU, LABBARB    IMAGING I have personally reviewed the radiological images below and agree with the radiology interpretations.  Dg Chest 2 View 02/17/2016   No edema or consolidation.   Ct Head Wo Contrast 02/18/2016   There is abnormal hypodensity in the right cerebellum (series 2, image 5). This is seen on MRI today to reflect acute right PICA infarct (please see that report for details).  Chronic ischemic changes without acute abnormality.   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  No distal reconstitution.   Mr Maxine Glenn Head/brain Wo Cm 02/18/2016   1. Positive for emergent large vessel occlusion: Right vertebral artery, with associated acute right PICA infarcts (right lateral medulla and right cerebellum). No associated hemorrhage or mass effect.  2. Small associated acute infarcts in the left occipital lobe.   TEE - Left ventricle: The cavity size was normal. Systolic function was  normal. The estimated ejection fraction was in the range of 55%  to 60%. Wall motion was normal; there were no regional wall  motion abnormalities. - Aortic valve: Trileaflet; mildly thickened, mildly calcified  leaflets. There was trivial regurgitation.   PHYSICAL  EXAM  Temp:  [97.6 F (36.4 C)-99 F (37.2 C)] 97.6 F (36.4 C) (07/09 1331) Pulse Rate:  [63-76] 72 (07/09 1331) Resp:  [15-18] 18 (07/09 1331) BP: (130-147)/(72-112) 130/72 mmHg (07/09 1331) SpO2:  [96 %-100 %] 100 % (07/09 1331)  General - Well nourished, well developed, in no apparent distress.  Ophthalmologic - Sharp disc margins OU.   Cardiovascular - Regular rate and rhythm.  Mental Status -  Level of arousal and orientation to time, place, and person were intact. Language including expression, naming, repetition, comprehension was assessed and found intact. Fund of Knowledge was assessed and was intact.  Cranial Nerves II - XII - II - Visual field intact OU. III, IV, VI - Extraocular movements intact. V - Facial sensation intact bilaterally. VII - Facial movement intact bilaterally. VIII - Hearing & vestibular intact bilaterally, no nystagmus. X - Palate elevates symmetrically. XI - Chin turning & shoulder shrug intact bilaterally. XII - Tongue protrusion intact.  Motor Strength - The patient's strength was normal in all extremities and pronator drift was absent.  Bulk was normal and fasciculations were absent.   Motor Tone - Muscle tone was assessed at the neck and appendages and was normal.  Reflexes - The patient's reflexes were 1+ in all extremities and he had no pathological reflexes.  Sensory - Light touch, temperature/pinprick, vibration and proprioception, and Romberg testing were assessed and were symmetrical.    Coordination - The patient had normal movements in the hands and feet with no ataxia or dysmetria.  Tremor was absent.  Gait and Station - The patient's transfers, posture, gait, station, and turns were observed as normal.   ASSESSMENT/PLAN Mr. DONNA SILVERMAN is a 60 y.o. male with history of hyperlipidemia anxiety, depression, and tobacco use presenting with vertigo, headache, slurred speech, and right upper extremity weakness.  He did not  receive IV t-PA due to late presentation.  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 However, cardioembolic can not be excluded at this time.  Resultant  Deficit resolved.  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 Recommend to change to plavix for stroke prevention.  Recommend 30 day cardiac event monitoring to rule out afib  Patient counseled to be compliant with his antithrombotic medications  Ongoing aggressive stroke risk factor management  Therapy recommendations: Pending  Disposition: Pending  Hypertension  Stable  Permissive hypertension (OK if < 220/120) but gradually normalize in 5-7 days  Long-term BP goal normotensive  Hyperlipidemia  Home meds: No lipid lowering medications prior to admission  LDL 129, goal < 70  Now on Lipitor 40 mg daily  Continue statin at discharge  Tobacco abuse  Current smoker  Smoking cessation counseling provided  Pt is willing to quit  Other Stroke Risk Factors  Advanced age  Obesity, Body mass index is 32.28 kg/(m^2)., recommend weight loss, diet and exercise as appropriate   Family hx stroke (father)  Other Active Problems  Slightly elevated creatinine 1.25  Possible right vertebral artery dissection - pt denies any head or neck trauma  Hospital day # 1  Neurology will sign off. Please call with questions. Pt [facility] months. Thanks for the consult.  Marvel PlanJindong Pebble Botkin, MD PhD Stroke Neurology 02/19/2016 10:43 PM    To contact Stroke Continuity provider, please refer to [email or web address]. After hours,  contact General Neurology

## 2016-02-19 NOTE — Progress Notes (Signed)
rn spoke with echo, which was able to fit in pt at this time.

## 2016-02-19 NOTE — Progress Notes (Signed)
PROGRESS NOTE    Michael Cortez  ZOX:[id number]  DOB: 1955/10/12  DOA: 02/18/2016 PCP: No PCP Per Patient Outpatient Specialists:   Hospital course: Michael Shellony E Locker is a 60 y.o. male who presents to the Emergency Department complaining of improving right upper extremity weakness that began this morning about 3 hours ago. He states he lost his balance after getting out of bed causing him to fall against the wall a couple of times about 5 hours ago upon waking. Pt states he was here yesterday for a HA, nausea and vertigo and received a negative CT scan. He states it was suggested that he be admitted yesterday but pt states he could not stay because he had to go home to take care of his wife. He states he took Ibuprofen PTA for the HA with significant relief. He denies alleviating factors. He denies numbness, LOC.   Assessment & Plan:   Acute Ischemic Stroke - continue stroke work up in progress.  Appreciate neurology stroke team assistance.   Hyperlipidemia - Pt started on atorvastatin 40 mg daily.  Essential hypertension - blood pressures have been fairly well controlled on no meds, following.  Tobacco Abuse - counseled on tobacco cessation.  Mild creatinine elevation - repeat in AM. Cont IVF.  Subjective: Pt without complaints.   Objective: Filed Vitals:   02/19/16 0008 02/19/16 0201 02/19/16 0517 02/19/16 0921  BP: 147/112 133/80 147/81 134/74  Pulse: 69 63 76 69  Temp: 98.1 F (36.7 C) 98.2 F (36.8 C) 98.6 F (37 C) 99 F (37.2 C)  TempSrc: Oral Oral Oral Oral  Resp: 16 15 16 16   Weight:      SpO2: 98% 96% 99% 97%    Intake/Output Summary (Last 24 hours) at 02/19/16 1044 Last data filed at 02/18/16 1740  Gross per 24 hour  Intake    240 ml  Output      0 ml  Net    240 ml   Filed Weights   02/18/16 1039  Weight: 194 lb (87.998 kg)    Exam:  General exam: awake, alert, no distress, cooperative Respiratory system: Clear. No increased work of  breathing. Cardiovascular system: S1 & S2 heard, RRR. No JVD, murmurs, gallops, clicks or pedal edema. Gastrointestinal system: Abdomen is nondistended, soft and nontender. Normal bowel sounds heard. Central nervous system: Alert and oriented. Mild facial droop. Extremities: no edema.   Data Reviewed: Basic Metabolic Panel:  Recent Labs Lab 02/17/16 1507 02/18/16 1118 02/18/16 1655  NA 136 137  --   K 3.7 3.6  --   CL 104 107  --   CO2 20* 24  --   GLUCOSE 117* 94  --   BUN 17 12  --   CREATININE 1.19 1.23 1.25*  CALCIUM 9.0 8.8*  --    Liver Function Tests:  Recent Labs Lab 02/17/16 1507  AST 18  ALT 16*  ALKPHOS 71  BILITOT 0.5  PROT 6.7  ALBUMIN 4.3   No results for input(s): LIPASE, AMYLASE in the last 168 hours. No results for input(s): AMMONIA in the last 168 hours. CBC:  Recent Labs Lab 02/17/16 1507 02/18/16 1118 02/18/16 1655  WBC 10.1 8.0 6.6  NEUTROABS 7.6 5.3  --   HGB 14.8 14.6 14.1  HCT 41.8 41.0 40.7  MCV 94.8 95.3 96.4  PLT 198 222 210   Cardiac Enzymes:  Recent Labs Lab 02/17/16 1507  TROPONINI <0.03   BNP (last 3 results) No results for input(s):  PROBNP in the last 8760 hours. CBG: No results for input(s): GLUCAP in the last 168 hours.  No results found for this or any previous visit (from the past 240 hour(s)).   Studies: Dg Chest 2 View  02/17/2016  CLINICAL DATA:  Generalized weakness for 4 days EXAM: CHEST  2 VIEW COMPARISON:  March 26, 2012 chest radiograph and chest CT FINDINGS: There is no edema or consolidation. The heart size and pulmonary vascularity are normal. No adenopathy. There is degenerative change in the thoracic spine. IMPRESSION: No edema or consolidation. Electronically Signed   By: Bretta Bang III M.D.   On: 02/17/2016 16:28   Ct Head Wo Contrast  02/18/2016  ADDENDUM REPORT: 02/18/2016 16:55 ADDENDUM: Study discussed by telephone with neurology Dr. Roxy Manns on 02/18/2016 at 16:54 . There is abnormal  hypodensity in the right cerebellum (series 2, image 5). This is seen on MRI today to reflect acute right PICA infarct (please see that report for details). Electronically Signed   By: Odessa Fleming M.D.   On: 02/18/2016 16:55  02/18/2016  CLINICAL DATA:  Left hand tingling and weakness, initial encounter EXAM: CT HEAD WITHOUT CONTRAST TECHNIQUE: Contiguous axial images were obtained from the base of the skull through the vertex without intravenous contrast. COMPARISON:  None FINDINGS: Bony calvarium is intact. No gross soft tissue abnormality is noted. Few scattered areas of decreased attenuation are noted. One within the left frontal parietal region an a second within the right cerebellar hemisphere laterally. These are consistent with areas of prior ischemia. No findings to suggest acute hemorrhage, acute infarction or space-occupying mass lesion are noted. IMPRESSION: Chronic ischemic changes without acute abnormality. Electronically Signed: By: Alcide Clever M.D. On: 02/17/2016 16:54   Mr Angiogram Neck W Wo Contrast  02/18/2016  CLINICAL DATA:  Vertigo, slurred speech, RIGHT upper extremity weakness for 1 week. Evaluate RIGHT cerebral artery occlusion. History of hyperlipidemia and tobacco use. EXAM: MRA NECK WITHOUT AND WITH CONTRAST TECHNIQUE: Multiplanar and multiecho pulse sequences of the neck were obtained without and with intravenous contrast. Angiographic images of the neck were obtained using MRA technique without and with intravenous contrast. CONTRAST:  15 cc MultiHance COMPARISON:  MRA head February 18, 2016 at 1535 hours FINDINGS: Source images and MIP image were reviewed. The common carotid arteries are widely patent bilaterally. The carotid bifurcation is patent bilaterally and there is no significant carotid stenosis. Both vertebral arteries and patent to the basilar without significant vertebral stenosis. There is no evidence for atherosclerosis or flow limiting stenosis. LEFT vertebral artery is  dominant. Normal contrast opacification bilateral vertebral arteries to the distal V2 segment with there is gradual loss of contrast opacification and, complete loss of contrast opacification RIGHT V3 segment without distal reconstitution. 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 No distal reconstitution. Electronically Signed   By: Awilda Metro M.D.   On: 02/18/2016 23:26   Mr Brain Wo Contrast  02/18/2016  CLINICAL DATA:  60 year old male presenting initially with vertigo, but now with right upper extremity weakness and slurred speech. Initial encounter. EXAM: MRI HEAD WITHOUT CONTRAST MRA HEAD WITHOUT CONTRAST TECHNIQUE: Multiplanar, multiecho pulse sequences of the brain and surrounding structures were obtained without intravenous contrast. Angiographic images of the head were obtained using MRA technique without contrast. COMPARISON:  Head CT without contrast 02/17/2016. FINDINGS: MRI HEAD FINDINGS Confluent 2-3 cm area of restricted diffusion in the lateral right cerebellum. There is an associated small focus of restricted diffusion in  the right lateral medulla (series 5, image 7). No other posterior fossa restricted diffusion, however, there are 2 small areas of diffusion restriction in the left occipital lobe (series 5, image 23). No anterior circulation diffusion abnormality. Major intracranial vascular flow voids are preserved except for the right vertebral artery V4 segment, see below. T2 and FLAIR hyperintensity associated with the acute infarcts. No associated hemorrhage or mass effect. Elsewhere minimal to mild for age nonspecific cerebral white matter T2 and FLAIR hyperintensity. No cortical encephalomalacia identified. Visible internal auditory structures appear normal. Mastoids are clear. Paranasal sinuses are well pneumatized. Negative orbit and scalp soft tissues. Negative visualized cervical spine. Normal bone marrow signal. No midline shift,  mass effect, evidence of mass lesion, ventriculomegaly, extra-axial collection or acute intracranial hemorrhage. Cervicomedullary junction and pituitary are within normal limits. MRA HEAD FINDINGS Absent flow signal in the distal right vertebral artery except for just proximal to the right vertebrobasilar junction. Preserved flow signal in the distal left vertebral artery and at the left PICA origin. Right AICA origin is patent. No right PICA flow is identified. No basilar stenosis. Normal SCA and PCA origins. Small right posterior communicating artery, the left is more diminutive or absent. Normal bilateral PCA branches. Antegrade flow in both ICA siphons. No siphon stenosis. Normal ophthalmic and right posterior communicating artery origins. Normal carotid termini, MCA and ACA origins. Dominant right A1 segment. Anterior communicating artery and visualized bilateral ACA branches are within normal limits. MCA M1 segments, MCA bifurcations, and visualized bilateral MCA branches are within normal limits. IMPRESSION: 1. Positive for emergent large vessel occlusion: Right vertebral artery, with associated acute right PICA infarcts (right lateral medulla and right cerebellum). No associated hemorrhage or mass effect. 2. Small associated acute infarcts in the left occipital lobe. Study discussed by telephone with neurology Dr. Roxy Manns On 02/18/2016 at 1650 hours. Electronically Signed   By: Odessa Fleming M.D.   On: 02/18/2016 16:53   Mr Maxine Glenn Head/brain Wo Cm  02/18/2016  CLINICAL DATA:  60 year old male presenting initially with vertigo, but now with right upper extremity weakness and slurred speech. Initial encounter. EXAM: MRI HEAD WITHOUT CONTRAST MRA HEAD WITHOUT CONTRAST TECHNIQUE: Multiplanar, multiecho pulse sequences of the brain and surrounding structures were obtained without intravenous contrast. Angiographic images of the head were obtained using MRA technique without contrast. COMPARISON:  Head CT without contrast  02/17/2016. FINDINGS: MRI HEAD FINDINGS Confluent 2-3 cm area of restricted diffusion in the lateral right cerebellum. There is an associated small focus of restricted diffusion in the right lateral medulla (series 5, image 7). No other posterior fossa restricted diffusion, however, there are 2 small areas of diffusion restriction in the left occipital lobe (series 5, image 23). No anterior circulation diffusion abnormality. Major intracranial vascular flow voids are preserved except for the right vertebral artery V4 segment, see below. T2 and FLAIR hyperintensity associated with the acute infarcts. No associated hemorrhage or mass effect. Elsewhere minimal to mild for age nonspecific cerebral white matter T2 and FLAIR hyperintensity. No cortical encephalomalacia identified. Visible internal auditory structures appear normal. Mastoids are clear. Paranasal sinuses are well pneumatized. Negative orbit and scalp soft tissues. Negative visualized cervical spine. Normal bone marrow signal. No midline shift, mass effect, evidence of mass lesion, ventriculomegaly, extra-axial collection or acute intracranial hemorrhage. Cervicomedullary junction and pituitary are within normal limits. MRA HEAD FINDINGS Absent flow signal in the distal right vertebral artery except for just proximal to the right vertebrobasilar junction. Preserved flow signal in the distal left  vertebral artery and at the left PICA origin. Right AICA origin is patent. No right PICA flow is identified. No basilar stenosis. Normal SCA and PCA origins. Small right posterior communicating artery, the left is more diminutive or absent. Normal bilateral PCA branches. Antegrade flow in both ICA siphons. No siphon stenosis. Normal ophthalmic and right posterior communicating artery origins. Normal carotid termini, MCA and ACA origins. Dominant right A1 segment. Anterior communicating artery and visualized bilateral ACA branches are within normal limits. MCA M1  segments, MCA bifurcations, and visualized bilateral MCA branches are within normal limits. IMPRESSION: 1. Positive for emergent large vessel occlusion: Right vertebral artery, with associated acute right PICA infarcts (right lateral medulla and right cerebellum). No associated hemorrhage or mass effect. 2. Small associated acute infarcts in the left occipital lobe. Study discussed by telephone with neurology Dr. Roxy Manns On 02/18/2016 at 1650 hours. Electronically Signed   By: Odessa Fleming M.D.   On: 02/18/2016 16:53   Scheduled Meds: . aspirin  300 mg Rectal Daily   Or  . aspirin  325 mg Oral Daily  . atorvastatin  40 mg Oral q1800  . heparin  5,000 Units Subcutaneous Q8H   Continuous Infusions: . sodium chloride 100 mL/hr at 02/18/16 1630   Principal Problem:   Acute CVA (cerebrovascular accident) (HCC) Active Problems:   Tobacco abuse   Hyperlipidemia   Right arm weakness  Time spent:   Standley Dakins, MD, FAAFP [facility] Pager [phone]  If 7PM-7AM, please contact night-coverage [email or web address] Password [email or web address] 02/19/2016, 10:44 AM    LOS: 1 day

## 2016-02-19 NOTE — Discharge Summary (Signed)
Physician Discharge Summary  Michael Shellony E Gatley WUJ:[id number] DOB: 1956/05/06 DOA: 02/18/2016  PCP: No PCP Per Patient  Admit date: 02/18/2016 Discharge date: 02/19/2016  Admitted From: home Disposition:  Home  Recommendations for Outpatient Follow-up:  1. Follow up with [facility] in 2 weeks 2. Establish care and [facility] in 1 week 3. Neurology to arrange 30 day event Holter Monitor 4. Follow up on echocardiogram and other hospital testing, labs, etc.   Discharge Condition: Stable CODE STATUS: Full Diet recommendation: Heart Healthy  Brief/Interim Summary: Michael Cortez is a [age] y.o. male who presents to the Emergency Department complaining of improving right upper extremity weakness that began this morning about 3 hours ago. He states he lost his balance after getting out of bed causing him to fall against the wall a couple of times about 5 hours ago upon waking. Pt states he was here yesterday for a HA, nausea and vertigo and received a negative CT scan. He states it was suggested that he be admitted yesterday but pt states he could not stay because he had to go home to take care of his wife. He states he took Ibuprofen PTA for the HA with significant relief. He denies alleviating factors. He denies numbness, LOC.   Assessment & Plan:  Acute Ischemic Stroke - continue stroke work up in progress. Appreciate neurology stroke team assistance.  Pt will need a 30 day event monitor arranged thru neurology.  Pt will be discharged on plavix, atorvastatin per neurology with close neurology follow up.  Pt refused to stay in hospital for full stroke workup says he must care for his sick wife at home.  Risks explained to patient who verbalized understanding.  Echo was done prior to discharge.  Pt agreed to follow up with neurologist outpatient for results.  PT evaluated and said no further needs identified. Pt advised to return to ED for any acute symptoms.    Hyperlipidemia - Pt started on atorvastatin 40 mg daily.   Essential hypertension - blood pressures have been fairly well controlled on no meds, following.   Tobacco Abuse - counseled on tobacco cessation. Written information provided. Ready to quit: Not Answered Counseling given: Yes.   Mild creatinine elevation - repeat outpatient. Hold NSAIDS.  Discharge Diagnoses:  Principal Problem:   Acute CVA (cerebrovascular accident) (HCC) Active Problems:   Tobacco abuse   Hyperlipidemia   Right arm weakness   Occlusion and stenosis of vertebral artery  Discharge Instructions  Discharge Instructions    Diet - low sodium heart healthy    Complete by:  As directed      Increase activity slowly    Complete by:  As directed             Medication List    STOP taking these medications        aspirin 81 MG chewable tablet     ibuprofen 600 MG tablet  Commonly known as:  ADVIL,MOTRIN      TAKE these medications        atorvastatin 40 MG tablet  Commonly known as:  LIPITOR  Take 1 tablet (40 mg total) by mouth daily at 6 PM.     clopidogrel 75 MG tablet  Commonly known as:  PLAVIX  Take 1 tablet (75 mg total) by mouth daily.           Follow-up Information    Follow up with [facility]. Schedule an appointment as soon  as possible for a visit in 2 weeks.   Why:  Hospital Follow Up   Contact information:   [address]     [address] [place] [address] [phone]      Follow up with [facility]. Schedule an appointment as soon as possible for a visit in 1 week.   Why:  Hospital Follow Up Stroke - establish primary care   Contact information:   [address] [place] [address] [phone]     No Known Allergies  Consultations:  Neurology stroke team   Procedures/Studies: Dg Chest 2 View  02/17/2016  CLINICAL DATA:  Generalized weakness for 4 days EXAM:  CHEST  2 VIEW COMPARISON:  March 26, 2012 chest radiograph and chest CT FINDINGS: There is no edema or consolidation. The heart size and pulmonary vascularity are normal. No adenopathy. There is degenerative change in the thoracic spine. IMPRESSION: No edema or consolidation. Electronically Signed   By: Bretta Bang III M.D.   On: 02/17/2016 16:28   Ct Head Wo Contrast  02/18/2016  ADDENDUM REPORT: 02/18/2016 16:55 ADDENDUM: Study discussed by telephone with neurology Dr. Roxy Manns on 02/18/2016 at 16:54 . There is abnormal hypodensity in the right cerebellum (series 2, image 5). This is seen on MRI today to reflect acute right PICA infarct (please see that report for details). Electronically Signed   By: Odessa Fleming M.D.   On: 02/18/2016 16:55  02/18/2016  CLINICAL DATA:  Left hand tingling and weakness, initial encounter EXAM: CT HEAD WITHOUT CONTRAST TECHNIQUE: Contiguous axial images were obtained from the base of the skull through the vertex without intravenous contrast. COMPARISON:  None FINDINGS: Bony calvarium is intact. No gross soft tissue abnormality is noted. Few scattered areas of decreased attenuation are noted. One within the left frontal parietal region an a second within the right cerebellar hemisphere laterally. These are consistent with areas of prior ischemia. No findings to suggest acute hemorrhage, acute infarction or space-occupying mass lesion are noted. IMPRESSION: Chronic ischemic changes without acute abnormality. Electronically Signed: By: Alcide Clever M.D. On: 02/17/2016 16:54   Mr Angiogram Neck W Wo Contrast  02/18/2016  CLINICAL DATA:  Vertigo, slurred speech, RIGHT upper extremity weakness for 1 week. Evaluate RIGHT cerebral artery occlusion. History of hyperlipidemia and tobacco use. EXAM: MRA NECK WITHOUT AND WITH CONTRAST TECHNIQUE: Multiplanar and multiecho pulse sequences of the neck were obtained without and with intravenous contrast. Angiographic images of the neck were  obtained using MRA technique without and with intravenous contrast. CONTRAST:  15 cc MultiHance COMPARISON:  MRA head February 18, 2016 at 1535 hours FINDINGS: Source images and MIP image were reviewed. The common carotid arteries are widely patent bilaterally. The carotid bifurcation is patent bilaterally and there is no significant carotid stenosis. Both vertebral arteries and patent to the basilar without significant vertebral stenosis. There is no evidence for atherosclerosis or flow limiting stenosis. LEFT vertebral artery is dominant. Normal contrast opacification bilateral vertebral arteries to the distal V2 segment with there is gradual loss of contrast opacification and, complete loss of contrast opacification RIGHT V3 segment without distal reconstitution. 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 No distal reconstitution. Electronically Signed   By: Awilda Metro M.D.   On: 02/18/2016 23:26   Mr Brain Wo Contrast  02/18/2016  CLINICAL DATA:  60 year old male presenting initially with vertigo, but now with right upper extremity weakness and slurred speech. Initial encounter.  EXAM: MRI HEAD WITHOUT CONTRAST MRA HEAD WITHOUT CONTRAST TECHNIQUE: Multiplanar, multiecho pulse sequences of the brain and surrounding structures were obtained without intravenous contrast. Angiographic images of the head were obtained using MRA technique without contrast. COMPARISON:  Head CT without contrast 02/17/2016. FINDINGS: MRI HEAD FINDINGS Confluent 2-3 cm area of restricted diffusion in the lateral right cerebellum. There is an associated small focus of restricted diffusion in the right lateral medulla (series 5, image 7). No other posterior fossa restricted diffusion, however, there are 2 small areas of diffusion restriction in the left occipital lobe (series 5, image 23). No anterior circulation diffusion abnormality. Major intracranial vascular flow voids are preserved except  for the right vertebral artery V4 segment, see below. T2 and FLAIR hyperintensity associated with the acute infarcts. No associated hemorrhage or mass effect. Elsewhere minimal to mild for age nonspecific cerebral white matter T2 and FLAIR hyperintensity. No cortical encephalomalacia identified. Visible internal auditory structures appear normal. Mastoids are clear. Paranasal sinuses are well pneumatized. Negative orbit and scalp soft tissues. Negative visualized cervical spine. Normal bone marrow signal. No midline shift, mass effect, evidence of mass lesion, ventriculomegaly, extra-axial collection or acute intracranial hemorrhage. Cervicomedullary junction and pituitary are within normal limits. MRA HEAD FINDINGS Absent flow signal in the distal right vertebral artery except for just proximal to the right vertebrobasilar junction. Preserved flow signal in the distal left vertebral artery and at the left PICA origin. Right AICA origin is patent. No right PICA flow is identified. No basilar stenosis. Normal SCA and PCA origins. Small right posterior communicating artery, the left is more diminutive or absent. Normal bilateral PCA branches. Antegrade flow in both ICA siphons. No siphon stenosis. Normal ophthalmic and right posterior communicating artery origins. Normal carotid termini, MCA and ACA origins. Dominant right A1 segment. Anterior communicating artery and visualized bilateral ACA branches are within normal limits. MCA M1 segments, MCA bifurcations, and visualized bilateral MCA branches are within normal limits. IMPRESSION: 1. Positive for emergent large vessel occlusion: Right vertebral artery, with associated acute right PICA infarcts (right lateral medulla and right cerebellum). No associated hemorrhage or mass effect. 2. Small associated acute infarcts in the left occipital lobe. Study discussed by telephone with neurology Dr. Roxy Manns On 02/18/2016 at 1650 hours. Electronically Signed   By: Odessa Fleming M.D.    On: 02/18/2016 16:53   Mr Maxine Glenn Head/brain Wo Cm  02/18/2016  CLINICAL DATA:  60 year old male presenting initially with vertigo, but now with right upper extremity weakness and slurred speech. Initial encounter. EXAM: MRI HEAD WITHOUT CONTRAST MRA HEAD WITHOUT CONTRAST TECHNIQUE: Multiplanar, multiecho pulse sequences of the brain and surrounding structures were obtained without intravenous contrast. Angiographic images of the head were obtained using MRA technique without contrast. COMPARISON:  Head CT without contrast 02/17/2016. FINDINGS: MRI HEAD FINDINGS Confluent 2-3 cm area of restricted diffusion in the lateral right cerebellum. There is an associated small focus of restricted diffusion in the right lateral medulla (series 5, image 7). No other posterior fossa restricted diffusion, however, there are 2 small areas of diffusion restriction in the left occipital lobe (series 5, image 23). No anterior circulation diffusion abnormality. Major intracranial vascular flow voids are preserved except for the right vertebral artery V4 segment, see below. T2 and FLAIR hyperintensity associated with the acute infarcts. No associated hemorrhage or mass effect. Elsewhere minimal to mild for age nonspecific cerebral white matter T2 and FLAIR hyperintensity. No cortical encephalomalacia identified. Visible internal auditory structures appear normal. Mastoids are clear. Paranasal  sinuses are well pneumatized. Negative orbit and scalp soft tissues. Negative visualized cervical spine. Normal bone marrow signal. No midline shift, mass effect, evidence of mass lesion, ventriculomegaly, extra-axial collection or acute intracranial hemorrhage. Cervicomedullary junction and pituitary are within normal limits. MRA HEAD FINDINGS Absent flow signal in the distal right vertebral artery except for just proximal to the right vertebrobasilar junction. Preserved flow signal in the distal left vertebral artery and at the left PICA origin.  Right AICA origin is patent. No right PICA flow is identified. No basilar stenosis. Normal SCA and PCA origins. Small right posterior communicating artery, the left is more diminutive or absent. Normal bilateral PCA branches. Antegrade flow in both ICA siphons. No siphon stenosis. Normal ophthalmic and right posterior communicating artery origins. Normal carotid termini, MCA and ACA origins. Dominant right A1 segment. Anterior communicating artery and visualized bilateral ACA branches are within normal limits. MCA M1 segments, MCA bifurcations, and visualized bilateral MCA branches are within normal limits. IMPRESSION: 1. Positive for emergent large vessel occlusion: Right vertebral artery, with associated acute right PICA infarcts (right lateral medulla and right cerebellum). No associated hemorrhage or mass effect. 2. Small associated acute infarcts in the left occipital lobe. Study discussed by telephone with neurology Dr. Roxy Manns On 02/18/2016 at 1650 hours. Electronically Signed   By: Odessa Fleming M.D.   On: 02/18/2016 16:53    (Echo, Carotid, EGD, Colonoscopy, ERCP)    Subjective: Pt adamant that he will leave today...will not stay for full workup.  Says he will follow up outpatient.  PT says he had no further needs.   Discharge Exam: Filed Vitals:   02/19/16 0921 02/19/16 1331  BP: 134/74 130/72  Pulse: 69 72  Temp: 99 F (37.2 C) 97.6 F (36.4 C)  Resp: 16 18   Filed Vitals:   02/19/16 0201 02/19/16 0517 02/19/16 0921 02/19/16 1331  BP: 133/80 147/81 134/74 130/72  Pulse: 63 76 69 72  Temp: 98.2 F (36.8 C) 98.6 F (37 C) 99 F (37.2 C) 97.6 F (36.4 C)  TempSrc: Oral Oral Oral Oral  Resp: 15 16 16 18   Weight:      SpO2: 96% 99% 97% 100%    General exam: awake, alert, no distress, cooperative Respiratory system: Clear. No increased work of breathing. Cardiovascular system: S1 & S2 heard, RRR. No JVD, murmurs, gallops, clicks or pedal edema. Gastrointestinal system: Abdomen is  nondistended, soft and nontender. Normal bowel sounds heard. Central nervous system: Alert and oriented. Mild facial droop. Extremities: no edema.    The results of significant diagnostics from this hospitalization (including imaging, microbiology, ancillary and laboratory) are listed below for reference.     Microbiology: No results found for this or any previous visit (from the past 240 hour(s)).   Labs: BNP (last 3 results) No results for input(s): BNP in the last 8760 hours. Basic Metabolic Panel:  Recent Labs Lab 02/17/16 1507 02/18/16 1118 02/18/16 1655  NA 136 137  --   K 3.7 3.6  --   CL 104 107  --   CO2 20* 24  --   GLUCOSE 117* 94  --   BUN 17 12  --   CREATININE 1.19 1.23 1.25*  CALCIUM 9.0 8.8*  --    Liver Function Tests:  Recent Labs Lab 02/17/16 1507  AST 18  ALT 16*  ALKPHOS 71  BILITOT 0.5  PROT 6.7  ALBUMIN 4.3   No results for input(s): LIPASE, AMYLASE in the last 168 hours.  No results for input(s): AMMONIA in the last 168 hours. CBC:  Recent Labs Lab 02/17/16 1507 02/18/16 1118 02/18/16 1655  WBC 10.1 8.0 6.6  NEUTROABS 7.6 5.3  --   HGB 14.8 14.6 14.1  HCT 41.8 41.0 40.7  MCV 94.8 95.3 96.4  PLT 198 222 210   Cardiac Enzymes:  Recent Labs Lab 02/17/16 1507  TROPONINI <0.03   BNP: Invalid input(s): POCBNP CBG: No results for input(s): GLUCAP in the last 168 hours. D-Dimer No results for input(s): DDIMER in the last 72 hours. Hgb A1c No results for input(s): HGBA1C in the last 72 hours. Lipid Profile  Recent Labs  02/19/16 0811  CHOL 196  HDL 47  LDLCALC 129*  TRIG 100  CHOLHDL 4.2   Thyroid function studies No results for input(s): TSH, T4TOTAL, T3FREE, THYROIDAB in the last 72 hours.  Invalid input(s): FREET3 Anemia work up No results for input(s): VITAMINB12, FOLATE, FERRITIN, TIBC, IRON, RETICCTPCT in the last 72 hours. Urinalysis    Component Value Date/Time   COLORURINE YELLOW 02/17/2016 1720    APPEARANCEUR CLEAR 02/17/2016 1720   LABSPEC 1.015 02/17/2016 1720   PHURINE 6.5 02/17/2016 1720   GLUCOSEU NEGATIVE 02/17/2016 1720   HGBUR SMALL* 02/17/2016 1720   BILIRUBINUR NEGATIVE 02/17/2016 1720   KETONESUR NEGATIVE 02/17/2016 1720   PROTEINUR NEGATIVE 02/17/2016 1720   NITRITE NEGATIVE 02/17/2016 1720   LEUKOCYTESUR NEGATIVE 02/17/2016 1720   Sepsis Labs Invalid input(s): PROCALCITONIN,  WBC,  LACTICIDVEN Microbiology No results found for this or any previous visit (from the past 240 hour(s)).   Time coordinating discharge: 27 mins  SIGNED:  Standley Dakins, MD  [facility] 02/19/2016, 2:42 PM Pager   If 7PM-7AM, please contact night-coverage [email or web address] Password [email or web address]

## 2016-02-19 NOTE — Progress Notes (Addendum)
Pt discharged home with ride, by car, assessment stable, prescriptions discussed and have been faxed to pharmacy, discharge instructions reviewed, all questions answered. Rn highlighted the importance of following up with neurology and working with their billing department (pt stated he does not have insurance). rn discussed smoking cessation, pt does not want to quit cold [place] but is willing to cut back. Said he has cut back from 3 cartons/ month, to 2 cartons/month, and is going to work on cutting back to 1 carton/ month.  IV removed. Tele discontinued. Pt at this time waiting for ride.   Pt taken by wheelchair to exit. Time of discharge: 1630

## 2016-02-19 NOTE — Progress Notes (Signed)
CM spoke to the patient at the bedside who advised that he can't afford his Plavix and atorvastatin. CM offered to print the goodRX coupon totaling $30 for both medications and patient said that he could not pay for that until Friday. CM explained MATCH program to patient and he was agreeable. CM printed MATCH letter and explained program further but patient left prior to getting his letter. CM called patient and he said that he forgot and left so [facility] and informed patient that the medications would be $3 each. Patient given information on [facility] along with sickle cell clinic. He said that he lived 45 min away but that he comes back this way often so being seen there would be no issue and declined further needs for CM.

## 2016-02-19 NOTE — Evaluation (Signed)
Physical Therapy Evaluation Patient Details Name: Michael Cortez MRN: [id number] DOB: [birth year] Today's Date: 02/19/2016   History of Present Illness    Michael Cortez is an 60 y.o. male with a history of hyperlipidemia and ongoing tobacco use who has been experiencing waxing and waning symptoms of non-positional vertigo, headache, slurred speech, and right upper extremity weakness for approximately one week. The patient was seen at the [facility] emergency department yesterday where a CT scan of the head was reportedly negative. The patient declined admission as he cares for his wife at home who has breast cancer and they have no medical insurance. He was started on aspirin 81 mg daily at that time. The patient initially felt better that morning but as the day progressed he once again had balance problems and difficulty walking. He went back to the emergency department, had a repeat CT scan of the head, and was transferred to [facility] for further evaluation and treatment. The patient feels he is almost back to baseline although he has not attempted ambulation recently.    Clinical Impression  Pt presents at/near his baseline functional status, is ambulatory with mild dizziness provoked with head turns to left.  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  Dynamic Gait Index test WNL (20/24) indicating low fall risk during functional activities.  Nursing alerted pt can be removed from chair alarm to move around room.  No further PT needs at this time.  Thank you.    Follow Up Recommendations No PT follow up    Equipment Recommendations  None recommended by PT    Recommendations for Other Services       Precautions / Restrictions Precautions Precautions: None      Mobility  Bed Mobility Overal  bed mobility: Independent                Transfers Overall transfer level: Independent Equipment used: None             General transfer comment: pt uses armrest out of abundance of caution  Ambulation/Gait Ambulation/Gait assistance: Supervision Ambulation Distance (Feet): 250 Feet Assistive device: None Gait Pattern/deviations: Step-through pattern;Wide base of support   Gait velocity interpretation: at or above normal speed for age/gender General Gait Details: see DGI; pt demonstrates WBOS likely due to fear of imbalance though denies disequillibrium except with L head turns; instructed on X1 viewing in sitting, transferred to standing, walking with no incr dizziness or imbalance noted  Stairs            Wheelchair Mobility    Modified Rankin (Stroke Patients Only) Modified Rankin (Stroke Patients Only) Pre-Morbid Rankin Score: No symptoms Modified Rankin: No significant disability     Balance Overall balance assessment: Needs assistance Sitting-balance support: Feet supported;No upper extremity supported Sitting balance-Leahy Scale: Normal Sitting balance - Comments: leans forward, reaches upward with no symptoms   Standing balance support: No upper extremity supported;During functional activity Standing balance-Leahy Scale: Good Standing balance comment: except for first trial walking with head turns (to LEFT), asymptomatic for static and dynamic (SEE DGI)             High level balance activites: Head turns;Direction changes;Turns;Sudden stops   Standardized Balance Assessment Standardized Balance Assessment : Dynamic Gait Index   Dynamic Gait Index Level Surface: Mild Impairment Change in Gait Speed: Normal Gait with Horizontal  Head Turns: Moderate Impairment Gait with Vertical Head Turns: Normal Gait and Pivot Turn: Normal Step Over Obstacle: Normal Step Around Obstacles: Normal Steps: Mild Impairment Total Score: 20        Pertinent Vitals/Pain Pain Assessment: No/denies pain    Home Living Family/patient expects to be discharged to:: Private residence Living Arrangements: Spouse/significant other Available Help at Discharge: Family;Available PRN/intermittently (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) Type of Home: House Home Access: Level entry     Home Layout: One level Home Equipment: None      Prior Function Level of Independence: Independent               Hand Dominance        Extremity/Trunk Assessment   Upper Extremity Assessment: Overall WFL for tasks assessed           Lower Extremity Assessment: Overall WFL for tasks assessed      Cervical / Trunk Assessment: Normal  Communication   Communication: No difficulties  Cognition Arousal/Alertness: Awake/alert Behavior During Therapy: WFL for tasks assessed/performed Overall Cognitive Status: Within Functional Limits for tasks assessed                      General Comments General comments (skin integrity, edema, etc.): Pt instructed in X1 viewing in sitting/standing/walking, provided all safety instructions for fall prevention, and educated on progressing -- pt has no insurance and cannot pay for OPPT or other rehab services    Exercises        Assessment/Plan    PT Assessment Patent does not need any further PT services  PT Diagnosis Hemiplegia dominant side   PT Problem List    PT Treatment Interventions     PT Goals (Current goals can be found in the Care Plan section) Acute Rehab PT Goals Patient Stated Goal: go home today, resume care for wife PT Goal Formulation: All assessment and education complete, DC therapy    Frequency     Barriers to discharge        Co-evaluation               End of Session Equipment Utilized During Treatment: Gait belt Activity Tolerance: Patient tolerated treatment well Patient left: in chair;with chair alarm set;with call bell/phone within  reach Nurse Communication: Mobility status;Precautions (ok to remove chair alarm, pt is able to be Indep )    Functional Assessment Tool Used: DGI Functional Limitation: Mobility: Walking and moving around Mobility: Walking and Moving Around Current Status (W0981(G8978): At least 1 percent but less than 20 percent impaired, limited or restricted Mobility: Walking and Moving Around Goal Status [phone](G8979): At least 1 percent but less than 20 percent impaired, limited or restricted Mobility: Walking and Moving Around Discharge Status [phone]([id number]): At least 1 percent but less than 20 percent impaired, limited or restricted    Time: 1050-1125 PT Time Calculation (min) (ACUTE ONLY): 35 min   Charges:   PT Evaluation $PT Eval Moderate Complexity: 1 Procedure PT Treatments $Physical Performance Test: 8-22 mins   PT G Codes:   PT G-Codes **NOT FOR INPATIENT CLASS** Functional Assessment Tool Used: DGI Functional Limitation: Mobility: Walking and moving around Mobility: Walking and Moving Around Current Status ([id number](G8978): At least 1 percent but less than 20 percent impaired, limited or restricted Mobility: Walking and Moving Around Goal Status [phone]([id number]): At least 1 percent but less than 20 percent impaired, limited or restricted Mobility: Walking and Moving Around Discharge Status [phone](G8980): At least 1  percent but less than 20 percent impaired, limited or restricted    Dennis Bast 02/19/2016, 11:33 AM

## 2016-02-20 LAB — HEMOGLOBIN A1C
Hgb A1c MFr Bld: 5.2 % (ref 4.8–5.6)
Mean Plasma Glucose: 103 mg/dL

## 2016-03-12 ENCOUNTER — Other Ambulatory Visit: Payer: Self-pay

## 2016-03-12 DIAGNOSIS — I639 Cerebral infarction, unspecified: Secondary | ICD-10-CM

## 2016-03-15 ENCOUNTER — Ambulatory Visit (INDEPENDENT_AMBULATORY_CARE_PROVIDER_SITE_OTHER): Payer: Self-pay

## 2016-03-15 DIAGNOSIS — I639 Cerebral infarction, unspecified: Secondary | ICD-10-CM

## 2016-03-23 ENCOUNTER — Ambulatory Visit: Payer: Self-pay | Admitting: Neurology

## 2016-04-10 NOTE — Congregational Nurse Program (Signed)
Congregational Nurse Program Note  Date of Encounter: 01/17/2016  Past Medical History: Past Medical History:  Diagnosis Date  . Anxiety and depression   . Chest pain 2005   03/2012: neg nuclear stress.  2005- Cardiac cath:20-40% lesions in the LAD, circumflex and RCA with normal EF  . Degenerative joint disease   . Hyperlipidemia   . Tobacco abuse    40 pack year total consumption    Encounter Details:  Client was [facility] with [facility] to establish primary provider. Client was offered assisted with appointment [facility], but declined, will seek assistance elsewhere. Appointment scheduled 02/01/16. Client enformed of Dental Clinic at [facility] in July.  Hewitt ShortsJan Janthony Holleman, RN CN [phone]

## 2016-04-12 NOTE — Congregational Nurse Program (Unsigned)
Congregational Nurse Program Note  Date of Encounter: 01/18/2016  Past Medical History: Past Medical History:  Diagnosis Date  . Anxiety and depression   . Chest pain 2005   03/2012: neg nuclear stress.  2005- Cardiac cath:20-40% lesions in the LAD, circumflex and RCA with normal EF  . Degenerative joint disease   . Hyperlipidemia   . Tobacco abuse    40 pack year total consumption    Encounter Details:   Client was [facility] to establish a primary provider. Client has history of heart disease. Client also has dental issues and offered assistance at dental clinic at health department. Informed of [facility] July at [facility]. Pearletha AlfredJan Kelbie Moro,RN CNP [phone]

## 2016-10-11 ENCOUNTER — Other Ambulatory Visit (HOSPITAL_COMMUNITY): Payer: Self-pay | Admitting: Internal Medicine

## 2016-10-11 DIAGNOSIS — R1031 Right lower quadrant pain: Secondary | ICD-10-CM

## 2016-10-11 DIAGNOSIS — M545 Low back pain, unspecified: Secondary | ICD-10-CM

## 2016-10-11 DIAGNOSIS — R319 Hematuria, unspecified: Secondary | ICD-10-CM

## 2016-10-17 ENCOUNTER — Ambulatory Visit (HOSPITAL_COMMUNITY)
Admission: RE | Admit: 2016-10-17 | Discharge: 2016-10-17 | Disposition: A | Discharge status: Home / Self Care | Payer: No Typology Code available for payment source | Source: Ambulatory Visit | Attending: Internal Medicine | Admitting: Internal Medicine

## 2016-10-17 DIAGNOSIS — R319 Hematuria, unspecified: Secondary | ICD-10-CM | POA: Diagnosis not present

## 2016-10-17 DIAGNOSIS — I700 Atherosclerosis of aorta: Secondary | ICD-10-CM | POA: Diagnosis not present

## 2016-10-17 DIAGNOSIS — I2510 Atherosclerotic heart disease of native coronary artery without angina pectoris: Secondary | ICD-10-CM | POA: Diagnosis not present

## 2016-10-17 DIAGNOSIS — R1031 Right lower quadrant pain: Secondary | ICD-10-CM | POA: Diagnosis not present

## 2016-10-17 DIAGNOSIS — N289 Disorder of kidney and ureter, unspecified: Secondary | ICD-10-CM | POA: Insufficient documentation

## 2016-10-17 DIAGNOSIS — M545 Low back pain, unspecified: Secondary | ICD-10-CM

## 2016-10-17 MED ORDER — IOPAMIDOL (ISOVUE-300) INJECTION 61%
125.0000 mL | Freq: Once | INTRAVENOUS | Status: AC | PRN
  Administered 2016-10-17: 125 mL via INTRAVENOUS

## 2017-08-21 ENCOUNTER — Encounter: Payer: Self-pay | Admitting: Gastroenterology

## 2017-09-12 ENCOUNTER — Ambulatory Visit: Payer: PRIVATE HEALTH INSURANCE

## 2017-10-14 ENCOUNTER — Telehealth: Payer: Self-pay | Admitting: General Practice

## 2017-10-14 ENCOUNTER — Ambulatory Visit (INDEPENDENT_AMBULATORY_CARE_PROVIDER_SITE_OTHER): Payer: Self-pay

## 2017-10-14 DIAGNOSIS — Z1211 Encounter for screening for malignant neoplasm of colon: Secondary | ICD-10-CM

## 2017-10-14 MED ORDER — PEG 3350-KCL-NA BICARB-NACL 420 G PO SOLR: 4000.0000 mL | ORAL | 0 refills | Status: DC

## 2017-10-14 NOTE — Patient Instructions (Addendum)
Michael Cortez   [birth year] MRN: [id number]    Procedure Date: 12/04/17 Time to register: 9:45 Place to register: [facility] Procedure Time: 10:45 Scheduled provider: [place] WITH [facility]-LYTE SPLIT PREP  Please notify us immediately if you are diabetic, take iron supplements, or if you are on Coumadin or any other blood thinners.     You will need to purchase 1 fleet enema and 1 box of Bisacodyl '5mg'$  tablets.   2 DAYS BEFORE PROCEDURE:  DATE: 12/02/17   DAY: Monday Begin clear liquid diet AFTER your lunch meal. NO SOLID FOODS after this point.  1 DAY BEFORE PROCEDURE:  DATE: 12/03/17   DAY: Tuesday Continue clear liquids the entire day - NO SOLID FOOD.     At 2:00 pm:  Take 2 Bisacodyl tablets.   At 4:00pm:  Start drinking your solution. Make sure you mix well per instructions on the bottle. Try to drink 1 (one) 8 ounce glass every 10-15 minutes until you have consumed HALF the jug. You should complete by 6:00pm.You must keep the left over solution refrigerated until completed next day.  Continue clear liquids. You must drink plenty of clear liquids to prevent dehyration and kidney failure. Nothing to eat or drink after midnight.  EXCEPTION: If you take medications for your heart, blood pressure or breathing, you may take these medications with a small amount of clear liquid.    DAY OF PROCEDURE:   DATE: 12/04/17   DAY: Wednesday    Five hours before your procedure time @ 5:45am:  Finish remaining amout of bowel prep, drinking 1 (one) 8 ounce glass every 10-15 minutes until complete. You have two hours to consume remaining prep.   Three hours before your procedure time '@7'$ :45am:  Nothing by mouth.   At least one hour before going to the hospital:  Give yourself one Fleet enema. You may take your morning medications with sip of water unless we have instructed otherwise.      Please see below for Dietary Information.  CLEAR LIQUIDS  INCLUDE:  Water Jello (NOT red in color)   Ice Popsicles (NOT red in color)   Tea (sugar ok, no milk/cream) Powdered fruit flavored drinks  Coffee (sugar ok, no milk/cream) Gatorade/ Lemonade/ Kool-Aid  (NOT red in color)   Juice: apple, white grape, white cranberry Soft drinks  Clear bullion, consomme, broth (fat free beef/chicken/vegetable)  Carbonated beverages (any kind)  Strained chicken noodle soup Hard Candy   Remember: Clear liquids are liquids that will allow you to see your fingers on the other side of a clear glass. Be sure liquids are NOT red in color, and not cloudy, but CLEAR.  DO NOT EAT OR DRINK ANY OF THE FOLLOWING:  Dairy products of any kind   Cranberry juice Tomato juice / V8 juice   Grapefruit juice Orange juice     Red grape juice  Do not eat any solid foods, including such foods as: cereal, oatmeal, yogurt, fruits, vegetables, creamed soups, eggs, bread, crackers, pureed foods in a blender, etc.   HELPFUL HINTS FOR DRINKING PREP SOLUTION:   Make sure prep is extremely cold. Mix and refrigerate the the morning of the prep. You may also put in the freezer.   You may try mixing some Crystal Light or Country Time Lemonade if you prefer. Mix in small amounts; add more if necessary.  Try drinking through a straw  Rinse mouth with water or a mouthwash between glasses, to  remove after-taste.  Try sipping on a cold beverage /ice/ popsicles between glasses of prep.  Place a piece of sugar-free hard candy in mouth between glasses.  If you become nauseated, try consuming smaller amounts, or stretch out the time between glasses. Stop for 30-60 minutes, then slowly start back drinking.        OTHER INSTRUCTIONS  You will need a responsible adult at least 62 years of age to accompany you and drive you home. This person must remain in the waiting room during your procedure. The hospital will cancel your procedure if you do not have a responsible adult with you.    1. Wear loose fitting clothing that is easily removed. 2. Leave jewelry and other valuables at home.  3. Remove all body piercing jewelry and leave at home. 4. Total time from sign-in until discharge is approximately 2-3 hours. 5. You should go home directly after your procedure and rest. You can resume normal activities the day after your procedure. 6. The day of your procedure you should not:  Drive  Make legal decisions  Operate machinery  Drink alcohol  Return to work   You may call the office (Dept: [phone]) before 5:00pm, or page the doctor on call [phone]) after 5:00pm, for further instructions, if necessary.   Insurance Information YOU WILL NEED TO CHECK WITH YOUR INSURANCE COMPANY FOR THE BENEFITS OF COVERAGE YOU HAVE FOR THIS PROCEDURE.  UNFORTUNATELY, NOT ALL INSURANCE COMPANIES HAVE BENEFITS TO COVER ALL OR PART OF THESE TYPES OF PROCEDURES.  IT IS YOUR RESPONSIBILITY TO CHECK YOUR BENEFITS, HOWEVER, WE WILL BE GLAD TO ASSIST YOU WITH ANY CODES YOUR INSURANCE COMPANY MAY NEED.    PLEASE NOTE THAT MOST INSURANCE COMPANIES WILL NOT COVER A SCREENING COLONOSCOPY FOR PEOPLE UNDER THE AGE OF 50  IF YOU HAVE BCBS INSURANCE, YOU MAY HAVE BENEFITS FOR A SCREENING COLONOSCOPY BUT IF POLYPS ARE FOUND THE DIAGNOSIS WILL CHANGE AND THEN YOU MAY HAVE A DEDUCTIBLE THAT WILL NEED TO BE MET. SO PLEASE MAKE SURE YOU CHECK YOUR BENEFITS FOR A SCREENING COLONOSCOPY AS WELL AS A DIAGNOSTIC COLONOSCOPY.

## 2017-10-14 NOTE — Progress Notes (Signed)
Gastroenterology Pre-Procedure Review  Request Date:10/14/17  requesting Physician: Dr.Gosrani  (no previous tcs)    The patient responded to the following health history questions as indicated:    1. Diabetes Melitis: no 2. Joint replacements in the past 12 months: no 3. Major health problems in the past 3 months: no 4. Has an artificial valve or MVP: no 5. Has a defibrillator: no 6. Has been advised in past to take antibiotics in advance of a procedure like teeth cleaning: no 7. Family history of colon cancer: yes ( brother (age 62))  8. Alcohol Use: no 9. History of sleep apnea: no  10. History of coronary artery or other vascular stents placed within the last 12 months: no 11. History of any prior anesthesia complications: no    MEDICATIONS & ALLERGIES:    Patient reports the following regarding taking any blood thinners:   Plavix? yes (75mg ) Aspirin? no Coumadin? no Brilinta? no Xarelto? no Eliquis? no Pradaxa? no Savaysa? no Effient? no  Patient confirms/reports the following medications:  Current Outpatient Medications  Medication Sig Dispense Refill  . amLODipine (NORVASC) 5 MG tablet Take 5 mg by mouth daily.    . clopidogrel (PLAVIX) 75 MG tablet Take 1 tablet (75 mg total) by mouth daily. 30 tablet 0   No current facility-administered medications for this visit.     Patient confirms/reports the following allergies:  No Known Allergies  No orders of the defined types were placed in this encounter.   AUTHORIZATION INFORMATION Primary Insurance: [place] ,  ID #: [id number] Pre-Cert / Berkley HarveyAuth required: no    SCHEDULE INFORMATION: Procedure has been scheduled as follows:  Date:12/04/17, Time: 10:45 Location:[facility] Dr. Jena Gaussourk  This Gastroenterology Pre-Precedure Review Form is being routed to the following provider(s): Wynne DustEric Gill NP

## 2017-10-14 NOTE — Telephone Encounter (Signed)
Patient called and stated he needs another day for his screening tcs.  He was originally scheduled on 4/17.  I rescheduled him to 12/04/17 at 10:45 am. I also made the change on the schedule in Epic  Routing to Monroe[place]ie to send out new prep instructions.

## 2017-10-15 NOTE — Telephone Encounter (Signed)
New instructions are done and mailed to the pt. Orders are in for the correct date.

## 2017-10-16 NOTE — Progress Notes (Signed)
On Plavix, no recent MI. On Plavix for history of CVA. Ok to schedule on Plavix.  Please tell the patient in the rare chance that he has a very large polyp we may need to bring him back off Plavix to have it removed.  If that were to happen, we would seek neurology's guidance related to his Plavix.

## 2017-10-16 NOTE — Progress Notes (Signed)
Pt is aware.  

## 2017-11-21 ENCOUNTER — Telehealth: Payer: Self-pay | Admitting: Internal Medicine

## 2017-11-21 NOTE — Telephone Encounter (Signed)
Pt said he can not find anyone to stay with his wife or to bring him to and from to his procedure on 4/24 with [facility] and wants to cancel procedure. He said if he could line up transportation and have someone sit with his wife then he would reschedule.

## 2017-11-21 NOTE — Telephone Encounter (Signed)
tcs has been taken off the schedule. I called and informed Kim.

## 2017-12-04 ENCOUNTER — Ambulatory Visit (HOSPITAL_COMMUNITY): Admit: 2017-12-04 | Payer: MEDICAID | Admitting: Internal Medicine

## 2017-12-04 ENCOUNTER — Encounter (HOSPITAL_COMMUNITY): Payer: Self-pay

## 2017-12-04 SURGERY — COLONOSCOPY
Anesthesia: Moderate Sedation

## 2018-10-31 ENCOUNTER — Encounter (INDEPENDENT_AMBULATORY_CARE_PROVIDER_SITE_OTHER): Payer: Self-pay | Admitting: Internal Medicine

## 2018-12-25 ENCOUNTER — Encounter (HOSPITAL_COMMUNITY): Payer: Self-pay | Admitting: Emergency Medicine

## 2018-12-25 ENCOUNTER — Emergency Department (HOSPITAL_COMMUNITY): Payer: BLUE CROSS/BLUE SHIELD

## 2018-12-25 ENCOUNTER — Other Ambulatory Visit: Payer: Self-pay

## 2018-12-25 ENCOUNTER — Inpatient Hospital Stay (HOSPITAL_COMMUNITY)
Admission: EM | Admit: 2018-12-25 | Discharge: 2018-12-27 | DRG: 247 | Disposition: A | Discharge status: Home / Self Care | Payer: BLUE CROSS/BLUE SHIELD | Attending: Internal Medicine | Admitting: Internal Medicine

## 2018-12-25 DIAGNOSIS — N189 Chronic kidney disease, unspecified: Secondary | ICD-10-CM | POA: Diagnosis present

## 2018-12-25 DIAGNOSIS — F329 Major depressive disorder, single episode, unspecified: Secondary | ICD-10-CM | POA: Diagnosis present

## 2018-12-25 DIAGNOSIS — R002 Palpitations: Secondary | ICD-10-CM | POA: Diagnosis not present

## 2018-12-25 DIAGNOSIS — Z720 Tobacco use: Secondary | ICD-10-CM | POA: Diagnosis present

## 2018-12-25 DIAGNOSIS — Z8249 Family history of ischemic heart disease and other diseases of the circulatory system: Secondary | ICD-10-CM

## 2018-12-25 DIAGNOSIS — F419 Anxiety disorder, unspecified: Secondary | ICD-10-CM | POA: Diagnosis present

## 2018-12-25 DIAGNOSIS — I2510 Atherosclerotic heart disease of native coronary artery without angina pectoris: Secondary | ICD-10-CM | POA: Diagnosis present

## 2018-12-25 DIAGNOSIS — Z1159 Encounter for screening for other viral diseases: Secondary | ICD-10-CM

## 2018-12-25 DIAGNOSIS — E785 Hyperlipidemia, unspecified: Secondary | ICD-10-CM | POA: Diagnosis present

## 2018-12-25 DIAGNOSIS — N179 Acute kidney failure, unspecified: Secondary | ICD-10-CM | POA: Diagnosis present

## 2018-12-25 DIAGNOSIS — I214 Non-ST elevation (NSTEMI) myocardial infarction: Secondary | ICD-10-CM

## 2018-12-25 DIAGNOSIS — Z716 Tobacco abuse counseling: Secondary | ICD-10-CM

## 2018-12-25 DIAGNOSIS — I129 Hypertensive chronic kidney disease with stage 1 through stage 4 chronic kidney disease, or unspecified chronic kidney disease: Secondary | ICD-10-CM | POA: Diagnosis present

## 2018-12-25 DIAGNOSIS — N289 Disorder of kidney and ureter, unspecified: Secondary | ICD-10-CM

## 2018-12-25 DIAGNOSIS — F17210 Nicotine dependence, cigarettes, uncomplicated: Secondary | ICD-10-CM | POA: Diagnosis present

## 2018-12-25 DIAGNOSIS — Z7902 Long term (current) use of antithrombotics/antiplatelets: Secondary | ICD-10-CM

## 2018-12-25 DIAGNOSIS — K219 Gastro-esophageal reflux disease without esophagitis: Secondary | ICD-10-CM | POA: Diagnosis present

## 2018-12-25 DIAGNOSIS — Z79899 Other long term (current) drug therapy: Secondary | ICD-10-CM

## 2018-12-25 DIAGNOSIS — I10 Essential (primary) hypertension: Secondary | ICD-10-CM | POA: Diagnosis present

## 2018-12-25 DIAGNOSIS — Z8673 Personal history of transient ischemic attack (TIA), and cerebral infarction without residual deficits: Secondary | ICD-10-CM

## 2018-12-25 LAB — D-DIMER, QUANTITATIVE: D-Dimer, Quant: 0.62 ug/mL-FEU — ABNORMAL HIGH (ref 0.00–0.50)

## 2018-12-25 LAB — BASIC METABOLIC PANEL
Anion gap: 13 (ref 5–15)
BUN: 15 mg/dL (ref 8–23)
CO2: 26 mmol/L (ref 22–32)
Calcium: 9.3 mg/dL (ref 8.9–10.3)
Chloride: 93 mmol/L — ABNORMAL LOW (ref 98–111)
Creatinine, Ser: 1.41 mg/dL — ABNORMAL HIGH (ref 0.61–1.24)
GFR calc Af Amer: >60 mL/min (ref >60)
GFR calc non Af Amer: 53 mL/min — ABNORMAL LOW (ref >60)
Glucose, Bld: 98 mg/dL (ref 70–99)
Potassium: 4.1 mmol/L (ref 3.5–5.1)
Sodium: 132 mmol/L — ABNORMAL LOW (ref 135–145)

## 2018-12-25 LAB — CBC
HCT: 44.2 % (ref 39.0–52.0)
Hemoglobin: 15.5 g/dL (ref 13.0–17.0)
MCH: 34.8 pg — ABNORMAL HIGH (ref 26.0–34.0)
MCHC: 35.1 g/dL (ref 30.0–36.0)
MCV: 99.1 fL (ref 80.0–100.0)
Platelets: 176 10*3/uL (ref 150–400)
RBC: 4.46 MIL/uL (ref 4.22–5.81)
RDW: 12 % (ref 11.5–15.5)
WBC: 10 10*3/uL (ref 4.0–10.5)
nRBC: 0 % (ref 0.0–0.2)

## 2018-12-25 LAB — TROPONIN I: Troponin I: 8.54 ng/mL (ref <0.03)

## 2018-12-25 LAB — PROTIME-INR
INR: 0.9 (ref 0.8–1.2)
Prothrombin Time: 12.3 seconds (ref 11.4–15.2)

## 2018-12-25 MED ORDER — ASPIRIN 81 MG PO CHEW
324.0000 mg | CHEWABLE_TABLET | Freq: Once | ORAL | Status: AC
  Administered 2018-12-25: 324 mg via ORAL
  Filled 2018-12-25: qty 4

## 2018-12-25 MED ORDER — SODIUM CHLORIDE 0.9% FLUSH: 3.0000 mL | Freq: Once | INTRAVENOUS | Status: DC

## 2018-12-25 MED ORDER — HEPARIN BOLUS VIA INFUSION
4000.0000 [IU] | Freq: Once | INTRAVENOUS | Status: AC
  Administered 2018-12-25: 4000 [IU] via INTRAVENOUS

## 2018-12-25 MED ORDER — HEPARIN (PORCINE) 25000 UT/250ML-% IV SOLN
1250.0000 [IU]/h | INTRAVENOUS | Status: DC
  Administered 2018-12-25: 1000 [IU]/h via INTRAVENOUS
  Filled 2018-12-25: qty 250

## 2018-12-25 NOTE — Progress Notes (Signed)
ANTICOAGULATION CONSULT NOTE - Initial Consult  Pharmacy Consult for heparin Indication: chest pain/ACS  No Known Allergies  Patient Measurements: Height: 5\' 6"  (167.6 cm) Weight: 181 lb (82.1 kg) IBW/kg (Calculated) : 63.8 Heparin Dosing Weight: 80 kg  Vital Signs: Temp: 99 F (37.2 C) (05/14 1924) Temp Source: Oral (05/14 1924) BP: 132/74 (05/14 2100) Pulse Rate: 90 (05/14 2100)  Labs: Recent Labs    12/25/18 2049  HGB 15.5  HCT 44.2  PLT 176  CREATININE 1.41*  TROPONINI 8.54*    Estimated Creatinine Clearance: 53.9 mL/min (A) (by C-G formula based on SCr of 1.41 mg/dL (H)).   Medical History: Past Medical History:  Diagnosis Date  . Anxiety and depression   . Chest pain 2005   03/2012: neg nuclear stress.  2005- Cardiac cath:20-40% lesions in the LAD, circumflex and RCA with normal EF  . Degenerative joint disease   . Hyperlipidemia   . Tobacco abuse    40 pack year total consumption    Medications:  (Not in a hospital admission)   Assessment: Pharmacy consulted to dose heparin in patient with ACS/STEMI.  Patients troponin on admission is 8.54. Not on anticoagulants prior to admission.  Goal of Therapy:  Heparin level 0.3-0.7 units/ml Monitor platelets by anticoagulation protocol: Yes   Plan:  Give 4000 units bolus x 1 Start heparin infusion at 1000 units/hr Check anti-Xa level in 6-8 hours and daily while on heparin Continue to monitor H&H and platelets  Tad Moore 12/25/2018,10:26 PM

## 2018-12-25 NOTE — ED Notes (Signed)
Date and time results received: 12/25/18 2221 (use smartphrase ".now" to insert current time)  Test: Troponin Critical Value: 8.54  Name of Provider Notified: Dr Juleen China  Orders Received? Or Actions Taken?:

## 2018-12-25 NOTE — ED Provider Notes (Signed)
[facility] EMERGENCY DEPARTMENT Provider Note   CSN: [id number] Arrival date & time: 12/25/18  1901    History   Chief Complaint Chief Complaint  Patient presents with  . Palpitations    HPI Michael Cortez is a 63 y.o. male with PMHx HTN and HLD who presents to the ED today complaining of palpitations. Pt reports while taking his blood pressure yesterday he noticed a heart rate reading of 115; the same today. He called his PCP who told him to come to the ED for further evaluation. Pt denies any other symptoms at this time including chest pain, shortness of breath, fever, abdominal pain, nausea, vomiting, diarrhea. He reports left sided chest pain 3 days ago that lasted about 1 hr; pt spoke with PCP who deduced it was likely his acid reflux; increased his omeprazole from 20 mg to 40 mg which improved pain. Pt has not had return of pain since medication increase. No recent prolonged travel or immobilization. No hx active malignancy. No hemoptysis. No hx DVT/PE. Pt is current everyday smoker.        Past Medical History:  Diagnosis Date  . Anxiety and depression   . Chest pain 2005   03/2012: neg nuclear stress.  2005- Cardiac cath:20-40% lesions in the LAD, circumflex and RCA with normal EF  . Degenerative joint disease   . Hyperlipidemia   . Tobacco abuse    40 pack year total consumption    Patient Active Problem List   Diagnosis Date Noted  . 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 CVA (cerebral infarction)   . Essential hypertension   . Acute CVA (cerebrovascular accident) (HCC) 02/18/2016  . Right arm weakness 02/18/2016  . Laboratory test 04/07/2012  . Chest pain   . Hyperlipidemia 03/27/2012  . Tobacco abuse 03/26/2012    Past Surgical History:  Procedure Laterality Date  . APPENDECTOMY          Home Medications    Prior to Admission medications   Medication Sig Start Date End Date Taking? Authorizing Provider  amLODipine (NORVASC) 5  MG tablet Take 5 mg by mouth daily.    [provider]  clopidogrel (PLAVIX) 75 MG tablet Take 1 tablet (75 mg total) by mouth daily. 02/19/16   Johnson, Clanford L, MD  polyethylene glycol-electrolytes (TRILYTE) 420 g solution Take 4,000 mLs by mouth as directed. 10/14/17   Anice Paganini, NP  nitroGLYCERIN (NITROSTAT) 0.4 MG SL tablet Place 1 tablet (0.4 mg total) under the tongue every 5 (five) minutes as needed for chest pain. Patient not taking: Reported on 02/17/2016 03/31/12 02/17/16  Donnetta Hutching, MD    Family History Family History  Problem Relation Age of Onset  . Coronary artery disease Father 64       Fatal myocardial infarction    Social History Social History   Tobacco Use  . Smoking status: Current Every Day Smoker    Packs/day: 1.00    Years: 40.00    Pack years: 40.00  . Smokeless tobacco: Never Used  Substance Use Topics  . Alcohol use: No  . Drug use: No     Allergies   Patient has no known allergies.   Review of Systems Review of Systems  Constitutional: Negative for chills and fever.  HENT: Negative for congestion.   Eyes: Negative for redness.  Respiratory: Negative for cough and shortness of breath.   Cardiovascular: Positive for palpitations. Negative for chest pain and leg swelling.  Gastrointestinal:  Negative for abdominal pain, nausea and vomiting.  Genitourinary: Negative for dysuria.  Musculoskeletal: Negative for myalgias.  Skin: Negative for rash.  Neurological: Negative for weakness, numbness and headaches.     Physical Exam Updated Vital Signs BP 132/74   Pulse 90   Temp 99 F (37.2 C) (Oral)   Resp 13   Ht 5\' 6"  (1.676 m)   Wt 82.1 kg   SpO2 96%   BMI 29.21 kg/m   Physical Exam Vitals signs and nursing note reviewed.  Constitutional:      Appearance: He is not ill-appearing.  HENT:     Head: Normocephalic and atraumatic.  Eyes:     Conjunctiva/sclera: Conjunctivae normal.  Neck:     Musculoskeletal: Neck supple.   Cardiovascular:     Rate and Rhythm: Regular rhythm. Tachycardia present.     Pulses: Normal pulses.  Pulmonary:     Effort: Pulmonary effort is normal.     Breath sounds: Normal breath sounds. No wheezing, rhonchi or rales.  Abdominal:     General: Abdomen is flat.     Palpations: Abdomen is soft.     Tenderness: There is no abdominal tenderness.  Musculoskeletal:     Right lower leg: No edema.     Left lower leg: No edema.  Skin:    General: Skin is warm and dry.  Neurological:     Mental Status: He is alert.      ED Treatments / Results  Labs (all labs ordered are listed, but only abnormal results are displayed) Labs Reviewed  BASIC METABOLIC PANEL - Abnormal; Notable for the following components:      Result Value   Sodium 132 (*)    Chloride 93 (*)    Creatinine, Ser 1.41 (*)    GFR calc non Af Amer 53 (*)    All other components within normal limits  CBC - Abnormal; Notable for the following components:   MCH 34.8 (*)    All other components within normal limits  TROPONIN I - Abnormal; Notable for the following components:   Troponin I 8.54 (*)    All other components within normal limits  D-DIMER, QUANTITATIVE (NOT AT [facility]) - Abnormal; Notable for the following components:   D-Dimer, Quant 0.62 (*)    All other components within normal limits  SARS CORONAVIRUS 2 (HOSPITAL ORDER, PERFORMED IN [facility])  PROTIME-INR  CBC  HEPARIN LEVEL (UNFRACTIONATED)    EKG EKG Interpretation  Date/Time:  Thursday Dec 25 2018 19:34:17 EDT Ventricular Rate:  99 PR Interval:  142 QRS Duration: 92 QT Interval:  320 QTC Calculation: 410 R Axis:   66 Text Interpretation:  Normal sinus rhythm Biatrial enlargement Nonspecific ST and T wave abnormality Abnormal ECG Confirmed by Raeford Razor [phone]) on 12/25/2018 8:44:39 PM   Radiology Dg Chest 2 View  Result Date: 12/25/2018 CLINICAL DATA:  Chest pain for 4 days. EXAM: CHEST - 2 VIEW COMPARISON:  PA and  lateral chest 02/17/2016. FINDINGS: Lungs clear. Heart size normal. No pneumothorax or pleural effusion. No acute or focal bony abnormality. IMPRESSION: No acute disease. Electronically Signed   By: Drusilla Kanner M.D.   On: 12/25/2018 20:49    Procedures Procedures (including critical care time)  Medications Ordered in ED Medications  sodium chloride flush (NS) 0.9 % injection 3 mL (3 mLs Intravenous Not Given 12/25/18 2057)  aspirin chewable tablet 324 mg (0 mg Oral Hold 12/25/18 2230)  heparin ADULT infusion 100 units/mL (25000  units/21350mL sodium chloride 0.45%) (1,000 Units/hr Intravenous New Bag/Given 12/25/18 2301)  heparin bolus via infusion 4,000 Units (4,000 Units Intravenous Bolus from Bag 12/25/18 2302)     Initial Impression / Assessment and Plan / ED Course  I have reviewed the triage vital signs and the nursing notes.  Pertinent labs & imaging results that were available during my care of the patient were reviewed by me and considered in my medical decision making (see chart for details).    Pt is a 63 year old male who presents to the ED with palpitations of 115 earlier today and yesterday. No other complaints at this time. Reports he had 1 hr of left sided/epigastric chest pain 4 days ago which resolved after his PCP increased his omeprazole from 20 to 40 mg. Currently heart rate in the high 90's. Baseline screening labs ordered including CBC, BMP, troponin, and d-dimer as pt cannot be ruled [facility] rules given [facility]. CXR negative; ekg with nonspecific ST changes compared to previous.   2221 Dr. Juleen ChinaKohut attending physician notified of elevated troponin of 8.54; heparin initiated. Will consult cardiologist for admission.   11:16 PM Discussed case with cardiologist Dr.Koneswaran who will admit patient.        Final Clinical Impressions(s) / ED Diagnoses   Final diagnoses:  NSTEMI (non-ST elevated myocardial infarction) [facility](HCC)  Palpitations    ED Discharge Orders     None       Tanda RockersVenter, Shizuye Rupert, PA-C 12/26/18 0044    Raeford RazorKohut, Stephen, MD 12/29/18 1205

## 2018-12-25 NOTE — ED Triage Notes (Signed)
Pt reports hr was up to 116 yesterday.  Spoke with pcp today and was told to come to ED

## 2018-12-26 ENCOUNTER — Inpatient Hospital Stay (HOSPITAL_COMMUNITY)
Admission: EM | Disposition: A | Discharge status: Home / Self Care | Payer: Self-pay | Source: Home / Self Care | Attending: Interventional Cardiology

## 2018-12-26 ENCOUNTER — Encounter (HOSPITAL_COMMUNITY): Payer: Self-pay

## 2018-12-26 ENCOUNTER — Observation Stay (HOSPITAL_BASED_OUTPATIENT_CLINIC_OR_DEPARTMENT_OTHER): Payer: BLUE CROSS/BLUE SHIELD

## 2018-12-26 DIAGNOSIS — Z8673 Personal history of transient ischemic attack (TIA), and cerebral infarction without residual deficits: Secondary | ICD-10-CM | POA: Diagnosis not present

## 2018-12-26 DIAGNOSIS — F329 Major depressive disorder, single episode, unspecified: Secondary | ICD-10-CM | POA: Diagnosis present

## 2018-12-26 DIAGNOSIS — Z7902 Long term (current) use of antithrombotics/antiplatelets: Secondary | ICD-10-CM | POA: Diagnosis not present

## 2018-12-26 DIAGNOSIS — F419 Anxiety disorder, unspecified: Secondary | ICD-10-CM | POA: Diagnosis present

## 2018-12-26 DIAGNOSIS — Z1159 Encounter for screening for other viral diseases: Secondary | ICD-10-CM | POA: Diagnosis not present

## 2018-12-26 DIAGNOSIS — R002 Palpitations: Secondary | ICD-10-CM | POA: Diagnosis present

## 2018-12-26 DIAGNOSIS — I214 Non-ST elevation (NSTEMI) myocardial infarction: Secondary | ICD-10-CM

## 2018-12-26 DIAGNOSIS — I2510 Atherosclerotic heart disease of native coronary artery without angina pectoris: Secondary | ICD-10-CM | POA: Diagnosis present

## 2018-12-26 DIAGNOSIS — N189 Chronic kidney disease, unspecified: Secondary | ICD-10-CM | POA: Diagnosis present

## 2018-12-26 DIAGNOSIS — I10 Essential (primary) hypertension: Secondary | ICD-10-CM | POA: Diagnosis not present

## 2018-12-26 DIAGNOSIS — K219 Gastro-esophageal reflux disease without esophagitis: Secondary | ICD-10-CM | POA: Diagnosis present

## 2018-12-26 DIAGNOSIS — Z720 Tobacco use: Secondary | ICD-10-CM | POA: Diagnosis not present

## 2018-12-26 DIAGNOSIS — E785 Hyperlipidemia, unspecified: Secondary | ICD-10-CM | POA: Diagnosis present

## 2018-12-26 DIAGNOSIS — E782 Mixed hyperlipidemia: Secondary | ICD-10-CM | POA: Diagnosis not present

## 2018-12-26 DIAGNOSIS — N179 Acute kidney failure, unspecified: Secondary | ICD-10-CM | POA: Diagnosis present

## 2018-12-26 DIAGNOSIS — Z716 Tobacco abuse counseling: Secondary | ICD-10-CM | POA: Diagnosis not present

## 2018-12-26 DIAGNOSIS — Z79899 Other long term (current) drug therapy: Secondary | ICD-10-CM | POA: Diagnosis not present

## 2018-12-26 DIAGNOSIS — Z8249 Family history of ischemic heart disease and other diseases of the circulatory system: Secondary | ICD-10-CM | POA: Diagnosis not present

## 2018-12-26 DIAGNOSIS — F17210 Nicotine dependence, cigarettes, uncomplicated: Secondary | ICD-10-CM | POA: Diagnosis present

## 2018-12-26 DIAGNOSIS — I129 Hypertensive chronic kidney disease with stage 1 through stage 4 chronic kidney disease, or unspecified chronic kidney disease: Secondary | ICD-10-CM | POA: Diagnosis present

## 2018-12-26 HISTORY — PX: LEFT HEART CATH AND CORONARY ANGIOGRAPHY: CATH118249

## 2018-12-26 HISTORY — PX: CORONARY STENT INTERVENTION: CATH118234

## 2018-12-26 LAB — BASIC METABOLIC PANEL
Anion gap: 13 (ref 5–15)
BUN: 16 mg/dL (ref 8–23)
CO2: 24 mmol/L (ref 22–32)
Calcium: 9.5 mg/dL (ref 8.9–10.3)
Chloride: 98 mmol/L (ref 98–111)
Creatinine, Ser: 1.49 mg/dL — ABNORMAL HIGH (ref 0.61–1.24)
GFR calc Af Amer: 57 mL/min — ABNORMAL LOW (ref >60)
GFR calc non Af Amer: 49 mL/min — ABNORMAL LOW (ref >60)
Glucose, Bld: 96 mg/dL (ref 70–99)
Potassium: 4.8 mmol/L (ref 3.5–5.1)
Sodium: 135 mmol/L (ref 135–145)

## 2018-12-26 LAB — ECHOCARDIOGRAM COMPLETE
Height: 66 in
Weight: 2870.4 oz

## 2018-12-26 LAB — HEPARIN LEVEL (UNFRACTIONATED): Heparin Unfractionated: 0.11 IU/mL — ABNORMAL LOW (ref 0.30–0.70)

## 2018-12-26 LAB — CBC
HCT: 46.5 % (ref 39.0–52.0)
Hemoglobin: 16.7 g/dL (ref 13.0–17.0)
MCH: 34.9 pg — ABNORMAL HIGH (ref 26.0–34.0)
MCHC: 35.9 g/dL (ref 30.0–36.0)
MCV: 97.1 fL (ref 80.0–100.0)
Platelets: 215 10*3/uL (ref 150–400)
RBC: 4.79 MIL/uL (ref 4.22–5.81)
RDW: 11.9 % (ref 11.5–15.5)
WBC: 9.1 10*3/uL (ref 4.0–10.5)
nRBC: 0 % (ref 0.0–0.2)

## 2018-12-26 LAB — POCT ACTIVATED CLOTTING TIME
Activated Clotting Time: 296 seconds
Activated Clotting Time: 0 seconds
Activated Clotting Time: 313 seconds

## 2018-12-26 LAB — TROPONIN I
Troponin I: 7.88 ng/mL (ref <0.03)
Troponin I: 8.2 ng/mL (ref <0.03)
Troponin I: 11.6 ng/mL (ref <0.03)

## 2018-12-26 LAB — LIPID PANEL
Cholesterol: 173 mg/dL (ref 0–200)
HDL: 53 mg/dL (ref >40)
LDL Cholesterol: 98 mg/dL (ref 0–99)
Total CHOL/HDL Ratio: 3.3 RATIO
Triglycerides: 111 mg/dL (ref <150)
VLDL: 22 mg/dL (ref 0–40)

## 2018-12-26 LAB — SARS CORONAVIRUS 2 BY RT PCR (HOSPITAL ORDER, PERFORMED IN ~~LOC~~ HOSPITAL LAB): SARS Coronavirus 2: NEGATIVE

## 2018-12-26 SURGERY — LEFT HEART CATH AND CORONARY ANGIOGRAPHY
Anesthesia: LOCAL

## 2018-12-26 MED ORDER — HEPARIN (PORCINE) IN NACL 1000-0.9 UT/500ML-% IV SOLN
INTRAVENOUS | Status: DC | PRN
  Administered 2018-12-26: 500 mL

## 2018-12-26 MED ORDER — MIDAZOLAM HCL 2 MG/2ML IJ SOLN
INTRAMUSCULAR | Status: AC
  Filled 2018-12-26: qty 2

## 2018-12-26 MED ORDER — CLOPIDOGREL BISULFATE 75 MG PO TABS
ORAL_TABLET | ORAL | Status: AC
  Filled 2018-12-26: qty 1

## 2018-12-26 MED ORDER — CLOPIDOGREL BISULFATE 75 MG PO TABS: 75.0000 mg | ORAL_TABLET | Freq: Every day | ORAL | Status: DC

## 2018-12-26 MED ORDER — FENTANYL CITRATE (PF) 100 MCG/2ML IJ SOLN
INTRAMUSCULAR | Status: AC
  Filled 2018-12-26: qty 2

## 2018-12-26 MED ORDER — SODIUM CHLORIDE 0.9 % IV SOLN: 250.0000 mL | INTRAVENOUS | Status: DC | PRN

## 2018-12-26 MED ORDER — ONDANSETRON HCL 4 MG/2ML IJ SOLN: 4.0000 mg | Freq: Four times a day (QID) | INTRAMUSCULAR | Status: DC | PRN

## 2018-12-26 MED ORDER — SODIUM CHLORIDE 0.9% FLUSH: 3.0000 mL | INTRAVENOUS | Status: DC | PRN

## 2018-12-26 MED ORDER — SODIUM CHLORIDE 0.9 % WEIGHT BASED INFUSION
3.0000 mL/kg/h | INTRAVENOUS | Status: DC
  Administered 2018-12-26: 3 mL/kg/h via INTRAVENOUS

## 2018-12-26 MED ORDER — SODIUM CHLORIDE 0.9 % WEIGHT BASED INFUSION: 3.0000 mL/kg/h | INTRAVENOUS | Status: DC

## 2018-12-26 MED ORDER — CLOPIDOGREL BISULFATE 75 MG PO TABS
75.0000 mg | ORAL_TABLET | Freq: Every day | ORAL | Status: DC
  Administered 2018-12-26 – 2018-12-27 (×2): 75 mg via ORAL
  Filled 2018-12-26 (×2): qty 1

## 2018-12-26 MED ORDER — HEPARIN (PORCINE) IN NACL 1000-0.9 UT/500ML-% IV SOLN
INTRAVENOUS | Status: AC
  Filled 2018-12-26: qty 1000

## 2018-12-26 MED ORDER — CLOPIDOGREL BISULFATE 300 MG PO TABS
ORAL_TABLET | ORAL | Status: DC | PRN
  Administered 2018-12-26: 150 mg via ORAL

## 2018-12-26 MED ORDER — DIAZEPAM 5 MG PO TABS: 5.0000 mg | ORAL_TABLET | Freq: Four times a day (QID) | ORAL | Status: DC | PRN

## 2018-12-26 MED ORDER — ASPIRIN EC 81 MG PO TBEC
81.0000 mg | DELAYED_RELEASE_TABLET | Freq: Every day | ORAL | Status: DC
  Administered 2018-12-27: 81 mg via ORAL
  Filled 2018-12-26: qty 1

## 2018-12-26 MED ORDER — HEPARIN SODIUM (PORCINE) 5000 UNIT/ML IJ SOLN
5000.0000 [IU] | Freq: Three times a day (TID) | INTRAMUSCULAR | Status: DC
  Administered 2018-12-27: 5000 [IU] via SUBCUTANEOUS
  Filled 2018-12-26: qty 1

## 2018-12-26 MED ORDER — HYDRALAZINE HCL 20 MG/ML IJ SOLN: 10.0000 mg | INTRAMUSCULAR | Status: AC | PRN

## 2018-12-26 MED ORDER — SODIUM CHLORIDE 0.9 % WEIGHT BASED INFUSION: 1.0000 mL/kg/h | INTRAVENOUS | Status: DC

## 2018-12-26 MED ORDER — LIDOCAINE HCL (PF) 1 % IJ SOLN
INTRAMUSCULAR | Status: AC
  Filled 2018-12-26: qty 30

## 2018-12-26 MED ORDER — NICOTINE 21 MG/24HR TD PT24
21.0000 mg | MEDICATED_PATCH | Freq: Every day | TRANSDERMAL | Status: DC
  Filled 2018-12-26: qty 1

## 2018-12-26 MED ORDER — SODIUM CHLORIDE 0.9% FLUSH
3.0000 mL | Freq: Two times a day (BID) | INTRAVENOUS | Status: DC
  Administered 2018-12-26 – 2018-12-27 (×3): 3 mL via INTRAVENOUS

## 2018-12-26 MED ORDER — LABETALOL HCL 5 MG/ML IV SOLN: 10.0000 mg | INTRAVENOUS | Status: AC | PRN

## 2018-12-26 MED ORDER — ASPIRIN 81 MG PO CHEW
81.0000 mg | CHEWABLE_TABLET | ORAL | Status: AC
  Administered 2018-12-26: 81 mg via ORAL
  Filled 2018-12-26: qty 1

## 2018-12-26 MED ORDER — SODIUM CHLORIDE 0.9% FLUSH
3.0000 mL | Freq: Two times a day (BID) | INTRAVENOUS | Status: DC
  Administered 2018-12-26 – 2018-12-27 (×2): 3 mL via INTRAVENOUS

## 2018-12-26 MED ORDER — SODIUM CHLORIDE 0.9 % IV SOLN
INTRAVENOUS | Status: AC
  Administered 2018-12-26 (×2): via INTRAVENOUS

## 2018-12-26 MED ORDER — NITROGLYCERIN 1 MG/10 ML FOR IR/CATH LAB
INTRA_ARTERIAL | Status: DC | PRN
  Administered 2018-12-26 (×2): 200 ug via INTRACORONARY

## 2018-12-26 MED ORDER — AMLODIPINE BESYLATE 5 MG PO TABS
5.0000 mg | ORAL_TABLET | Freq: Every day | ORAL | Status: DC
  Administered 2018-12-26 – 2018-12-27 (×2): 5 mg via ORAL
  Filled 2018-12-26 (×2): qty 1

## 2018-12-26 MED ORDER — HEPARIN SODIUM (PORCINE) 1000 UNIT/ML IJ SOLN
INTRAMUSCULAR | Status: DC | PRN
  Administered 2018-12-26: 5000 [IU] via INTRAVENOUS
  Administered 2018-12-26: 1000 [IU] via INTRAVENOUS
  Administered 2018-12-26: 4000 [IU] via INTRAVENOUS

## 2018-12-26 MED ORDER — ATORVASTATIN CALCIUM 40 MG PO TABS: 40.0000 mg | ORAL_TABLET | Freq: Every day | ORAL | Status: DC

## 2018-12-26 MED ORDER — PERFLUTREN LIPID MICROSPHERE
1.0000 mL | INTRAVENOUS | Status: AC | PRN
  Administered 2018-12-26: 4 mL via INTRAVENOUS
  Filled 2018-12-26: qty 10

## 2018-12-26 MED ORDER — FENTANYL CITRATE (PF) 100 MCG/2ML IJ SOLN
INTRAMUSCULAR | Status: DC | PRN
  Administered 2018-12-26 (×2): 25 ug via INTRAVENOUS

## 2018-12-26 MED ORDER — MIDAZOLAM HCL 2 MG/2ML IJ SOLN
INTRAMUSCULAR | Status: DC | PRN
  Administered 2018-12-26: 2 mg via INTRAVENOUS
  Administered 2018-12-26: 1 mg via INTRAVENOUS

## 2018-12-26 MED ORDER — NITROGLYCERIN 0.4 MG SL SUBL: 0.4000 mg | SUBLINGUAL_TABLET | SUBLINGUAL | Status: DC | PRN

## 2018-12-26 MED ORDER — ASPIRIN 81 MG PO CHEW: 81.0000 mg | CHEWABLE_TABLET | Freq: Every day | ORAL | Status: DC

## 2018-12-26 MED ORDER — HEPARIN BOLUS VIA INFUSION
2400.0000 [IU] | Freq: Once | INTRAVENOUS | Status: AC
  Administered 2018-12-26: 2400 [IU] via INTRAVENOUS
  Filled 2018-12-26: qty 2400

## 2018-12-26 MED ORDER — VERAPAMIL HCL 2.5 MG/ML IV SOLN
INTRAVENOUS | Status: DC | PRN
  Administered 2018-12-26: 10 mL via INTRA_ARTERIAL

## 2018-12-26 MED ORDER — ACETAMINOPHEN 325 MG PO TABS: 650.0000 mg | ORAL_TABLET | ORAL | Status: DC | PRN

## 2018-12-26 MED ORDER — SODIUM CHLORIDE 0.9 % IV SOLN
INTRAVENOUS | Status: AC
  Administered 2018-12-27: 06:00:00 via INTRAVENOUS

## 2018-12-26 MED ORDER — ATORVASTATIN CALCIUM 80 MG PO TABS
80.0000 mg | ORAL_TABLET | Freq: Every day | ORAL | Status: DC
  Administered 2018-12-26: 80 mg via ORAL
  Filled 2018-12-26: qty 1

## 2018-12-26 MED ORDER — ASPIRIN 81 MG PO CHEW: 81.0000 mg | CHEWABLE_TABLET | ORAL | Status: DC

## 2018-12-26 MED ORDER — IOHEXOL 350 MG/ML SOLN
INTRAVENOUS | Status: DC | PRN
  Administered 2018-12-26: 140 mL via INTRA_ARTERIAL

## 2018-12-26 MED ORDER — NITROGLYCERIN 1 MG/10 ML FOR IR/CATH LAB
INTRA_ARTERIAL | Status: AC
  Filled 2018-12-26: qty 10

## 2018-12-26 MED ORDER — LIDOCAINE HCL (PF) 1 % IJ SOLN
INTRAMUSCULAR | Status: DC | PRN
  Administered 2018-12-26: 2 mL

## 2018-12-26 MED ORDER — VERAPAMIL HCL 2.5 MG/ML IV SOLN
INTRAVENOUS | Status: AC
  Filled 2018-12-26: qty 2

## 2018-12-26 SURGICAL SUPPLY — 18 items
BALLN SAPPHIRE 2.0X12 (BALLOONS) ×2
BALLN SAPPHIRE ~~LOC~~ 2.25X12 (BALLOONS) ×1 IMPLANT
BALLOON SAPPHIRE 2.0X12 (BALLOONS) IMPLANT
CATH OPTITORQUE TIG 4.0 5F (CATHETERS) ×1 IMPLANT
CATH VISTA GUIDE 6FR XB3.5 (CATHETERS) ×1 IMPLANT
CATH VISTA GUIDE 6FR XB3.5 SH (CATHETERS) ×1 IMPLANT
COVER DOME SNAP 22 D (MISCELLANEOUS) ×1 IMPLANT
DEVICE RAD COMP TR BAND LRG (VASCULAR PRODUCTS) ×1 IMPLANT
GLIDESHEATH SLEND SS 6F .021 (SHEATH) ×1 IMPLANT
GUIDEWIRE INQWIRE 1.5J.035X260 (WIRE) IMPLANT
INQWIRE 1.5J .035X260CM (WIRE) ×2
KIT ENCORE 26 ADVANTAGE (KITS) ×1 IMPLANT
KIT HEART LEFT (KITS) ×2 IMPLANT
PACK CARDIAC CATHETERIZATION (CUSTOM PROCEDURE TRAY) ×2 IMPLANT
STENT RESOLUTE ONYX 2.25X18 (Permanent Stent) ×1 IMPLANT
TRANSDUCER W/STOPCOCK (MISCELLANEOUS) ×2 IMPLANT
TUBING CIL FLEX 10 FLL-RA (TUBING) ×2 IMPLANT
WIRE ASAHI PROWATER 180CM (WIRE) ×1 IMPLANT

## 2018-12-26 NOTE — ED Notes (Addendum)
Report given to [facility]. 

## 2018-12-26 NOTE — Progress Notes (Signed)
ANTICOAGULATION CONSULT NOTE - Follow Up Consult  Pharmacy Consult for Heparin Indication: chest pain/ACS  No Known Allergies  Patient Measurements: Height: 5\' 6"  (167.6 cm) Weight: 179 lb 6.4 oz (81.4 kg) IBW/kg (Calculated) : 63.8 Heparin Dosing Weight: 80 kg  Vital Signs: Temp: 98.6 F (37 C) (05/15 0542) Temp Source: Oral (05/15 0542) BP: 107/56 (05/15 0542) Pulse Rate: 91 (05/15 0542)  Labs: Recent Labs    12/25/18 2049 12/26/18 0825 12/26/18 0944  HGB 15.5  --  16.7  HCT 44.2  --  46.5  PLT 176  --  215  LABPROT 12.3  --   --   INR 0.9  --   --   HEPARINUNFRC  --   --  0.11*  CREATININE 1.41*  --  1.49*  TROPONINI 8.54* 7.88*  --     Estimated Creatinine Clearance: 50.8 mL/min (A) (by C-G formula based on SCr of 1.49 mg/dL (H)).  Assessment:  Anticoag: IV heparin started for CP. HL 0.11 low. Hgb 16.7 and plts WNL  Goal of Therapy:  Heparin level 0.3-0.7 units/ml Monitor platelets by anticoagulation protocol: Yes   Plan:  Bolus IV heparin 2400 units Increase infusion to 1250 units/hr Daily HL and CBC F/u after cath today   Mirza Fessel S. Merilynn Finland, PharmD, BCPS Clinical Staff Pharmacist Misty Stanley Stillinger 12/26/2018,11:32 AM

## 2018-12-26 NOTE — Interval H&P Note (Signed)
Cath Lab Visit (complete for each Cath Lab visit)  Clinical Evaluation Leading to the Procedure:   ACS: Yes.    Non-ACS:    Anginal Classification: CCS III  Anti-ischemic medical therapy: Maximal Therapy (2 or more classes of medications)  Non-Invasive Test Results: No non-invasive testing performed  Prior CABG: No previous CABG      History and Physical Interval Note:  12/26/2018 12:41 PM  Michael Cortez  has presented today for surgery, with the diagnosis of nonstemi.  The various methods of treatment have been discussed with the patient and family. After consideration of risks, benefits and other options for treatment, the patient has consented to  Procedure(s): LEFT HEART CATH AND CORONARY ANGIOGRAPHY (N/A) as a surgical intervention.  The patient's history has been reviewed, patient examined, no change in status, stable for surgery.  I have reviewed the patient's chart and labs.  Questions were answered to the patient's satisfaction.     Nicki Guadalajara

## 2018-12-26 NOTE — Progress Notes (Signed)
  Echocardiogram 2D Echocardiogram with Definity has been performed.  Michael Cortez 12/26/2018, 11:54 AM

## 2018-12-26 NOTE — Progress Notes (Signed)
Cardiac Rehab Advisory Cardiac Rehab Phase I is not seeing pts face to face at this time due to Covid 19 restrictions. Ambulation is occurring through nursing, PT, and mobility teams. We will help facilitate that process as needed. We are calling pts in their rooms and discussing education. We will then deliver education materials to pts RN for delivery to pt.    MI/stent education reviewed with patient via telephone including Plavix use (pt already on Plavix since CVA in 2017), CP and calling 911, restrictions, risk factor modification, tobacco cessation, heart healthy eating, and exercise guidelines. Pt verbalizes understanding of instructions given. MI book, nutrition handouts, and activity progression given. Discussed Phase 2 Cardiac Rehab, and pt is interested in participating in the program at [facility] if his insurance covers the program, referral sent.  Artist Pais, MS, ACSM CEP [phone]

## 2018-12-26 NOTE — Progress Notes (Signed)
Progress Note  Patient Name: Michael Cortez Date of Encounter: 12/26/2018  [facility]  Subjective   No chest pain.  Had severe GERD 3 days ago.  Inpatient Medications    Scheduled Meds: . amLODipine  5 mg Oral Daily  . [START ON 12/27/2018] aspirin EC  81 mg Oral Daily  . atorvastatin  40 mg Oral q1800  . clopidogrel  75 mg Oral Daily  . nicotine  21 mg Transdermal Daily  . sodium chloride flush  3 mL Intravenous Once   Continuous Infusions: . heparin 1,000 Units/hr (12/25/18 2301)   PRN Meds: acetaminophen, nitroGLYCERIN, ondansetron (ZOFRAN) IV   Vital Signs    Vitals:   12/26/18 0100 12/26/18 0130 12/26/18 0303 12/26/18 0542  BP: 122/65 127/65 109/67 (!) 107/56  Pulse: 80 84 97 91  Resp: 20 18 12 14   Temp:   98.7 F (37.1 C) 98.6 F (37 C)  TempSrc:   Oral Oral  SpO2: 95% 95% 98% 98%  Weight:   81.4 kg   Height:   5\' 6"  (1.676 m)     Intake/Output Summary (Last 24 hours) at 12/26/2018 0745 Last data filed at 12/26/2018 0303 Gross per 24 hour  Intake 10 ml  Output 1100 ml  Net -1090 ml   Last 3 Weights 12/26/2018 12/25/2018 02/18/2016  Weight (lbs) 179 lb 6.4 oz 181 lb 194 lb  Weight (kg) 81.375 kg 82.101 kg 87.998 kg      Telemetry    NSR - Personally Reviewed  ECG    NSR, NSST - Personally Reviewed  Physical Exam   GEN: No acute distress.   Neck: No JVD Cardiac: RRR, no murmurs, rubs, or gallops.  Respiratory: Clear to auscultation bilaterally. GI: Soft, nontender, non-distended  MS: No edema; No deformity. Neuro:  Nonfocal  Psych: Normal affect   Labs    Chemistry Recent Labs  Lab 12/25/18 2049  NA 132*  K 4.1  CL 93*  CO2 26  GLUCOSE 98  BUN 15  CREATININE 1.41*  CALCIUM 9.3  GFRNONAA 53*  GFRAA >60  ANIONGAP 13     Hematology Recent Labs  Lab 12/25/18 2049  WBC 10.0  RBC 4.46  HGB 15.5  HCT 44.2  MCV 99.1  MCH 34.8*  MCHC 35.1  RDW 12.0  PLT 176    Cardiac Enzymes Recent Labs   Lab 12/25/18 2049  TROPONINI 8.54*     DDimer  Recent Labs  Lab 12/25/18 2049  DDIMER 0.62*     Radiology    Dg Chest 2 View  Result Date: 12/25/2018 CLINICAL DATA:  Chest pain for 4 days. EXAM: CHEST - 2 VIEW COMPARISON:  PA and lateral chest 02/17/2016. FINDINGS: Lungs clear. Heart size normal. No pneumothorax or pleural effusion. No acute or focal bony abnormality. IMPRESSION: No acute disease. Electronically Signed   By: Drusilla Kannerhomas  Dalessio M.D.   On: 12/25/2018 20:49    Cardiac Studies   None  Patient Profile     63 y.o. male  with history of stroke (on Plavix), HLD, HTN and active smoking who presents chest pain and palpitations.  ECG showed NSR with NS ST-TW changes.  Labs were notable for a troponin of 8.54 and Cr 1.4.  COVID PCR screen was negative.  He was started on IV heparin and [facility].   Assessment & Plan    1. NSTEMI - Troponin 8.54 on admit. Will cycle enzyme.  EKG with non specific ST/T wave changes. Chest pain free. Treated with IV heparin.Likely cath today.   2. HTN - BP stable on home amlodipine.   3. HLD - On lipitor 40mg . Check lipid panel.   4. Tobacco smoking - Advised cessation. On nicotine patch.   5. AKI - Unknown baseline. Follow closely.  6. Palpitations - Follow on telemetry  7. Hx of Stroke - On Plavix  For questions or updates, please contact [facility] Please consult [email or web address] for contact info under        Signed, Manson Passey, PA  12/26/2018, 7:45 AM    I have examined the patient and reviewed assessment and plan and discussed with patient.  Agree with above as stated.    Cardiac catheterization was discussed with the patient fully. The patient understands that risks include but are not limited to stroke (1 in 1000), death (1 in 1000), kidney failure [usually temporary] (1 in 500), bleeding (1 in 200), allergic reaction [possibly serious] (1 in 200).  The  patient understands and is willing to proceed.    He is agreeable to cath.  IV hydration given mild CRI.   Lance Muss

## 2018-12-26 NOTE — H&P (Signed)
Cardiology History & Physical    Patient ID: Michael Shellony E Naeem MRN: [id number], DOB: 1956-01-01 Date of Encounter: 12/26/2018, 12:36 AM Primary Physician: Wilson SingerGosrani, Nimish C, MD  Chief Complaint: Chest pain, palpitations   HPI: Michael Cortez is a 63 y.o. male with history of stroke (on Plavix), HLD, and active smoking who presents chest pain and palpitaitons.  Pt had a severe episode of CP approximately 4 days ago that lasted about an hour.  He spoke with PCP and his dose of omeprazole was empirically increased from 20 mg QD to 40 mg QD.  The CP has not recurred in the interim.  Earlier today, pt felt onset of palpitations and measured his HR to be 115 on a home monitor.  He was instructed by his PCP to present to the ED for evaluation.  In the ED, VS were WNL.  ECG showed NSR with NS ST-TW changes.  Labs were notable for a troponin of 8.54 and Cr 1.4.  COVID PCR screen was negative.  He was started on IV heparin and [facility] for further management.  He remained CP free throughout his ED stay.  Past Medical History:  Diagnosis Date  . Anxiety and depression   . Chest pain 2005   03/2012: neg nuclear stress.  2005- Cardiac cath:20-40% lesions in the LAD, circumflex and RCA with normal EF  . Degenerative joint disease   . Hyperlipidemia   . Tobacco abuse    40 pack year total consumption     Surgical History:  Past Surgical History:  Procedure Laterality Date  . APPENDECTOMY       Home Meds: Prior to Admission medications   Medication Sig Start Date End Date Taking? Authorizing Provider  amLODipine (NORVASC) 5 MG tablet Take 5 mg by mouth daily.    [provider]  clopidogrel (PLAVIX) 75 MG tablet Take 1 tablet (75 mg total) by mouth daily. 02/19/16   Johnson, Clanford L, MD  polyethylene glycol-electrolytes (TRILYTE) 420 g solution Take 4,000 mLs by mouth as directed. 10/14/17   Anice PaganiniGill, Eric A, NP  nitroGLYCERIN (NITROSTAT) 0.4 MG SL tablet Place  1 tablet (0.4 mg total) under the tongue every 5 (five) minutes as needed for chest pain. Patient not taking: Reported on 02/17/2016 03/31/12 02/17/16  Donnetta Hutchingook, Brian, MD    Allergies: No Known Allergies  Social History   Socioeconomic History  . Marital status: Married    Spouse name: Not on file  . Number of children: Not on file  . Years of education: Not on file  . Highest education level: Not on file  Occupational History  . Not on file  Social Needs  . Financial resource strain: Not on file  . Food insecurity:    Worry: Not on file    Inability: Not on file  . Transportation needs:    Medical: Not on file    Non-medical: Not on file  Tobacco Use  . Smoking status: Current Every Day Smoker    Packs/day: 1.00    Years: 40.00    Pack years: 40.00  . Smokeless tobacco: Never Used  Substance and Sexual Activity  . Alcohol use: No  . Drug use: No  . Sexual activity: Not on file  Lifestyle  . Physical activity:    Days per week: Not on file    Minutes per session: Not on file  . Stress: Not on file  Relationships  . Social connections:  Talks on phone: Not on file    Gets together: Not on file    Attends religious service: Not on file    Active member of club or organization: Not on file    Attends meetings of clubs or organizations: Not on file    Relationship status: Not on file  . Intimate partner violence:    Fear of current or ex partner: Not on file    Emotionally abused: Not on file    Physically abused: Not on file    Forced sexual activity: Not on file  Other Topics Concern  . Not on file  Social History Narrative  . Not on file     Family History  Problem Relation Age of Onset  . Coronary artery disease Father 6       Fatal myocardial infarction    Review of Systems: All other systems reviewed and are otherwise negative except as noted above.  Labs:   Lab Results  Component Value Date   WBC 10.0 12/25/2018   HGB 15.5 12/25/2018   HCT 44.2  12/25/2018   MCV 99.1 12/25/2018   PLT 176 12/25/2018    Recent Labs  Lab 12/25/18 2049  NA 132*  K 4.1  CL 93*  CO2 26  BUN 15  CREATININE 1.41*  CALCIUM 9.3  GLUCOSE 98   Recent Labs    12/25/18 2049  TROPONINI 8.54*   Lab Results  Component Value Date   CHOL 196 02/19/2016   HDL 47 02/19/2016   LDLCALC 129 (H) 02/19/2016   TRIG 100 02/19/2016   Lab Results  Component Value Date   DDIMER 0.62 (H) 12/25/2018    Radiology/Studies:  Dg Chest 2 View  Result Date: 12/25/2018 CLINICAL DATA:  Chest pain for 4 days. EXAM: CHEST - 2 VIEW COMPARISON:  PA and lateral chest 02/17/2016. FINDINGS: Lungs clear. Heart size normal. No pneumothorax or pleural effusion. No acute or focal bony abnormality. IMPRESSION: No acute disease. Electronically Signed   By: Drusilla Kanner M.D.   On: 12/25/2018 20:49   Wt Readings from Last 3 Encounters:  12/25/18 82.1 kg  02/18/16 88 kg  03/13/13 88 kg    EKG: NSR, nonspecific ST-TW changes.  Physical Exam: Blood pressure 118/69, pulse 91, temperature 99 F (37.2 C), temperature source Oral, resp. rate (!) 22, height  (1.676 m), weight 82.1 kg, SpO2 97 %. Body mass index is 29.21 kg/m. General: Well developed, well nourished, in no acute distress. Head: Normocephalic, atraumatic, sclera non-icteric, no xanthomas, nares are without discharge.  Neck: Negative for carotid bruits. JVD not elevated. Lungs: Clear bilaterally to auscultation without wheezes, rales, or rhonchi. Breathing is unlabored. Heart: RRR with S1 S2. No murmurs, rubs, or gallops appreciated. Abdomen: Soft, non-tender, non-distended with normoactive bowel sounds. No hepatomegaly. No rebound/guarding. No obvious abdominal masses. Msk:  Strength and tone appear normal for age. Extremities: No clubbing or cyanosis. No edema.  Distal pedal pulses are 2+ and equal bilaterally. Neuro: Alert and oriented X 3. No focal deficit. No facial asymmetry. Moves all extremities  spontaneously. Psych:  Responds to questions appropriately with a normal affect.    Assessment and Plan  75M with prior stroke and active smoking, who p/w CP, found to have NSTEMI.  1.  NSTEMI: Currently CP free.  Continue IV heparin, anticipate cardiac catheterization in AM.  Continuing home Plavix for now, would likely consider switching to ticagrelor.  TTE ordered.  2. HTN:  Continue home amlodipine.  3.  HLD: Continue home atorvastatin 40, check AM lipid panel, consider uptitrating as needed.  4.  Smoking: Ordered nicotine patch; consider further pharmacologic therapies on d/c.   Signed, Esmond Plants, MD 12/26/2018, 12:36 AM

## 2018-12-26 NOTE — H&P (View-Only) (Signed)
 Progress Note  Patient Name: Michael Cortez Date of Encounter: 12/26/2018  Primary Cardiologist: New to [facility]  Subjective   No chest pain.  Had severe GERD 3 days ago.  Inpatient Medications    Scheduled Meds: . amLODipine  5 mg Oral Daily  . [START ON 12/27/2018] aspirin EC  81 mg Oral Daily  . atorvastatin  40 mg Oral q1800  . clopidogrel  75 mg Oral Daily  . nicotine  21 mg Transdermal Daily  . sodium chloride flush  3 mL Intravenous Once   Continuous Infusions: . heparin 1,000 Units/hr (12/25/18 2301)   PRN Meds: acetaminophen, nitroGLYCERIN, ondansetron (ZOFRAN) IV   Vital Signs    Vitals:   12/26/18 0100 12/26/18 0130 12/26/18 0303 12/26/18 0542  BP: 122/65 127/65 109/67 (!) 107/56  Pulse: 80 84 97 91  Resp: 20 18 12 14  Temp:   98.7 F (37.1 C) 98.6 F (37 C)  TempSrc:   Oral Oral  SpO2: 95% 95% 98% 98%  Weight:   81.4 kg   Height:   5' 6" (1.676 m)     Intake/Output Summary (Last 24 hours) at 12/26/2018 0745 Last data filed at 12/26/2018 0303 Gross per 24 hour  Intake 10 ml  Output 1100 ml  Net -1090 ml   Last 3 Weights 12/26/2018 12/25/2018 02/18/2016  Weight (lbs) 179 lb 6.4 oz 181 lb 194 lb  Weight (kg) 81.375 kg 82.101 kg 87.998 kg      Telemetry    NSR - Personally Reviewed  ECG    NSR, NSST - Personally Reviewed  Physical Exam   GEN: No acute distress.   Neck: No JVD Cardiac: RRR, no murmurs, rubs, or gallops.  Respiratory: Clear to auscultation bilaterally. GI: Soft, nontender, non-distended  MS: No edema; No deformity. Neuro:  Nonfocal  Psych: Normal affect   Labs    Chemistry Recent Labs  Lab 12/25/18 2049  NA 132*  K 4.1  CL 93*  CO2 26  GLUCOSE 98  BUN 15  CREATININE 1.41*  CALCIUM 9.3  GFRNONAA 53*  GFRAA >60  ANIONGAP 13     Hematology Recent Labs  Lab 12/25/18 2049  WBC 10.0  RBC 4.46  HGB 15.5  HCT 44.2  MCV 99.1  MCH 34.8*  MCHC 35.1  RDW 12.0  PLT 176    Cardiac Enzymes Recent Labs   Lab 12/25/18 2049  TROPONINI 8.54*     DDimer  Recent Labs  Lab 12/25/18 2049  DDIMER 0.62*     Radiology    Dg Chest 2 View  Result Date: 12/25/2018 CLINICAL DATA:  Chest pain for 4 days. EXAM: CHEST - 2 VIEW COMPARISON:  PA and lateral chest 02/17/2016. FINDINGS: Lungs clear. Heart size normal. No pneumothorax or pleural effusion. No acute or focal bony abnormality. IMPRESSION: No acute disease. Electronically Signed   By: Thomas  Dalessio M.D.   On: 12/25/2018 20:49    Cardiac Studies   None  Patient Profile     63 y.o. male  with history of stroke (on Plavix), HLD, HTN and active smoking who presents chest pain and palpitations.  ECG showed NSR with NS ST-TW changes.  Labs were notable for a troponin of 8.54 and Cr 1.4.  COVID PCR screen was negative.  He was started on IV heparin and admitted to the [facility] cardiology service for further management.   Assessment & Plan    1. NSTEMI - Troponin 8.54 on admit. Will cycle enzyme.   EKG with non specific ST/T wave changes. Chest pain free. Treated with IV heparin.Likely cath today.   2. HTN - BP stable on home amlodipine.   3. HLD - On lipitor 40mg . Check lipid panel.   4. Tobacco smoking - Advised cessation. On nicotine patch.   5. AKI - Unknown baseline. Follow closely.  6. Palpitations - Follow on telemetry  7. Hx of Stroke - On Plavix  For questions or updates, please contact [facility] Please consult [email or web address] for contact info under        Signed, Manson Passey, PA  12/26/2018, 7:45 AM    I have examined the patient and reviewed assessment and plan and discussed with patient.  Agree with above as stated.    Cardiac catheterization was discussed with the patient fully. The patient understands that risks include but are not limited to stroke (1 in 1000), death (1 in 1000), kidney failure [usually temporary] (1 in 500), bleeding (1 in 200), allergic reaction [possibly serious] (1 in 200).  The  patient understands and is willing to proceed.    He is agreeable to cath.  IV hydration given mild CRI.   Lance Muss

## 2018-12-27 ENCOUNTER — Other Ambulatory Visit: Payer: Self-pay | Admitting: Student

## 2018-12-27 DIAGNOSIS — I10 Essential (primary) hypertension: Secondary | ICD-10-CM

## 2018-12-27 DIAGNOSIS — E782 Mixed hyperlipidemia: Secondary | ICD-10-CM

## 2018-12-27 DIAGNOSIS — N289 Disorder of kidney and ureter, unspecified: Secondary | ICD-10-CM

## 2018-12-27 LAB — HIV ANTIBODY (ROUTINE TESTING W REFLEX): HIV Screen 4th Generation wRfx: NONREACTIVE

## 2018-12-27 LAB — BASIC METABOLIC PANEL
Anion gap: 9 (ref 5–15)
BUN: 15 mg/dL (ref 8–23)
CO2: 21 mmol/L — ABNORMAL LOW (ref 22–32)
Calcium: 8.5 mg/dL — ABNORMAL LOW (ref 8.9–10.3)
Chloride: 105 mmol/L (ref 98–111)
Creatinine, Ser: 1.3 mg/dL — ABNORMAL HIGH (ref 0.61–1.24)
GFR calc Af Amer: >60 mL/min (ref >60)
GFR calc non Af Amer: 58 mL/min — ABNORMAL LOW (ref >60)
Glucose, Bld: 102 mg/dL — ABNORMAL HIGH (ref 70–99)
Potassium: 4 mmol/L (ref 3.5–5.1)
Sodium: 135 mmol/L (ref 135–145)

## 2018-12-27 LAB — CBC
HCT: 40.2 % (ref 39.0–52.0)
Hemoglobin: 14.3 g/dL (ref 13.0–17.0)
MCH: 34.6 pg — ABNORMAL HIGH (ref 26.0–34.0)
MCHC: 35.6 g/dL (ref 30.0–36.0)
MCV: 97.3 fL (ref 80.0–100.0)
Platelets: 189 10*3/uL (ref 150–400)
RBC: 4.13 MIL/uL — ABNORMAL LOW (ref 4.22–5.81)
RDW: 11.9 % (ref 11.5–15.5)
WBC: 7 10*3/uL (ref 4.0–10.5)
nRBC: 0 % (ref 0.0–0.2)

## 2018-12-27 MED ORDER — LOSARTAN POTASSIUM 50 MG PO TABS: 50.0000 mg | ORAL_TABLET | Freq: Every day | ORAL | Status: DC

## 2018-12-27 MED ORDER — ATORVASTATIN CALCIUM 80 MG PO TABS: 80.0000 mg | ORAL_TABLET | Freq: Every day | ORAL | 2 refills | Status: DC

## 2018-12-27 MED ORDER — LOSARTAN POTASSIUM 50 MG PO TABS: 50.0000 mg | ORAL_TABLET | Freq: Every day | ORAL | 2 refills | Status: DC

## 2018-12-27 MED ORDER — METOPROLOL SUCCINATE ER 25 MG PO TB24: 12.5000 mg | ORAL_TABLET | Freq: Every day | ORAL | 3 refills | Status: DC

## 2018-12-27 MED ORDER — NITROGLYCERIN 0.4 MG SL SUBL: 0.4000 mg | SUBLINGUAL_TABLET | SUBLINGUAL | 5 refills | Status: AC | PRN

## 2018-12-27 MED ORDER — ASPIRIN 81 MG PO TBEC: 81.0000 mg | DELAYED_RELEASE_TABLET | Freq: Every day | ORAL | Status: DC

## 2018-12-27 NOTE — Discharge Instructions (Signed)
Aspirin and Your Heart  Aspirin is a medicine that prevents the cells in the blood that are used for clotting, called platelets, from sticking together. Aspirin can be used to help reduce the risk of blood clots, heart attacks, and other heart-related problems. Can I take aspirin? Your health care provider will help you determine whether it is safe and beneficial for you to take aspirin daily. Taking aspirin daily may be helpful if you:  Have had a heart attack or chest pain.  Are at risk for a heart attack.  Have undergone open-heart surgery, such as coronary artery bypass surgery (CABG).  Have had coronary angioplasty or a stent.  Have had certain types of stroke or transient ischemic attack (TIA).  Have peripheral artery disease (PAD).  Have chronic heart rhythm problems such as atrial fibrillation and cannot take an anticoagulant.  Have valve disease or have had surgery on a valve. What are the risks? Daily use of aspirin can cause side effects. Some of these include:  Bleeding. Bleeding problems can be minor or serious. An example of a minor problem is a cut that does not stop bleeding. An example of a more serious problem is stomach bleeding or, rarely, bleeding into the brain. Your risk of bleeding is increased if you are also taking non-steroidal anti-inflammatory drugs (NSAIDs).  Increased bruising.  Upset stomach.  An allergic reaction. People who have nasal polyps have an increased risk of developing an aspirin allergy. General guidelines  Take aspirin only as told by your health care provider. Make sure that you understand how much you should take and what form you should take. The two forms of aspirin are: ? Non-enteric-coated.This type of aspirin does not have a coating and is absorbed quickly. This type of aspirin also comes in a chewable form. ? Enteric-coated. This type of aspirin has a coating that releases the medicine very slowly. Enteric-coated aspirin  might cause less stomach upset than non-enteric-coated aspirin. This type of aspirin should not be chewed or crushed.  Limit alcohol intake to no more than 1 drink a day for nonpregnant women and 2 drinks a day for men. Drinking alcohol increases your risk of bleeding. One drink equals 12 oz of beer, 5 oz of wine, or 1 oz of hard liquor. Contact a health care provider if you:  Have unusual bleeding or bruising.  Have stomach pain or nausea.  Have ringing in your ears.  Have an allergic reaction that causes: ? Hives. ? Itchy skin. ? Swelling of the lips, tongue, or face. Get help right away if you:  Notice that your bowel movements are bloody, dark red, or black in color.  Vomit or cough up blood.  Have blood in your urine.  Cough, have noisy breathing (wheeze), or feel short of breath.  Have chest pain, especially if the pain spreads to the arms, back, neck, or jaw.  Have a severe headache, or a headache with confusion, or dizziness. These symptoms may represent a serious problem that is an emergency. Do not wait to see if the symptoms will go away. Get medical help right away. Call your local emergency services (911 in the [place] Do not drive yourself to the hospital. Summary  Aspirin can be used to help reduce the risk of blood clots, heart attacks, and other heart-related problems.  Daily use of aspirin can increase your risk of side effects. Your health care provider will help you determine whether it is safe and beneficial for you  to take aspirin daily.  Take aspirin only as told by your health care provider. Make sure that you understand how much you can take and what form you can take. This information is not intended to replace advice given to you by your health care provider. Make sure you discuss any questions you have with your health care provider. Document Released: 07/12/2008 Document Revised: 05/30/2017 Document Reviewed: 05/30/2017 Elsevier Interactive Patient  Education  2019 [employer]   Coronary Artery Disease, Male  Coronary artery disease (CAD) is a condition in which the arteries that lead to the heart (coronary arteries) become narrow or blocked. The narrowing or blockage can lead to decreased blood flow to the heart. Prolonged reduced blood flow can cause a heart attack (myocardial infarction or MI). This condition may also be called coronary heart disease. Because CAD is the leading cause of death in men, it is important to understand what causes this condition and how it is treated. What are the causes? CAD is most often caused by atherosclerosis. This is the buildup of fat and cholesterol (plaque) on the inside of the arteries. Over time, the plaque may narrow or block the artery, reducing blood flow to the heart. Plaque can also become weak and break off within a coronary artery and cause a sudden blockage. Other less common causes of CAD include:  An embolism or blood clot in a coronary artery.  A tearing of the artery (spontaneous coronary artery dissection).  An aneurysm.  Inflammation (vasculitis) in the artery wall. What increases the risk? The following factors may make you more likely to develop this condition:  Age. Men over age 25 are at a greater risk of CAD.  Family history of CAD.  Gender. Men often develop CAD earlier in life than women.  High blood pressure (hypertension).  Diabetes.  High cholesterol levels.  Tobacco use.  Excessive alcohol use.  Lack of exercise.  A diet high in saturated and trans fats, such as fried food and processed meat. Other possible risk factors include:  High stress levels.  Depression.  Obesity.  Sleep apnea. What are the signs or symptoms? Many people do not have any symptoms during the early stages of CAD. As the condition progresses, symptoms may include:  Chest pain (angina). The pain can: ? Feel like a crushing or squeezing, or a tightness, pressure,  fullness, or heaviness in the chest. ? Last more than a few minutes or can stop and recur. The pain tends to get worse with exercise or stress and to fade with rest.  Pain in the arms, neck, jaw, or back.  Unexplained heartburn or indigestion.  Shortness of breath.  Nausea or vomiting.  Sudden light-headedness.  Sudden cold sweats.  Fluttering or fast heartbeat (palpitations). How is this diagnosed? This condition is diagnosed based on:  Your family and medical history.  A physical exam.  Tests, including: ? A test to check the electrical signals in your heart (electrocardiogram). ? Exercise stress test. This looks for signs of blockage when the heart is stressed with exercise, such as running on a treadmill. ? Pharmacologic stress test. This test looks for signs of blockage when the heart is being stressed with a medicine. ? Blood tests. ? Coronary angiogram. This is a procedure to look at the coronary arteries to see if there is any blockage. During this test, a dye is injected into your arteries so they appear on an X-ray. ? A test that uses sound waves to take  a picture of your heart (echocardiogram). ? Chest X-ray. How is this treated? This condition may be treated by:  Healthy lifestyle changes to reduce risk factors.  Medicines such as: ? Antiplatelet medicines and blood-thinning medicines, such as aspirin. These help to prevent blood clots. ? Nitroglycerin. ? Blood pressure medicines. ? Cholesterol-lowering medicine.  Coronary angioplasty and stenting. During this procedure, a thin, flexible tube is inserted through a blood vessel and into a blocked artery. A balloon or similar device on the end of the tube is inflated to open up the artery. In some cases, a small, mesh tube (stent) is inserted into the artery to keep it open.  Coronary artery bypass surgery. During this surgery, veins or arteries from other parts of the body are used to create a bypass around the  blockage and allow blood to reach your heart. Follow these instructions at home: Medicines  Take over-the-counter and prescription medicines only as told by your health care provider.  Do not take the following medicines unless your health care provider approves: ? NSAIDs, such as ibuprofen, naproxen, or celecoxib. ? Vitamin supplements that contain vitamin A, vitamin E, or both. Lifestyle  Follow an exercise program approved by your health care provider. Aim for 150 minutes of moderate exercise or 75 minutes of vigorous exercise each week.  Maintain a healthy weight or lose weight as approved by your health care provider.  Rest when you are tired.  Learn to manage stress or try to limit your stress. Ask your health care provider for suggestions if you need help.  Get screened for depression and seek treatment, if needed.  Do not use any products that contain nicotine or tobacco, such as cigarettes and e-cigarettes. If you need help quitting, ask your health care provider.  Do not use illegal drugs. Eating and drinking  Follow a heart-healthy diet. A dietitian can help educate you about healthy food options and changes. In general, eat plenty of fruits and vegetables, lean meats, and whole grains.  Avoid foods high in: ? Sugar. ? Salt (sodium). ? Saturated fat, such as processed or fatty meat. ? Trans fat, such as fried foods.  Use healthy cooking methods such as roasting, grilling, broiling, baking, poaching, steaming, or stir-frying.  If you drink alcohol, and your health care provider approves, limit your alcohol intake to no more than 2 drinks per day. One drink equals 12 ounces of beer, 5 ounces of wine, or 1 ounces of hard liquor. General instructions  Manage any other health conditions, such as hypertension and diabetes. These conditions affect your heart.  Your health care provider may ask you to monitor your blood pressure. Ideally, your blood pressure should be  below 130/80.  Keep all follow-up visits as told by your health care provider. This is important. Get help right away if:  You have pain in your chest, neck, arm, jaw, stomach, or back that: ? Lasts more than a few minutes. ? Is recurring. ? Is not relieved by taking medicine under your tongue (sublingualnitroglycerin).  You have too much (profuse) sweating without cause.  You have unexplained: ? Heartburn or indigestion. ? Shortness of breath or difficulty breathing. ? Fluttering or fast heartbeat (palpitations). ? Nausea or vomiting. ? Fatigue. ? Feelings of nervousness or anxiety. ? Weakness. ? Diarrhea.  You have sudden light-headedness or dizziness.  You faint.  You feel like hurting yourself or think about taking your own life. These symptoms may represent a serious problem that is an emergency.  Do not wait to see if the symptoms will go away. Get medical help right away. Call your local emergency services (911 in the [place] Do not drive yourself to the hospital. Summary  Coronary artery disease (CAD) is a process in which the arteries that lead to the heart (coronary arteries) become narrow or blocked. The narrowing or blockage can lead to a heart attack.  Many people do not have any symptoms during the early stages of CAD. This is called "silent CAD."  CAD can be treated with lifestyle changes, medicines, surgery, or a combination of these treatments. This information is not intended to replace advice given to you by your health care provider. Make sure you discuss any questions you have with your health care provider. Document Released: 02/24/2014 Document Revised: 07/20/2016 Document Reviewed: 07/20/2016 Elsevier Interactive Patient Education  2019 [employer]   Heart Attack A heart attack (myocardial infarction, MI) is when blood and oxygen supply to the heart is cut off. A heart attack causes damage to the heart that cannot be fixed. If you think you are having  a heart attack, do not wait to see if the symptoms will go away. Get medical help right away. What are the signs or symptoms? Symptoms of this condition include:  Chest pain. It may feel like: ? Crushing or squeezing. ? Tightness, pressure, fullness, or heaviness.  Pain in the arm, neck, jaw, back, or upper body.  Shortness of breath.  Heartburn.  Indigestion.  Nausea.  Cold sweats.  Feeling tired.  Sudden lightheadedness. Follow these instructions at home: Medicines  Take over-the-counter and prescription medicines only as told by your doctor. You may need to take medicine: ? To keep your blood from clotting too easily. ? To control blood pressure. ? To lower cholesterol. ? To control heart rhythms.  Do not take these medicines unless your doctor says it is okay: ? NSAIDs. ? Supplements that have vitamin A, vitamin E, or both. ? Hormone replacement therapy that has estrogen with or without progestin. Lifestyle   Do not use any products that have nicotine or tobacco. This includes cigarettes and e-cigarettes. If you need help quitting, ask your doctor.  Avoid secondhand smoke.  Exercise regularly. Ask your doctor about a cardiac rehab program.  Eat heart-healthy foods. A diet and nutrition specialist (registered dietitian) can help you form healthy eating habits.  Stay at a healthy weight.  Lower your stress level.  Do not use illegal drugs. Alcohol use  Do not drink alcohol if: ? Your doctor tells you not to drink. ? You are pregnant, may be pregnant, or are planning to become pregnant.  If you drink alcohol, limit how much you have: ? 0-1 drink a day for women. ? 0-2 drinks a day for men.  Know how much alcohol is in your drink. In the [place] one drink equals one typical bottle of beer (12 oz), one-half glass of wine (5 oz), or one shot of hard liquor (1 oz). General instructions  Work with your doctor to treat other problems you have, like  diabetes.  Get screened for depression, and get treatment if needed.  Keep your vaccinations up to date. Get the flu shot (influenza vaccination) every year.  Keep all follow-up visits as told by your doctor. This is important. Contact a doctor if:  You feel sad.  You have trouble doing your daily activities. Get help right away if you:  Have sudden, unexplained discomfort in your: ? Chest. ? Arms. ?  Back. ? Neck. ? Jaw. ? Upper body.  Have shortness of breath.  Have sudden sweating or clammy skin.  Feel sick to your stomach (nauseous).  Throw up (vomit).  Suddenly get lightheaded or dizzy.  Feel your heart beating fast.  Feel your heart skipping beats.  Have blood pressure that is higher than 180/120. These symptoms may be an emergency. Do not wait to see if the symptoms will go away. Get medical help right away. Call your local emergency services (911 in the [place] Do not drive yourself to the hospital. Summary  A heart attack is when blood and oxygen supply to the heart is cut off.  You should not take NSAIDs unless your doctor says it is okay.  Do not smoke. Avoid secondhand smoke.  Exercise regularly. Ask your doctor about a cardiac rehab program. This information is not intended to replace advice given to you by your health care provider. Make sure you discuss any questions you have with your health care provider. Document Released: 01/29/2012 Document Revised: 04/23/2018 Document Reviewed: 09/13/2017 Elsevier Interactive Patient Education  2019 [employer] Post NSTEMI: NO HEAVY LIFTING X 2 WEEKS. NO SEXUAL ACTIVITY X 2 WEEKS. NO DRIVING X 1 WEEK. NO SOAKING BATHS, HOT TUBS, POOLS, ETC., X 7 DAYS.  Radial Site Care: Refer to this sheet in the next few weeks. These instructions provide you with information on caring for yourself after your procedure. Your caregiver may also give you more specific instructions. Your treatment has been planned according  to current medical practices, but problems sometimes occur. Call your caregiver if you have any problems or questions after your procedure. HOME CARE INSTRUCTIONS  You may shower the day after the procedure.Remove the bandage (dressing) and gently wash the site with plain soap and water.Gently pat the site dry.   Do not apply powder or lotion to the site.   Do not submerge the affected site in water for 3 to 5 days.   Inspect the site at least twice daily.   Do not flex or bend the affected arm for 24 hours.   No lifting over 5 pounds (2.3 kg) for 5 days after your procedure.   Do not drive home if you are discharged the same day of the procedure. Have someone else drive you.  What to expect:  Any bruising will usually fade within 1 to 2 weeks.   Blood that collects in the tissue (hematoma) may be painful to the touch. It should usually decrease in size and tenderness within 1 to 2 weeks.  SEEK IMMEDIATE MEDICAL CARE IF:  You have unusual pain at the radial site.   You have redness, warmth, swelling, or pain at the radial site.   You have drainage (other than a small amount of blood on the dressing).   You have chills.   You have a fever or persistent symptoms for more than 72 hours.   You have a fever and your symptoms suddenly get worse.   Your arm becomes pale, cool, tingly, or numb.   You have heavy bleeding from the site. Hold pressure on the site.

## 2018-12-27 NOTE — Discharge Summary (Signed)
Discharge Summary    Patient ID: Michael Cortez MRN: [id number]; DOB: [birth year]  Admit date: 12/25/2018 Discharge date: 12/27/2018  Primary Care Provider: Wilson Singer, MD  Primary Cardiologist: Lance Muss, MD  Primary Electrophysiologist:  None   Discharge Diagnoses    Principal Problem:   NSTEMI (non-ST elevated myocardial infarction) [facility]) Active Problems:   Tobacco abuse   Hyperlipidemia   Essential hypertension   Renal insufficiency   Allergies No Known Allergies  Diagnostic Studies/Procedures    Echocardiogram 12/26/2018:  Impressions:  1. The left ventricle has mildly reduced systolic function, with an ejection fraction of 45-50%. The cavity size was normal. Left ventricular diastolic Doppler parameters are consistent with impaired relaxation. Indeterminate filling pressures The E/e'  is 8-15.  2. Moderate hypokinesis of the left ventricular, entire inferior wall and inferolateral wall.  3. The right ventricle has normal systolic function. The cavity was normal. There is no increase in right ventricular wall thickness.  4. The mitral valve is abnormal. Mild thickening of the mitral valve leaflet.  5. The tricuspid valve is grossly normal.  6. The aortic valve was not well visualized. Mild sclerosis of the aortic valve.    Summary:  Technically difficult study. Definity contrast used. LVEF 45-50%, normal LV wall thickness, inferior and inferolateral hypokinesis, grade 1 DD, indeterminate LV filling pressure.  _______________  Left Cardiac Catheterization 12/26/2018:   Mid RCA lesion is 40% stenosed.   Ost 2nd Mrg to 2nd Mrg lesion is 90% stenosed.   Ost LAD to Prox LAD lesion is 40% stenosed.   Ost LM lesion is 30% stenosed.   Post intervention, there is a 0% residual stenosis.   The left ventricular systolic function is normal.   The left ventricular ejection fraction is 50-55% by visual estimate.   LV end diastolic pressure is  normal.   A stent was successfully placed.    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    There is global LV contractility with a suggestion of subtle mid distal focal hypocontractility in the anterolateral wall. LVEDP 10 mm.    Successful PCI to the OM 2 vessel with ultimate insertion of a 2.25 x 18 mm Resolute Onyx stent postdilated to 2.3 mm with the stenosis being reduced to 0% and brisk TIMI-3 flow.    Recommendation: Continue DAPT therapy with aspirin/Plavix for minimum of 1 year and possibly indefinitely particularly with his prior stroke history. Smoking cessation is imperative. Medical therapy for concomitant CAD. Aggressive lipid-lowering therapy with target LDL less than 70. Anticipate discharge in a.m.   History of Present Illness     Michael Cortez is a 63 year old male with a history of stroke on Plavix, hyperlipidemia, and continued tobacco use who presented to ED on 12/25/2018 for chest pain and palpitaitons. Patient had a severe episode of chest pain approximately 4 days prior to presentation that lasted about an hour. He spoke with PCP and his dose of Omeprazole was empirically increased from 20 mg daily to 40 mg daily. Patient reports no recurrence of chest pain since then. However, earlier on day of presentation, patient felt onset of palpitations and measured his measured his heart rate to be 115 bpm on a home monitor. He was instructed by his  PCP to present to the ED for evaluation.   In the ED, vital signs were within normal limits. ECG showed normal sinus rhythm with non-specific ST/T changes. Labs were notable for a troponin of 8.54 and serum creatinine 1.4. COVID PCR screen was negative. He was started on IV  Heparin and admitted to the Cardiology service for further management. He remained chest pain free throughout his ED stay.   Hospital Course     Consultants: None   NSTEMI  Patient was admitted on 12/25/2018 for NSTEMI as stated above. Troponin elevated at 8.54 >> 7.88 >> 8.20 >> 11.60. D-dimer elevated at 0.62; however, symptoms felt to be secondary to ACS rather than pulmonary embolism. Patient was taken for cardiac catheterization which showed multivessel CAD with 40% stenosis of the mid RCA, 40% stenosis of the ostial to proximal LAD, and 90% stenosis of OM2. Patient underwent successful PCI with DES to OM2 lesion. Echocardiogram showed LVEF of 45 to 50% with moderate hypokinesis of the entire inferior wall and inferolateral wall as well as grade 1 diastolic dysfunction. Patient tolerated the procedure well. Renal function stable. - Continue dual antiplatelet therapy with Aspirin and Plavix.  - Continue aggressive secondary prevention with Toprol-XL 12.5mg  and Lipitor 80mg  daily. Will also add Losartan due to reduced EF.  Hypertension  BP well controlled. Most recent BP 130/66. - Patient on Amlodipine 5mg  daily at home. However, will switch to Losartan 50mg  discharge.  Hyperlipidemia  Lipid panel this admission: Cholesterol 173, Triglycerides 111, HDL 53, LDL 98.  - Continue Lipitor 80mg  daily (started this admission).  - Will need repeat lipid panel and CMP in 6 weeks.   AKI  Serum creatinine 1.41 on admission. Baseline unknown. Improved to 1.30 today. Will recheck BMET next week.   Tobacco Use  - Complete cessation advised. Patient placed on Nicotine patch in hospital; however, he would like to try the Nicotine gum. Patient said he would get this over-the-counter.  Patient seen and examined by Dr. Rennis GoldenHilty and determined to be stable for discharge. [place] arrange outpatient follow[place] in [place]. Medications as below.  _____________  Discharge Vitals Blood pressure 130/66, pulse 85,  temperature 98.7 F (37.1 C), temperature source Oral, resp. rate 17, height 5\' 6"  (1.676 m), weight 81 kg, SpO2 96 %.  Filed Weights   12/25/18 1925 12/26/18 0303 12/27/18 0443  Weight: 82.1 kg 81.4 kg 81 kg    Labs & Radiologic Studies    CBC Recent Labs    12/26/18 0944 12/27/18 0338  WBC 9.1 7.0  HGB 16.7 14.3  HCT 46.5 40.2  MCV 97.1 97.3  PLT 215 189   Basic Metabolic Panel Recent Labs    16/05/9604/15/20 0944 12/27/18 0338  NA 135 135  K 4.8 4.0  CL 98 105  CO2 24 21*  GLUCOSE 96 102*  BUN 16 15  CREATININE 1.49* 1.30*  CALCIUM 9.5 8.5*   Liver Function Tests No results for input(s): AST, ALT, ALKPHOS, BILITOT, PROT, ALBUMIN in the last 72 hours. No results for input(s): LIPASE, AMYLASE in the last 72 hours. Cardiac Enzymes Recent Labs    12/26/18 0825 12/26/18 1433 12/26/18 2032  TROPONINI 7.88* 8.20* 11.60*   BNP Invalid input(s): POCBNP D-Dimer Recent Labs    12/25/18 2049  DDIMER 0.62*   Hemoglobin A1C No results for input(s): HGBA1C in the last 72 hours. Fasting Lipid Panel Recent Labs    12/26/18 0825  CHOL 173  HDL 53  LDLCALC 98  TRIG 111  CHOLHDL 3.3   Thyroid Function Tests No results for input(s): TSH, T4TOTAL, T3FREE, THYROIDAB in the last 72 hours.  Invalid input(s): FREET3 _____________  Dg Chest 2 View  Result Date: 12/25/2018 CLINICAL DATA:  Chest pain for 4 days. EXAM: CHEST - 2 VIEW COMPARISON:  PA and lateral chest 02/17/2016. FINDINGS: Lungs clear. Heart size normal. No pneumothorax or pleural effusion. No acute or focal bony abnormality. IMPRESSION: No acute disease. Electronically Signed   By: Drusilla Kanner M.D.   On: 12/25/2018 20:49   Disposition   Patient is being discharged home today in good condition.  Follow-up Plans & Appointments    Follow-up Information    [facility] Follow up.   Why:  Please go to the [facility] on Wednesday 12/31/2018 for a blood draw so that we can recheck your  kidney function. Contact information: [address] [place] [phone]       Rollene Rotunda, MD Follow up.   Specialty:  Cardiology Why:  Our office will call you early next week to schedule you a follow-up appointment within the next 2 weeks. If you do not hear from [place] by Tuesday 12/30/2018, please call our office. Contact information: [address] [place] [address] [phone]          Discharge Instructions    Amb Referral to Cardiac Rehabilitation   Complete by:  As directed    Diagnosis:   Coronary Stents NSTEMI     After initial evaluation and assessments completed: Virtual Based Care may be provided alone or in conjunction with Phase 2 Cardiac Rehab based on patient barriers.:  Yes   Diet - low sodium heart healthy   Complete by:  As directed    Increase activity slowly   Complete by:  As directed       Discharge Medications   Allergies as of 12/27/2018   No Known Allergies     Medication List    STOP taking these medications   amLODipine 5 MG tablet Commonly known as:  NORVASC   polyethylene glycol-electrolytes 420 g solution Commonly known as:  TriLyte     TAKE these medications   aspirin 81 MG EC tablet Take 1 tablet (81 mg total) by mouth daily. Start taking on:  Dec 28, 2018   atorvastatin 80 MG tablet Commonly known as:  LIPITOR Take 1 tablet (80 mg total) by mouth daily at 6 PM.   clopidogrel 75 MG tablet Commonly known as:  PLAVIX Take 1 tablet (75 mg total) by mouth daily.   losartan 50 MG tablet Commonly known as:  COZAAR Take 1 tablet (50 mg total) by mouth daily. Start taking on:  Dec 28, 2018   metoprolol succinate 25 MG 24 hr tablet Commonly known as:  Toprol XL Take 0.5 tablets (12.5 mg total) by mouth daily.   nitroGLYCERIN 0.4 MG SL tablet Commonly known as:  NITROSTAT Place 1 tablet (0.4 mg total) under the tongue every 5 (five) minutes x 3 doses as needed for chest pain.    pantoprazole 40 MG tablet Commonly known as:  PROTONIX Take 40 mg by mouth 2 (two) times daily.   testosterone cypionate 200 MG/ML injection Commonly known as:  DEPOTESTOSTERONE CYPIONATE Inject 0.5 mLs into the muscle once a week. Wednesday   Vitamin D 125 MCG (5000 UT) Caps Take 5,000 Units by mouth daily.        Acute coronary syndrome (MI, NSTEMI, STEMI, etc) this  admission?: Yes.     AHA/ACC Clinical Performance & Quality Measures: 1. Aspirin prescribed? - Yes 2. ADP Receptor Inhibitor (Plavix/Clopidogrel, Brilinta/Ticagrelor or Effient/Prasugrel) prescribed (includes medically managed patients)? - Yes 3. Beta Blocker prescribed? - Yes 4. High Intensity Statin (Lipitor 40-80mg  or Crestor 20-40mg ) prescribed? - Yes 5. EF assessed during THIS hospitalization? - Yes 6. For EF <40%, was ACEI/ARB prescribed? - Yes 7. For EF <40%, Aldosterone Antagonist (Spironolactone or Eplerenone) prescribed? - Not Applicable (EF >/= 40%) 8. Cardiac Rehab Phase II ordered (Included Medically managed Patients)? - Yes     Outstanding Labs/Studies   Repeat BMET in 1 week.  Repeat lipid panel and CMP in 6 weeks after starting high-intensity statin.  Duration of Discharge Encounter   Greater than 30 minutes including physician time.  Signed, Corrin Parker, PA-C 12/27/2018, 1:37 PM

## 2018-12-27 NOTE — Progress Notes (Signed)
DAILY PROGRESS NOTE   Patient Name: Michael Cortez Date of Encounter: 12/27/2018 Cardiologist: Lance Muss, MD  Chief Complaint   No further chest pain  Patient Profile   [phone] with prior stroke and active smoking, who p/w CP, found to have NSTEMI.  Subjective   No further chest pain overnight. 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   Objective   Vitals:   12/26/18 1421 12/26/18 2113 12/27/18 0432 12/27/18 0443  BP: 123/71 114/65 130/66   Pulse: 85 97 85   Resp: 17     Temp: 97.8 F (36.6 C) 98.9 F (37.2 C) 98.7 F (37.1 C)   TempSrc: Oral Oral Oral   SpO2: 98% 95% 96%   Weight:    81 kg  Height:        Intake/Output Summary (Last 24 hours) at 12/27/2018 1208 Last data filed at 12/27/2018 1016 Gross per 24 hour  Intake 1963 ml  Output 800 ml  Net 1163 ml   Filed Weights   12/25/18 1925 12/26/18 0303 12/27/18 0443  Weight: 82.1 kg 81.4 kg 81 kg    Physical Exam   General appearance: alert and no distress Lungs: clear to auscultation bilaterally Heart: regular rate and rhythm, S1, S2 normal, no murmur, click, rub or gallop Extremities: extremities normal, atraumatic, no cyanosis or edema and right radial cath site without ecchymosis, bruit or hematoma Neurologic: Grossly normal Psych: Pleasant  Inpatient Medications    Scheduled Meds:  amLODipine  5 mg Oral Daily   aspirin EC  81 mg Oral Daily   atorvastatin  80 mg Oral q1800   clopidogrel  75 mg Oral Daily   heparin  5,000 Units Subcutaneous Q8H   nicotine  21 mg Transdermal Daily   sodium chloride flush  3 mL Intravenous Once   sodium chloride flush  3 mL Intravenous Q12H   sodium chloride flush  3 mL Intravenous Q12H    Continuous Infusions:  sodium chloride      PRN Meds: sodium chloride, acetaminophen, diazepam, nitroGLYCERIN, ondansetron (ZOFRAN) IV, sodium chloride flush   Labs   Results for orders  placed or performed during the hospital encounter of 12/25/18 (from the past 48 hour(s))  Basic metabolic panel     Status: Abnormal   Collection Time: 12/25/18  8:49 PM  Result Value Ref Range   Sodium 132 (L) 135 - 145 mmol/L   Potassium 4.1 3.5 - 5.1 mmol/L   Chloride 93 (L) 98 - 111 mmol/L   CO2 26 22 - 32 mmol/L   Glucose, Bld 98 70 - 99 mg/dL   BUN 15 8 - 23 mg/dL   Creatinine, Ser 5.78 (H) 0.61 - 1.24 mg/dL   Calcium 9.3 8.9 - 46.9 mg/dL   GFR calc non Af Amer 53 (L) >60 mL/min   GFR calc Af Amer >60 >60 mL/min   Anion gap 13 5 - 15    Comment: Performed at [facility], [address], [place], [place] [address]  CBC     Status: Abnormal   Collection Time: 12/25/18  8:49 PM  Result Value Ref Range   WBC 10.0 4.0 - 10.5 K/uL   RBC 4.46 4.22 - 5.81 MIL/uL   Hemoglobin 15.5 13.0 - 17.0 g/dL   HCT 84.1 32.4 - 40.1 %   MCV 99.1 80.0 - 100.0 fL   MCH 34.8 (H) 26.0 - 34.0 pg   MCHC  35.1 30.0 - 36.0 g/dL   RDW 58.5 27.7 - 82.4 %   Platelets 176 150 - 400 K/uL   nRBC 0.0 0.0 - 0.2 %    Comment: Performed at [facility], [address], [place], [place] [address]  Troponin I - ONCE - STAT     Status: Abnormal   Collection Time: 12/25/18  8:49 PM  Result Value Ref Range   Troponin I 8.54 (HH) <0.03 ng/mL    Comment: CRITICAL RESULT CALLED TO, READ BACK BY AND VERIFIED WITH: NORMAN,B AT 2221 ON 5.14.20 BY ISLEY,B Performed at [facility], [address] [place], [place] [address]   D-dimer, quantitative     Status: Abnormal   Collection Time: 12/25/18  8:49 PM  Result Value Ref Range   D-Dimer, Quant 0.62 (H) 0.00 - 0.50 ug/mL-FEU    Comment: (NOTE) At the manufacturer cut-off of 0.50 ug/mL FEU, this assay has been documented to exclude PE with a sensitivity and negative predictive value of 97 to 99%.  At this time, this assay has not been approved by the FDA to exclude DVT/VTE. Results should be correlated with clinical presentation. Performed at [facility], [address] [place], [place] [address]   Protime-INR     Status: None   Collection Time: 12/25/18  8:49 PM  Result Value Ref Range   Prothrombin Time 12.3 11.4 - 15.2 seconds   INR 0.9 0.8 - 1.2    Comment: (NOTE) INR goal varies based on device and disease states. Performed at [facility], [address], [place], [place] [address]   HIV antibody (Routine Testing)     Status: None   Collection Time: 12/25/18  8:49 PM  Result Value Ref Range   HIV Screen 4th Generation wRfx Non Reactive Non Reactive    Comment: (NOTE) Performed At: [facility] [address] [place], [place] [id number] Jolene Schimke MD WP:[phone]   SARS Coronavirus 2 (CEPHEID - Performed in [facility] hospital lab), Hosp Order     Status: None   Collection Time: 12/25/18 11:09 PM  Result Value Ref Range   SARS Coronavirus 2 NEGATIVE NEGATIVE    Comment: (NOTE) If result is NEGATIVE SARS-CoV-2 target nucleic acids are NOT DETECTED. The SARS-CoV-2 RNA is generally detectable in upper and lower  respiratory specimens during the acute phase of infection. The lowest  concentration of SARS-CoV-2 viral copies this assay can detect is 250  copies / mL. A negative result does not preclude SARS-CoV-2 infection  and should not be used as the sole basis for treatment or other  patient management decisions.  A negative result may occur with  improper specimen collection / handling, submission of specimen other  than nasopharyngeal swab, presence of viral mutation(s) within the  areas targeted by this assay, and inadequate number of viral copies  (<250 copies / mL). A negative result must be combined with clinical  observations, patient history, and epidemiological information. If result is POSITIVE SARS-CoV-2 target nucleic acids are DETECTED. The SARS-CoV-2 RNA is generally detectable in upper and lower  respiratory specimens dur ing the acute phase of infection.  Positive  results are indicative of active  infection with SARS-CoV-2.  Clinical  correlation with patient history and other diagnostic information is  necessary to determine patient infection status.  Positive results do  not rule out bacterial infection or co-infection with other viruses. If result is PRESUMPTIVE POSTIVE SARS-CoV-2 nucleic acids MAY BE PRESENT.   A presumptive positive result  was obtained on the submitted specimen  and confirmed on repeat testing.  While 2019 novel coronavirus  (SARS-CoV-2) nucleic acids may be present in the submitted sample  additional confirmatory testing may be necessary for epidemiological  and / or clinical management purposes  to differentiate between  SARS-CoV-2 and other Sarbecovirus currently known to infect humans.  If clinically indicated additional testing with an alternate test  methodology [phone]) is advised. The SARS-CoV-2 RNA is generally  detectable in upper and lower respiratory sp ecimens during the acute  phase of infection. The expected result is Negative. Fact Sheet for Patients:  BoilerBrush.com.cy Fact Sheet for Healthcare Providers: https://pope.com/ This test is not yet approved or cleared by the [place] FDA and has been authorized for detection and/or diagnosis of SARS-CoV-2 by FDA under an Emergency Use Authorization (EUA).  This EUA will remain in effect (meaning this test can be used) for the duration of the COVID-19 declaration under Section 564(b)(1) of the Act, 21 U.S.C. section 360bbb-3(b)(1), unless the authorization is terminated or revoked sooner. Performed at [facility], [address] [place], [place] [address]   Troponin I - Now Then Q6H     Status: Abnormal   Collection Time: 12/26/18  8:25 AM  Result Value Ref Range   Troponin I 7.88 (HH) <0.03 ng/mL    Comment: CRITICAL RESULT CALLED TO, READ BACK BY AND VERIFIED WITH: Charna Elizabeth RN [phone] [id number] BY A BENNETT Performed at [facility], [address], [place], [place] [address]   Lipid panel     Status: None   Collection Time: 12/26/18  8:25 AM  Result Value Ref Range   Cholesterol 173 0 - 200 mg/dL   Triglycerides 657 <846 mg/dL   HDL 53 >96 mg/dL   Total CHOL/HDL Ratio 3.3 RATIO   VLDL 22 0 - 40 mg/dL   LDL Cholesterol 98 0 - 99 mg/dL    Comment:        Total Cholesterol/HDL:CHD Risk Coronary Heart Disease Risk Table                     Men   Women  1/2 Average Risk   3.4   3.3  Average Risk       5.0   4.4  2 X Average Risk   9.6   7.1  3 X Average Risk  23.4   11.0        Use the calculated Patient Ratio above and the CHD Risk Table to determine the patient's CHD Risk.        ATP III CLASSIFICATION (LDL):  <100     mg/dL   Optimal  295-284  mg/dL   Near or Above                    Optimal  130-159  mg/dL   Borderline  132-440  mg/dL   High  >102     mg/dL   Very High Performed at [facility], [address] [place], [place] [address]   Basic metabolic panel     Status: Abnormal   Collection Time: 12/26/18  9:44 AM  Result Value Ref Range   Sodium 135 135 - 145 mmol/L   Potassium 4.8 3.5 - 5.1 mmol/L   Chloride 98 98 - 111 mmol/L   CO2 24 22 - 32 mmol/L   Glucose, Bld 96 70 - 99 mg/dL   BUN 16 8 - 23  mg/dL   Creatinine, Ser 1.611.49 (H) 0.61 - 1.24 mg/dL   Calcium 9.5 8.9 - 09.610.3 mg/dL   GFR calc non Af Amer 49 (L) >60 mL/min   GFR calc Af Amer 57 (L) >60 mL/min   Anion gap 13 5 - 15    Comment: Performed at [facility], [address] [place], [place] [address]  Heparin level (unfractionated)     Status: Abnormal   Collection Time: 12/26/18  9:44 AM  Result Value Ref Range   Heparin Unfractionated 0.11 (L) 0.30 - 0.70 IU/mL    Comment: (NOTE) If heparin results are below expected values, and patient dosage has  been confirmed, suggest follow up testing of antithrombin III levels. Performed at [facility], [address] [place], [place] [address]   CBC      Status: Abnormal   Collection Time: 12/26/18  9:44 AM  Result Value Ref Range   WBC 9.1 4.0 - 10.5 K/uL   RBC 4.79 4.22 - 5.81 MIL/uL   Hemoglobin 16.7 13.0 - 17.0 g/dL   HCT 14.746.5 82.939.0 - 56.252.0 %   MCV 97.1 80.0 - 100.0 fL   MCH 34.9 (H) 26.0 - 34.0 pg   MCHC 35.9 30.0 - 36.0 g/dL   RDW 13.011.9 86.511.5 - 78.415.5 %   Platelets 215 150 - 400 K/uL   nRBC 0.0 0.0 - 0.2 %    Comment: Performed at [facility], [address] [place], [place] [address]  POCT Activated clotting time     Status: None   Collection Time: 12/26/18  1:21 PM  Result Value Ref Range   Activated Clotting Time 296 seconds  POCT Activated clotting time     Status: None   Collection Time: 12/26/18  1:33 PM  Result Value Ref Range   Activated Clotting Time 0 seconds  POCT Activated clotting time     Status: None   Collection Time: 12/26/18  1:44 PM  Result Value Ref Range   Activated Clotting Time 313 seconds  Troponin I - Now Then Q6H     Status: Abnormal   Collection Time: 12/26/18  2:33 PM  Result Value Ref Range   Troponin I 8.20 (HH) <0.03 ng/mL    Comment: CRITICAL VALUE NOTED.  VALUE IS CONSISTENT WITH PREVIOUSLY REPORTED AND CALLED VALUE. Performed at [facility], [address] [place], [place] [address]   Troponin I - Now Then Q6H     Status: Abnormal   Collection Time: 12/26/18  8:32 PM  Result Value Ref Range   Troponin I 11.60 (HH) <0.03 ng/mL    Comment: CRITICAL VALUE NOTED.  VALUE IS CONSISTENT WITH PREVIOUSLY REPORTED AND CALLED VALUE. Performed at [facility], [address] [place], [place] [address]   Basic metabolic panel     Status: Abnormal   Collection Time: 12/27/18  3:38 AM  Result Value Ref Range   Sodium 135 135 - 145 mmol/L   Potassium 4.0 3.5 - 5.1 mmol/L   Chloride 105 98 - 111 mmol/L   CO2 21 (L) 22 - 32 mmol/L   Glucose, Bld 102 (H) 70 - 99 mg/dL   BUN 15 8 - 23 mg/dL   Creatinine, Ser 1.021.30 (H) 0.61 - 1.24 mg/dL   Calcium 8.5 (L) 8.9 - 10.3 mg/dL   GFR calc  non Af Amer 58 (L) >60 mL/min   GFR calc Af Amer >60 >60 mL/min   Anion gap  9 5 - 15    Comment: Performed at [facility], [address] [place], [place] [address]  CBC     Status: Abnormal   Collection Time: 12/27/18  3:38 AM  Result Value Ref Range   WBC 7.0 4.0 - 10.5 K/uL   RBC 4.13 (L) 4.22 - 5.81 MIL/uL   Hemoglobin 14.3 13.0 - 17.0 g/dL   HCT 60.4 54.0 - 98.1 %   MCV 97.3 80.0 - 100.0 fL   MCH 34.6 (H) 26.0 - 34.0 pg   MCHC 35.6 30.0 - 36.0 g/dL   RDW 19.1 47.8 - 29.5 %   Platelets 189 150 - 400 K/uL   nRBC 0.0 0.0 - 0.2 %    Comment: Performed at [facility], [address] [place], [place] [address]    ECG   NSR at 83, nS T wave changes - Personally Reviewed  Telemetry   Sinus rhythm - Personally Reviewed  Radiology    Dg Chest 2 View  Result Date: 12/25/2018 CLINICAL DATA:  Chest pain for 4 days. EXAM: CHEST - 2 VIEW COMPARISON:  PA and lateral chest 02/17/2016. FINDINGS: Lungs clear. Heart size normal. No pneumothorax or pleural effusion. No acute or focal bony abnormality. IMPRESSION: No acute disease. Electronically Signed   By: Drusilla Kanner M.D.   On: 12/25/2018 20:49    Cardiac Studies   Procedure: 2D Echo and Intracardiac Opacification Agent  Indications:    Acute myocardial infarction 410   History:        Patient has prior history of Echocardiogram examinations, most                 recent 02/19/2016. Risk Factors: Current Smoker, Hypertension and                 Dyslipidemia.   Sonographer:    Ross Ludwig RDCS (AE) Referring Phys: [id number] [facility]    Sonographer Comments: Suboptimal parasternal window. COPD. IMPRESSIONS    1. The left ventricle has mildly reduced systolic function, with an ejection fraction of 45-50%. The cavity size was normal. Left ventricular diastolic Doppler parameters are consistent with impaired relaxation. Indeterminate filling pressures The E/e'  is 8-15.  2. Moderate hypokinesis of  the left ventricular, entire inferior wall and inferolateral wall.  3. The right ventricle has normal systolic function. The cavity was normal. There is no increase in right ventricular wall thickness.  4. The mitral valve is abnormal. Mild thickening of the mitral valve leaflet.  5. The tricuspid valve is grossly normal.  6. The aortic valve was not well visualized. Mild sclerosis of the aortic valve.  SUMMARY   Technically difficult study. Definity contrast used. LVEF 45-50%, normal LV wall thickness, inferior and inferolateral hypokinesis, grade 1 DD, indeterminate LV filling pressure  Assessment   Principal Problem:   NSTEMI (non-ST elevated myocardial infarction) (HCC) Active Problems:   Tobacco abuse   Hyperlipidemia   Essential hypertension   Plan   1. Mr. Blancett had successful PCI to OM2 - echo shows mildly reduced LVEF to 45-50% with inferolateral hypokinesis. He feels much better today. Will need DAPT x 1 year. On high potency atorvastatin 80 mg and amlodipine for BP. Had AKI with creatinine of 1.5, now 1.3 today - will switch amlodipine to losartan 50 mg daily at discharge. Ok for d/c home today. Will arrange for follow-up with Dr. Antoine Poche in [place] for patient convienence.   Time Spent Directly with Patient:  I have spent a total of 25 minutes with the patient reviewing hospital notes, telemetry, EKGs, labs and examining the patient as well as establishing an assessment and plan that was discussed personally with the patient.  > 50% of time was spent in direct patient care.  Length of Stay:  LOS: 1 day   Chrystie Nose, MD, [facility], FACP  [place]   [facility]  Medical Director of the Advanced Lipid Disorders &  Cardiovascular Risk Reduction Clinic Diplomate of the American Board of Clinical Lipidology Attending Cardiologist  Direct Dial: [phone]   Fax: [phone]  Website:  [email or web address] Nicely Hudsyn Barich 12/27/2018, 12:08 PM

## 2018-12-29 ENCOUNTER — Encounter (HOSPITAL_COMMUNITY): Payer: Self-pay | Admitting: Cardiovascular Disease

## 2018-12-29 MED FILL — Clopidogrel Bisulfate Tab 75 MG (Base Equiv): ORAL | Qty: 2 | Status: AC

## 2018-12-29 MED FILL — Perflutren Lipid Microsphere IV Susp 1.1 MG/ML: INTRAVENOUS | Qty: 10 | Status: AC

## 2018-12-30 NOTE — Progress Notes (Signed)
Virtual Visit via Video Note   This visit type was conducted due to national recommendations for restrictions regarding the COVID-19 Pandemic (e.g. social distancing) in an effort to limit this patient's exposure and mitigate transmission in our community.  Due to his co-morbid illnesses, this patient is at least at moderate risk for complications without adequate follow up.  This format is felt to be most appropriate for this patient at this time.  All issues noted in this document were discussed and addressed.  A limited physical exam was performed with this format.  Please refer to the patient's chart for his consent to telehealth for [facility].   Date:  12/31/2018   ID:  Michael Shellony E Lamontagne, DOB 11-19-1955, MRN [id number]  Patient Location: Home Provider Location: Home  PCP:  Wilson SingerGosrani, Nimish C, MD  Cardiologist:  Lance MussJayadeep Varanasi, MD  Electrophysiologist:  None   Evaluation Performed:  Follow-Up Visit  Chief Complaint:  Chest pain  History of Present Illness:    Michael Cortez is a 63 y.o. male who was admited recently with a NSTEMI.  He had a cath with results listed below.  He had a mild/mod reduced EFof 45 - 50%.  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    Since going home he has been doing OK.  He has been trying to walk a little more briskly than I would like.  He might get a little discomfort when he does that.  He denies any chest pressure, neck or arm discomfort similar to his MI.  He is not having any new shortness of breath, PND or orthopnea.  Is not taking any nitroglycerin.  He is not smoking cigarettes.  The patient does not have symptoms concerning for COVID-19 infection (fever, chills, cough, or new shortness of breath).    Past Medical History:  Diagnosis Date  . Anxiety and depression   . Chest pain 2005   03/2012: neg nuclear stress.  2005- Cardiac cath:20-40% lesions in the LAD, circumflex and RCA with normal EF  . Degenerative joint  disease   . Hyperlipidemia   . Tobacco abuse    40 pack year total consumption   Past Surgical History:  Procedure Laterality Date  . APPENDECTOMY    . CORONARY STENT INTERVENTION N/A 12/26/2018   Procedure: CORONARY STENT INTERVENTION;  Surgeon: Lennette BihariKelly, Thomas A, MD;  [facility];  Service: Cardiovascular;  Laterality: N/A;  . LEFT HEART CATH AND CORONARY ANGIOGRAPHY N/A 12/26/2018   Procedure: LEFT HEART CATH AND CORONARY ANGIOGRAPHY;  Surgeon: Lennette BihariKelly, Thomas A, MD;  Location: [facility] INVASIVE CV LAB;  Service: Cardiovascular;  Laterality: N/A;     Prior to Admission medications   Medication Sig Start Date End Date Taking? Authorizing Provider  aspirin EC 81 MG EC tablet Take 1 tablet (81 mg total) by mouth daily. 12/28/18  Yes Marjie SkiffGoodrich, Callie E, PA-C  atorvastatin (LIPITOR) 80 MG tablet Take 1 tablet (80 mg total) by mouth daily at 6 PM. 12/27/18  Yes Marjie SkiffGoodrich, Callie E, PA-C  Cholecalciferol (VITAMIN D) 125 MCG (5000 UT) CAPS Take 5,000 Units by mouth daily.   Yes [provider]  clopidogrel (PLAVIX) 75 MG tablet Take 1 tablet (75 mg total) by mouth daily. 02/19/16  Yes Johnson, Clanford L, MD  losartan (COZAAR) 50 MG tablet Take 1 tablet (50 mg total) by mouth daily. 12/28/18  Yes Marjie SkiffGoodrich, Callie E, PA-C  metoprolol succinate (TOPROL XL) 25 MG 24 hr tablet Take  0.5 tablets (12.5 mg total) by mouth daily. 12/27/18 12/27/19 Yes Goodrich, Callie E, PA-C  nitroGLYCERIN (NITROSTAT) 0.4 MG SL tablet Place 1 tablet (0.4 mg total) under the tongue every 5 (five) minutes x 3 doses as needed for chest pain. 12/27/18  Yes Marjie Skiff E, PA-C  pantoprazole (PROTONIX) 40 MG tablet Take 40 mg by mouth 2 (two) times daily. 12/25/18  Yes [provider]  testosterone cypionate (DEPOTESTOSTERONE CYPIONATE) 200 MG/ML injection Inject 0.5 mLs into the muscle once a week. Wednesday 09/09/18  Yes [provider]    Allergies:   Patient has no known allergies.   Social  History   Tobacco Use  . Smoking status: Current Every Day Smoker    Packs/day: 1.00    Years: 40.00    Pack years: 40.00  . Smokeless tobacco: Never Used  Substance Use Topics  . Alcohol use: No  . Drug use: No     Family Hx: The patient's family history includes Coronary artery disease (age of onset: 75) in his father.  ROS:   Please see the history of present illness.    As stated in the HPI and negative for all other systems.   Prior CV studies:    12/26/18 CATH  Diagnostic  Dominance: Right    Intervention      Labs/Other Tests and Data Reviewed:    EKG:  No ECG reviewed.  Recent Labs: 12/27/2018: BUN 15; Creatinine, Ser 1.30; Hemoglobin 14.3; Platelets 189; Potassium 4.0; Sodium 135   Recent Lipid Panel Lab Results  Component Value Date/Time   CHOL 173 12/26/2018 08:25 AM   CHOL 207 (H) 03/12/2013 04:15 PM   TRIG 111 12/26/2018 08:25 AM   TRIG 87 03/12/2013 04:15 PM   HDL 53 12/26/2018 08:25 AM   HDL 47 03/12/2013 04:15 PM   CHOLHDL 3.3 12/26/2018 08:25 AM   LDLCALC 98 12/26/2018 08:25 AM   LDLCALC 143 (H) 03/12/2013 04:15 PM    Wt Readings from Last 3 Encounters:  12/31/18 179 lb (81.2 kg)  12/27/18 178 lb 9.6 oz (81 kg)  02/18/16 194 lb (88 kg)     Objective:    Vital Signs:  BP (!) 149/63 (BP Location: Left Arm, Patient Position: Sitting, Cuff Size: Normal)   Pulse 87   Ht  (1.676 m)   Wt 179 lb (81.2 kg)   BMI 28.89 kg/m    VITAL SIGNS:  reviewed GEN:  no acute distress EYES:  sclerae anicteric, EOMI - Extraocular Movements Intact NEURO:  alert and oriented x 3, no obvious focal deficit PSYCH:  normal affect  ASSESSMENT & PLAN:    NSTEMI   I showed him his coronary anatomy today.  He is going to continue with his medications as listed although I will slightly increase his metoprolol to 25 mg daily.  I will titrate meds as tolerated.  Of note he might need to stay out of work longer than 2 weeks and he will let me know if he  needs another letter.  Hypertension  This is being managed in the context of treating his CHF  Hyperlipidemia  Goal will be an LDL less than 70.  Continue current therapy.  Ischemic cardiomyopathy I will titrate meds and eventually probably switch to [facility].  AKI  Serum creatinine 1.41 on admission. Baseline unknown. Improved to 1.30.  We will follow with med titration.   Tobacco Use  He is not smoking.   COVID-19 Education: The signs and symptoms of  COVID-19 were discussed with the patient and how to seek care for testing (follow up with PCP or arrange E-visit).  The importance of social distancing was discussed today.  Time:   Today, I have spent 25 minutes with the patient with telehealth technology discussing the above problems.     Medication Adjustments/Labs and Tests Ordered: Current medicines are reviewed at length with the patient today.  Concerns regarding medicines are outlined above.   Tests Ordered: No orders of the defined types were placed in this encounter.   Medication Changes: No orders of the defined types were placed in this encounter.   Disposition:  Follow up with me virtually in 1 month.  Signed, Rollene Rotunda, MD  12/31/2018 9:46 AM    [facility]

## 2018-12-31 ENCOUNTER — Other Ambulatory Visit (HOSPITAL_COMMUNITY)
Admission: RE | Admit: 2018-12-31 | Discharge: 2018-12-31 | Disposition: A | Discharge status: Home / Self Care | Payer: BLUE CROSS/BLUE SHIELD | Source: Ambulatory Visit | Attending: Student | Admitting: Student

## 2018-12-31 ENCOUNTER — Telehealth (INDEPENDENT_AMBULATORY_CARE_PROVIDER_SITE_OTHER): Payer: BLUE CROSS/BLUE SHIELD | Admitting: Cardiology

## 2018-12-31 ENCOUNTER — Other Ambulatory Visit: Payer: Self-pay | Admitting: Student

## 2018-12-31 ENCOUNTER — Encounter: Payer: Self-pay | Admitting: Cardiology

## 2018-12-31 VITALS — BP 149/63 | HR 87 | Ht 66.0 in | Wt 179.0 lb

## 2018-12-31 DIAGNOSIS — I214 Non-ST elevation (NSTEMI) myocardial infarction: Secondary | ICD-10-CM | POA: Diagnosis not present

## 2018-12-31 DIAGNOSIS — I10 Essential (primary) hypertension: Secondary | ICD-10-CM

## 2018-12-31 DIAGNOSIS — N289 Disorder of kidney and ureter, unspecified: Secondary | ICD-10-CM

## 2018-12-31 DIAGNOSIS — Z720 Tobacco use: Secondary | ICD-10-CM

## 2018-12-31 DIAGNOSIS — E785 Hyperlipidemia, unspecified: Secondary | ICD-10-CM

## 2018-12-31 LAB — BASIC METABOLIC PANEL
Anion gap: 7 (ref 5–15)
BUN: 23 mg/dL (ref 8–23)
CO2: 28 mmol/L (ref 22–32)
Calcium: 9.4 mg/dL (ref 8.9–10.3)
Chloride: 103 mmol/L (ref 98–111)
Creatinine, Ser: 1.41 mg/dL — ABNORMAL HIGH (ref 0.61–1.24)
GFR calc Af Amer: >60 mL/min (ref >60)
GFR calc non Af Amer: 53 mL/min — ABNORMAL LOW (ref >60)
Glucose, Bld: 82 mg/dL (ref 70–99)
Potassium: 5.3 mmol/L — ABNORMAL HIGH (ref 3.5–5.1)
Sodium: 138 mmol/L (ref 135–145)

## 2018-12-31 MED ORDER — METOPROLOL SUCCINATE ER 25 MG PO TB24: 25.0000 mg | ORAL_TABLET | Freq: Every day | ORAL | 3 refills | Status: DC

## 2018-12-31 NOTE — Addendum Note (Signed)
Addended by: Sharin Grave on: 12/31/2018 10:15 AM   Modules accepted: Orders

## 2018-12-31 NOTE — Patient Instructions (Signed)
Medication Instructions:  Please increase Metoprolol succinate to 25 mg once a day. Continue all other medications as listed.  If you need a refill on your cardiac medications before your next appointment, please call your pharmacy.   Follow-Up: Please follow up with Dr Antoine Poche in 1 month.  Thank you for choosing [facility]!!

## 2019-01-06 ENCOUNTER — Telehealth: Payer: Self-pay | Admitting: Cardiology

## 2019-01-06 NOTE — Telephone Encounter (Signed)
Patient called the [place] office. He is a patient of Dr. Antoine Poche at the [facility] office, but when he tried to call the [place] number [phone]) the number did not work.  He had a question for Dr. Jenene Slicker nurse

## 2019-01-06 NOTE — Telephone Encounter (Signed)
Calling to speak with patient regarding his concerns however pt states he is busy now and asked if I can call him back later.  I asked when would be a good time and I will try then.  He responded just later or maybe tomorrow.  He asked about sending a message through MyChart.  Advised that he can, to send it to Dr Antoine Poche but to include in the message to send to [facility] office nurse.

## 2019-01-07 ENCOUNTER — Other Ambulatory Visit (HOSPITAL_COMMUNITY)
Admission: RE | Admit: 2019-01-07 | Discharge: 2019-01-07 | Disposition: A | Discharge status: Home / Self Care | Payer: BLUE CROSS/BLUE SHIELD | Source: Ambulatory Visit | Attending: Student | Admitting: Student

## 2019-01-07 ENCOUNTER — Other Ambulatory Visit: Payer: Self-pay

## 2019-01-07 DIAGNOSIS — N289 Disorder of kidney and ureter, unspecified: Secondary | ICD-10-CM | POA: Insufficient documentation

## 2019-01-07 LAB — BASIC METABOLIC PANEL
Anion gap: 12 (ref 5–15)
BUN: 18 mg/dL (ref 8–23)
CO2: 24 mmol/L (ref 22–32)
Calcium: 9 mg/dL (ref 8.9–10.3)
Chloride: 103 mmol/L (ref 98–111)
Creatinine, Ser: 1.25 mg/dL — ABNORMAL HIGH (ref 0.61–1.24)
GFR calc Af Amer: >60 mL/min (ref >60)
GFR calc non Af Amer: >60 mL/min (ref >60)
Glucose, Bld: 106 mg/dL — ABNORMAL HIGH (ref 70–99)
Potassium: 3.9 mmol/L (ref 3.5–5.1)
Sodium: 139 mmol/L (ref 135–145)

## 2019-01-07 NOTE — Telephone Encounter (Signed)
He can return to work on Monday

## 2019-01-07 NOTE — Telephone Encounter (Signed)
Spoke with patient who is asking how much weight he can lift now and when he can return to work.  He works part-time as a Nutritional therapist and has to be able to lift approximately 70 lbs at a time several times a day.  He has been walking about 1 block every morning and plans to increase that to 2 blocks tomorrow.   He is not involved in any type of cardiac rehab.  Encouraged pt to continue to increase regular exercise/activity.  He reports he lifted a case of water recently and it made him feel "give out."  No chest pain but does on occasion feel a soreness in his chest - it comes and goes with movement.  He states right now he is to return to work on Monday if Dr Antoine Poche determines that is OK for him to do.  If he doesn't feel it's appropriate for pt to return to work on Monday, he will need a note stating when he can return to work and if any restrictions.  Advised I will send this information to Dr Antoine Poche for review and call back as soon I have an answer for him.

## 2019-01-08 ENCOUNTER — Telehealth: Payer: Self-pay | Admitting: *Deleted

## 2019-01-08 ENCOUNTER — Encounter: Payer: Self-pay | Admitting: *Deleted

## 2019-01-08 NOTE — Telephone Encounter (Signed)
OK to hold off of work for another week.

## 2019-01-08 NOTE — Telephone Encounter (Signed)
Pt is aware he may return to work on Monday per Dr Antoine Poche.

## 2019-01-08 NOTE — Telephone Encounter (Signed)
Notes recorded by Samson Frederic, RN on 01/08/2019 at 1:31 PM EDT Called patient and informed him of his lab results and to continue his current medications and also to keep his follow-up appointment with Dr. Antoine Poche. Patient also voiced concerns about getting an extra week off of work. He stated the Dr. Lindaann Slough nurse was speaking to him about yesterday. Will route this message to her so she can answer his question about returning to work.  I called the patient earlier today and advised of Dr Hochrein's approval to return to work on Monday.  The patient stated he was unsure of how much he would be able to do but would try to return on Monday.  Pt did not express wanting or needing an extension of leave from work at the time.   Now, evidently, he is asking for a note to remain out of work for another week, returning January 19, 2019.  The patient's main concern yesterday when we spoke was in regard to having to lift heavy batteries several times a day.  Will forward to Dr Antoine Poche for review.

## 2019-01-08 NOTE — Telephone Encounter (Signed)
Pt aware of approval to wait to return to work until 01/19/2019.  A letter will be sent to his MyChart at his request.

## 2019-01-16 ENCOUNTER — Telehealth: Payer: Self-pay | Admitting: Cardiology

## 2019-01-16 NOTE — Telephone Encounter (Signed)
Spoke with pt, he reports dizziness or lightheadedness mainly when sitting. He has not noticed any dizziness after standing from sitting. He does report the room spins and lying down seems to make it a little worse. It is not always associated with his bp being in the 50's diastolic. Explained to patient it sounds like vertigo instead of bp issues. He will try claritin or zyrtec to see if that helps with the dizziness. He will call back by mid week next week if sympotoms have not changed or gotten worse. Pt agreed with this plan.

## 2019-01-16 NOTE — Telephone Encounter (Signed)
New Message    STAT if patient feels like he/she is going to faint   1) Are you dizzy now? Yes, he says he felt that way yesterday and ate but still feel weak   2) Do you feel faint or have you passed out? When his BP drops to 50 on the bottom he has to sit down. But has not passed out   3) Do you have any other symptoms?  4) Have you checked your HR and BP (record if available)? Yesterday around 330/4 pm 117/50   This morning BP133/60

## 2019-02-04 ENCOUNTER — Encounter (INDEPENDENT_AMBULATORY_CARE_PROVIDER_SITE_OTHER): Payer: BLUE CROSS/BLUE SHIELD | Admitting: Internal Medicine

## 2019-02-09 ENCOUNTER — Telehealth: Payer: Self-pay | Admitting: *Deleted

## 2019-02-09 NOTE — Progress Notes (Signed)
Cardiology Office Note   Date:  02/11/2019   ID:  Michael Cortez, DOB 12-03-1955, MRN [id number]  PCP:  Wilson SingerGosrani, Nimish C, MD  Cardiologist:   Rollene RotundaJames Jennet Scroggin, MD   No chief complaint on file.     History of Present Illness: Michael Cortez is a 63 y.o. male who was admited last month with a NSTEMI.  He had a cath with results listed below.  He had a mild/mod reduced EFof 45 - 50%.  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   At the last visit I increased his metoprolol.    Since I last saw him he is done okay.  He moved to a different apartment and he had to go up and down the stairs moving things.  He has no chest pain or shortness of breath with this.  He has had no palpitations, presyncope or syncope.  He said no weight gain or edema.  He has smoked a few cigarettes when he was under stress moving.  He is not smoking any longer.   Past Medical History:  Diagnosis Date  . Anxiety and depression   . Chest pain 2005   03/2012: neg nuclear stress.  2005- Cardiac cath:20-40% lesions in the LAD, circumflex and RCA with normal EF  . Degenerative joint disease   . Hyperlipidemia   . Tobacco abuse    40 pack year total consumption    Past Surgical History:  Procedure Laterality Date  . APPENDECTOMY    . CORONARY STENT INTERVENTION N/A 12/26/2018   Procedure: CORONARY STENT INTERVENTION;  Surgeon: Lennette BihariKelly, Thomas A, MD;  [facility];  Service: Cardiovascular;  Laterality: N/A;  . LEFT HEART CATH AND CORONARY ANGIOGRAPHY N/A 12/26/2018   Procedure: LEFT HEART CATH AND CORONARY ANGIOGRAPHY;  Surgeon: Lennette BihariKelly, Thomas A, MD;  Location: [facility] INVASIVE CV LAB;  Service: Cardiovascular;  Laterality: N/A;     Current Outpatient Medications  Medication Sig Dispense Refill  . aspirin EC 81 MG EC tablet Take 1 tablet (81 mg total) by mouth daily.    Marland Kitchen. atorvastatin (LIPITOR) 80 MG tablet Take 1 tablet (80 mg total) by mouth daily at 6 PM. 30 tablet 2  .  Cholecalciferol (VITAMIN D) 125 MCG (5000 UT) CAPS Take 5,000 Units by mouth daily.    . clopidogrel (PLAVIX) 75 MG tablet Take 1 tablet (75 mg total) by mouth daily. 30 tablet 0  . losartan (COZAAR) 50 MG tablet Take 1 tablet (50 mg total) by mouth daily. 30 tablet 2  . metoprolol succinate (TOPROL XL) 25 MG 24 hr tablet Take 1 tablet (25 mg total) by mouth daily. 90 tablet 3  . nitroGLYCERIN (NITROSTAT) 0.4 MG SL tablet Place 1 tablet (0.4 mg total) under the tongue every 5 (five) minutes x 3 doses as needed for chest pain. 30 tablet 5  . pantoprazole (PROTONIX) 40 MG tablet Take 40 mg by mouth 2 (two) times daily.    Marland Kitchen. testosterone cypionate (DEPOTESTOSTERONE CYPIONATE) 200 MG/ML injection Inject 0.5 mLs into the muscle once a week. Wednesday     No current facility-administered medications for this visit.     Allergies:   Patient has no known allergies.    ROS:  Please see the history of present illness.   Otherwise, review of systems are positive for none.   All other systems are reviewed and negative.    PHYSICAL EXAM: VS:  BP 122/70   Pulse 68  Ht 5\' 6"  (1.676 m)   Wt 187 lb (84.8 kg)   BMI 30.18 kg/m  , BMI Body mass index is 30.18 kg/m. GENERAL:  Well appearing HEENT:  Pupils equal round and reactive, fundi not visualized, oral mucosa unremarkable NECK:  No jugular venous distention, waveform within normal limits, carotid upstroke brisk and symmetric, no bruits, no thyromegaly LYMPHATICS:  No cervical, inguinal adenopathy LUNGS:  Clear to auscultation bilaterally BACK:  No CVA tenderness CHEST:  Unremarkable HEART:  PMI not displaced or sustained,S1 and S2 within normal limits, no S3, no S4, no clicks, no rubs, no murmurs ABD:  Flat, positive bowel sounds normal in frequency in pitch, no bruits, no rebound, no guarding, no midline pulsatile mass, no hepatomegaly, no splenomegaly EXT:  2 plus pulses throughout, no edema, no cyanosis no clubbing SKIN:  No rashes no nodules  NEURO:  Cranial nerves II through XII grossly intact, motor grossly intact throughout PSYCH:  Cognitively intact, oriented to person place and time    EKG:  EKG is not ordered today.   Recent Labs: 12/27/2018: Hemoglobin 14.3; Platelets 189 01/07/2019: BUN 18; Creatinine, Ser 1.25; Potassium 3.9; Sodium 139    Lipid Panel    Component Value Date/Time   CHOL 173 12/26/2018 0825   CHOL 207 (H) 03/12/2013 1615   TRIG 111 12/26/2018 0825   TRIG 87 03/12/2013 1615   HDL 53 12/26/2018 0825   HDL 47 03/12/2013 1615   CHOLHDL 3.3 12/26/2018 0825   VLDL 22 12/26/2018 0825   LDLCALC 98 12/26/2018 0825   LDLCALC 143 (H) 03/12/2013 1615      Wt Readings from Last 3 Encounters:  02/11/19 187 lb (84.8 kg)  12/31/18 179 lb (81.2 kg)  12/27/18 178 lb 9.6 oz (81 kg)      Other studies Reviewed: Additional studies/ records that were reviewed today include: Labs. Review of the above records demonstrates:  Please see elsewhere in the note.     ASSESSMENT AND PLAN:  NSTEMI  The patient has no new sypmtoms.  No further cardiovascular testing is indicated.  We will continue with aggressive risk reduction and meds as listed with the change below  Hypertension This is being managed in the context of treating his CHF  Hyperlipidemia  Goal will be an LDL less than 70.   In May he had an LDL of 98.  He should have this repeated in September.  I was able to retrieve and review these labs.  Ischemic cardiomyopathy I am going to titrate his metoprolol to 50 mg daily.   AKI   His last serum creatinine was 1.3.  No change in therapy.   Current medicines are reviewed at length with the patient today.  The patient does not have concerns regarding medicines.  The following changes have been made:  As above  Labs/ tests ordered today include: None No orders of the defined types were placed in this encounter.    Disposition:   FU with me in six months.     Signed, Minus Breeding, MD   02/11/2019 10:31 AM    [facility]

## 2019-02-09 NOTE — Telephone Encounter (Signed)
    COVID-19 Pre-Screening Questions:  . In the past 7 to 10 days have you had a cough,  shortness of breath, headache, congestion, fever (100 or greater) body aches, chills, sore throat, or sudden loss of taste or sense of smell? . Have you been around anyone with known Covid 19. . Have you been around anyone who is awaiting Covid 19 test results in the past 7 to 10 days? . Have you been around anyone who has been exposed to Covid 19, or has mentioned symptoms of Covid 19 within the past 7 to 10 days?  If you have any concerns/questions about symptoms patients report during screening (either on the phone or at threshold). Contact the provider seeing the patient or DOD for further guidance.  If neither are available contact a member of the leadership team.       Patient answered no to all questions. He knows to wear a mask, come only 15 minutes early and alone.Also, to call us if his health changes.

## 2019-02-11 ENCOUNTER — Encounter: Payer: Self-pay | Admitting: Cardiology

## 2019-02-11 ENCOUNTER — Other Ambulatory Visit: Payer: Self-pay

## 2019-02-11 ENCOUNTER — Telehealth (INDEPENDENT_AMBULATORY_CARE_PROVIDER_SITE_OTHER): Payer: BC Managed Care – PPO | Admitting: Cardiology

## 2019-02-11 VITALS — BP 122/70 | HR 68 | Ht 66.0 in | Wt 187.0 lb

## 2019-02-11 DIAGNOSIS — I214 Non-ST elevation (NSTEMI) myocardial infarction: Secondary | ICD-10-CM

## 2019-02-11 DIAGNOSIS — E785 Hyperlipidemia, unspecified: Secondary | ICD-10-CM

## 2019-02-11 DIAGNOSIS — I10 Essential (primary) hypertension: Secondary | ICD-10-CM

## 2019-02-11 DIAGNOSIS — Z720 Tobacco use: Secondary | ICD-10-CM | POA: Diagnosis not present

## 2019-02-11 MED ORDER — LOSARTAN POTASSIUM 50 MG PO TABS: 50.0000 mg | ORAL_TABLET | Freq: Every day | ORAL | 3 refills | Status: DC

## 2019-02-11 MED ORDER — METOPROLOL SUCCINATE ER 50 MG PO TB24: 50.0000 mg | ORAL_TABLET | Freq: Every day | ORAL | 3 refills | Status: DC

## 2019-02-11 NOTE — Patient Instructions (Signed)
Medication Instructions:  Please increase your Metoprolol to 50 mg a day. Continue all other medications as listed.  If you need a refill on your cardiac medications before your next appointment, please call your pharmacy.   Follow-Up: Follow up in 6 months with Dr. Allena Napoleon will receive a letter in the mail 2 months before you are due.  Please call us when you receive this letter to schedule your follow up appointment.  Thank you for choosing [facility]!!

## 2019-03-26 ENCOUNTER — Other Ambulatory Visit: Payer: Self-pay | Admitting: Student

## 2019-03-27 ENCOUNTER — Telehealth: Payer: Self-pay | Admitting: Cardiology

## 2019-03-27 NOTE — Telephone Encounter (Signed)
Patient called stating he picked up his medications from the pharmacy today and they gave him amLODipine (NORVASC) tablet 5 mg.  He doesn't think he is suppose to take that because he was switched to another BP medication.

## 2019-03-27 NOTE — Telephone Encounter (Signed)
Called patient, advised that Amlodipine was taken off list- patient only on Metoprolol per last telelvisit.

## 2019-04-01 ENCOUNTER — Telehealth: Payer: Self-pay | Admitting: Cardiology

## 2019-04-01 ENCOUNTER — Encounter: Payer: Self-pay | Admitting: *Deleted

## 2019-04-01 NOTE — Telephone Encounter (Signed)
Advised patient to contact PCP for sleep medications, verbalized understanding.

## 2019-04-01 NOTE — Telephone Encounter (Signed)
New Message    Patient states he's not sleeping to well and wants to know if the doctor could recommend something for him.

## 2019-04-16 ENCOUNTER — Telehealth (INDEPENDENT_AMBULATORY_CARE_PROVIDER_SITE_OTHER): Payer: Self-pay

## 2019-04-16 ENCOUNTER — Telehealth (INDEPENDENT_AMBULATORY_CARE_PROVIDER_SITE_OTHER): Payer: Self-pay | Admitting: Internal Medicine

## 2019-04-16 NOTE — Telephone Encounter (Signed)
Spoke with pt and gave instructions.

## 2019-04-16 NOTE — Telephone Encounter (Signed)
Patient called and said that the last medication that you put him on is not working and wanted to know if he could talk to you some time today about this.

## 2019-04-16 NOTE — Telephone Encounter (Signed)
Please recommend that he should take 2 tablets of Cialis prior to sex.  If this does not work, he should let me know.  Please document this in the chart as a phone call.  Thanks.

## 2019-05-11 ENCOUNTER — Ambulatory Visit (INDEPENDENT_AMBULATORY_CARE_PROVIDER_SITE_OTHER): Payer: BC Managed Care – PPO | Admitting: Internal Medicine

## 2019-05-11 ENCOUNTER — Other Ambulatory Visit: Payer: Self-pay

## 2019-05-11 ENCOUNTER — Encounter (INDEPENDENT_AMBULATORY_CARE_PROVIDER_SITE_OTHER): Payer: Self-pay | Admitting: Internal Medicine

## 2019-05-11 VITALS — BP 140/80 | HR 72 | Ht 68.0 in | Wt 183.8 lb

## 2019-05-11 DIAGNOSIS — I10 Essential (primary) hypertension: Secondary | ICD-10-CM

## 2019-05-11 DIAGNOSIS — E559 Vitamin D deficiency, unspecified: Secondary | ICD-10-CM | POA: Diagnosis not present

## 2019-05-11 DIAGNOSIS — E782 Mixed hyperlipidemia: Secondary | ICD-10-CM

## 2019-05-11 DIAGNOSIS — R7989 Other specified abnormal findings of blood chemistry: Secondary | ICD-10-CM

## 2019-05-11 DIAGNOSIS — N529 Male erectile dysfunction, unspecified: Secondary | ICD-10-CM | POA: Insufficient documentation

## 2019-05-11 DIAGNOSIS — Z0001 Encounter for general adult medical examination with abnormal findings: Secondary | ICD-10-CM | POA: Diagnosis not present

## 2019-05-11 DIAGNOSIS — Z125 Encounter for screening for malignant neoplasm of prostate: Secondary | ICD-10-CM

## 2019-05-11 DIAGNOSIS — Z131 Encounter for screening for diabetes mellitus: Secondary | ICD-10-CM

## 2019-05-11 DIAGNOSIS — Z1159 Encounter for screening for other viral diseases: Secondary | ICD-10-CM

## 2019-05-11 HISTORY — DX: Male erectile dysfunction, unspecified: N52.9

## 2019-05-11 HISTORY — DX: Vitamin D deficiency, unspecified: E55.9

## 2019-05-11 NOTE — Patient Instructions (Signed)
[email or web address]  Melatonin 1mg capsules (#60 in a bottle). 

## 2019-05-11 NOTE — Progress Notes (Signed)
Chief Complaint: This 63 year old man comes in for an annual physical exam and to address his chronic conditions which are described below. HPI: He has a history of coronary artery disease and had a telemedicine visit with the cardiologist in July.  Everything seemed to be stable but his metoprolol was increased to 50 mg daily.  He denies any chest pain, dyspnea, palpitations or new limb weakness. He is compliant with his cardiac medications.  He also takes Plavix. He takes vitamin D3 supplementation for vitamin D deficiency. He also takes testosterone therapy once a week for erectile dysfunction and decreased libido.  His last injection was 5 days ago.  I did prescribe Cialis for him but this did not help his erectile dysfunction. He continues to take statin therapy in the face of coronary artery disease without any myalgias. He also has acid reflux disease but he wonders if he can reduce the dose to once a day, which I agree with.  Past Medical History:  Diagnosis Date  . Anxiety and depression   . Chest pain 2005   03/2012: neg nuclear stress.  2005- Cardiac cath:20-40% lesions in the LAD, circumflex and RCA with normal EF  . Degenerative joint disease   . Erectile dysfunction 05/11/2019  . Hyperlipidemia   . Tobacco abuse    40 pack year total consumption  . Vitamin D deficiency disease 05/11/2019   Past Surgical History:  Procedure Laterality Date  . APPENDECTOMY    . CORONARY STENT INTERVENTION N/A 12/26/2018   Procedure: CORONARY STENT INTERVENTION;  Surgeon: Lennette Bihari, MD;  Location: [facility] INVASIVE CV LAB;  Service: Cardiovascular;  Laterality: N/A;  . LEFT HEART CATH AND CORONARY ANGIOGRAPHY N/A 12/26/2018   Procedure: LEFT HEART CATH AND CORONARY ANGIOGRAPHY;  Surgeon: Lennette Bihari, MD;  Location: [facility] INVASIVE CV LAB;  Service: Cardiovascular;  Laterality: N/A;     Social History   Social History Narrative   Widower. Lives on his own.Retired.        Allergies: No  Known Allergies   Current Meds  Medication Sig  . aspirin EC 81 MG EC tablet Take 1 tablet (81 mg total) by mouth daily.  Marland Kitchen atorvastatin (LIPITOR) 80 MG tablet Take 1 tablet (80 mg total) by mouth daily at 6 PM.  . Cholecalciferol (VITAMIN D) 125 MCG (5000 UT) CAPS Take 5,000 Units by mouth daily.  . clopidogrel (PLAVIX) 75 MG tablet Take 1 tablet (75 mg total) by mouth daily.  Marland Kitchen losartan (COZAAR) 50 MG tablet Take 1 tablet (50 mg total) by mouth daily.  . metoprolol succinate (TOPROL-XL) 50 MG 24 hr tablet Take 1 tablet (50 mg total) by mouth daily.  . nitroGLYCERIN (NITROSTAT) 0.4 MG SL tablet Place 1 tablet (0.4 mg total) under the tongue every 5 (five) minutes x 3 doses as needed for chest pain.  . pantoprazole (PROTONIX) 40 MG tablet Take 40 mg by mouth daily.   Marland Kitchen testosterone cypionate (DEPOTESTOSTERONE CYPIONATE) 200 MG/ML injection Inject 0.5 mLs into the muscle once a week. Wednesday     Nutrition He usually eats only once a day.  Sleep Sleep is quite broken and poor, he only gets 4 to 6 hours of sleep every night and this is usually interrupted.  Exercise No programmed exercise.  He walks the dog 15 minutes every day and tries to keep active.  Bio-identical hormones Testosterone therapy is being used off label for symptoms of testosterone deficiency and benefits that it produces based on several  studies.  These benefits include decreasing body fat, increasing in lean muscle mass and increasing in bone density.  There is improvement of memory, cognition.  There is improvement in exercise tolerance and endurance.  Testosterone therapy has also been shown to be protective against coronary artery disease, cerebrovascular disease, diabetes, hypertension and degenerative joint disease. I have discussed with the patient the FDA warnings regarding testosterone therapy, benefits and side effects and modes of administration as well as monitoring blood levels and side effects  on a regular  basis The patient is agreeable that testosterone therapy should be an integral part of his/her wellness,quality of life and prevention of chronic disease. His last injection was 5 days ago.  ONG:EXBMWROS:apart from the symptoms mentioned above,there are no other symptoms referable to all systems reviewed.  Physical Exam: Blood pressure 140/80, pulse 72, height 5\' 8"  (1.727 m), weight 83.4 kg. Vitals with BMI 05/11/2019 02/11/2019 12/31/2018  Height 5\' 8"  5\' 6"  5\' 6"   Weight 183 lbs 13 oz 187 lbs 179 lbs  BMI 27.95 30.2 28.91  Systolic 140 122 [id number]  Diastolic 80 70 63  Pulse 72 68 87      He looks systemically well.  He is overweight. General: Alert, cooperative, and appears to be the stated age.No pallor.  No jaundice.  No clubbing. Head: Normocephalic Eyes: Sclera white, pupils equal and reactive to light, red reflex x 2,  Ears: Normal bilaterally Oral cavity: Lips, mucosa, and tongue normal: Teeth and gums normal Neck: No adenopathy, supple, symmetrical, trachea midline, and thyroid does not appear enlarged Respiratory: Clear to auscultation bilaterally.No wheezing, crackles or bronchial breathing. Cardiovascular: Heart sounds are present and appear to be normal without murmurs or added sounds.  No carotid bruits.  Peripheral pulses are present and equal bilaterally.: Soft, nontender, Gastrointestinal:positive bowel sounds, no hepatosplenomegaly.  No masses felt.No tenderness. Skin: Clear, No rashes noted.No worrisome skin lesions seen. Neurological: Grossly intact without focal findings, cranial nerves II through XII intact, muscle strength equal bilaterally Musculoskeletal: No acute joint abnormalities noted.Full range of movement noted with joints. Psychiatric: Affect appropriate, non-anxious.    Assessment  1. Essential hypertension   2. Erectile dysfunction, unspecified erectile dysfunction type   3. Mixed hyperlipidemia   4. Vitamin D deficiency disease   5. Special screening for  malignant neoplasm of prostate   6. Encounter for hepatitis C screening test for low risk patient   7. Screening for diabetes mellitus   8. Encounter for general adult medical examination with abnormal findings   9. Low testosterone     Tests Ordered:   Orders Placed This Encounter  Procedures  . CBC  . COMPLETE METABOLIC PANEL WITH GFR  . Hemoglobin A1c  . Hepatitis C antibody  . Lipid panel  . PSA  . T3, free  . TSH  . Testosterone Total,Free,Bio, Males  . VITAMIN D 25 Hydroxy (Vit-D Deficiency, Fractures)     Plan  1. I recommended colonoscopy but he declined.  He realizes that if the colonoscopy is abnormal, he may be liable for large bill. 2. Blood work is ordered as above. 3. I told him that it is possible we may need to increase his testosterone therapy to twice a week to achieve resolution of his erectile dysfunction although he realizes that coronary artery disease puts him at high risk for lack of improvement. 4. I also discussed with him some back stretching exercises as he has had low back problems chronically. 5. He will continue with all  medications for his chronic conditions above and further recommendations will depend on blood results. 6. Follow-up in 3 months. 7. Today, in addition to a preventative visit, I performed an office visit to address his chronic conditions above.        Brettany Sydney C Vyncent Overby   05/11/2019, 9:55 AM

## 2019-05-12 LAB — TESTOSTERONE TOTAL,FREE,BIO, MALES
Albumin: 4.2 g/dL (ref 3.6–5.1)
Sex Hormone Binding: 41 nmol/L (ref 22–77)
Testosterone, Bioavailable: 234.7 ng/dL (ref >=110.0)
Testosterone, Free: 121.8 pg/mL (ref 46.0–224.0)
Testosterone: 937 ng/dL — ABNORMAL HIGH (ref 250–827)

## 2019-05-12 LAB — CBC
HCT: 47.9 % (ref 38.5–50.0)
Hemoglobin: 16.8 g/dL (ref 13.2–17.1)
MCH: 34.8 pg — ABNORMAL HIGH (ref 27.0–33.0)
MCHC: 35.1 g/dL (ref 32.0–36.0)
MCV: 99.2 fL (ref 80.0–100.0)
MPV: 10.8 fL (ref 7.5–12.5)
Platelets: 193 10*3/uL (ref 140–400)
RBC: 4.83 10*6/uL (ref 4.20–5.80)
RDW: 12.1 % (ref 11.0–15.0)
WBC: 7 10*3/uL (ref 3.8–10.8)

## 2019-05-12 LAB — COMPLETE METABOLIC PANEL WITH GFR
AG Ratio: 2 (calc) (ref 1.0–2.5)
ALT: 14 U/L (ref 9–46)
AST: 16 U/L (ref 10–35)
Albumin: 4.2 g/dL (ref 3.6–5.1)
Alkaline phosphatase (APISO): 73 U/L (ref 35–144)
BUN/Creatinine Ratio: 12 (calc) (ref 6–22)
BUN: 18 mg/dL (ref 7–25)
CO2: 25 mmol/L (ref 20–32)
Calcium: 9.3 mg/dL (ref 8.6–10.3)
Chloride: 105 mmol/L (ref 98–110)
Creat: 1.47 mg/dL — ABNORMAL HIGH (ref 0.70–1.25)
GFR, Est African American: 58 mL/min/{1.73_m2} — ABNORMAL LOW (ref >=60)
GFR, Est Non African American: 50 mL/min/{1.73_m2} — ABNORMAL LOW (ref >=60)
Globulin: 2.1 g/dL (calc) (ref 1.9–3.7)
Glucose, Bld: 103 mg/dL — ABNORMAL HIGH (ref 65–99)
Potassium: 4.4 mmol/L (ref 3.5–5.3)
Sodium: 140 mmol/L (ref 135–146)
Total Bilirubin: 0.5 mg/dL (ref 0.2–1.2)
Total Protein: 6.3 g/dL (ref 6.1–8.1)

## 2019-05-12 LAB — LIPID PANEL
Cholesterol: 129 mg/dL (ref <200)
HDL: 61 mg/dL (ref >=40)
LDL Cholesterol (Calc): 53 mg/dL (calc)
Non-HDL Cholesterol (Calc): 68 mg/dL (calc) (ref <130)
Total CHOL/HDL Ratio: 2.1 (calc) (ref <5.0)
Triglycerides: 66 mg/dL (ref <150)

## 2019-05-12 LAB — VITAMIN D 25 HYDROXY (VIT D DEFICIENCY, FRACTURES): Vit D, 25-Hydroxy: 39 ng/mL (ref 30–100)

## 2019-05-12 LAB — HEMOGLOBIN A1C
Hgb A1c MFr Bld: 5.4 % of total Hgb (ref <5.7)
Mean Plasma Glucose: 108 (calc)
eAG (mmol/L): 6 (calc)

## 2019-05-12 LAB — HEPATITIS C ANTIBODY
Hepatitis C Ab: NONREACTIVE
SIGNAL TO CUT-OFF: 0.05 (ref <1.00)

## 2019-05-12 LAB — PSA: PSA: 0.7 ng/mL (ref <=4.0)

## 2019-05-12 LAB — TSH: TSH: 1.38 mIU/L (ref 0.40–4.50)

## 2019-05-12 LAB — T3, FREE: T3, Free: 3.7 pg/mL (ref 2.3–4.2)

## 2019-05-12 NOTE — Progress Notes (Signed)
Although your testosterone level was in a good range, it could be higher which might actually help your erectile dysfunction.  Therefore, I want you to start taking testosterone twice a week at the same dose of 0.5 mL each time.  Take 2 days of the week and stick to those 2 days until I see you next time.  If you have any questions, do not hesitate to contact me through my chart.

## 2019-05-12 NOTE — Progress Notes (Signed)
Your kidney function is stable.  Make sure you drink plenty of water every day.  Your vitamin D levels are suboptimal.  Make sure you take vitamin D3 a dose of 10,000 units daily now.  Be well!

## 2019-05-13 ENCOUNTER — Telehealth (INDEPENDENT_AMBULATORY_CARE_PROVIDER_SITE_OTHER): Payer: Self-pay | Admitting: Internal Medicine

## 2019-05-13 ENCOUNTER — Other Ambulatory Visit (INDEPENDENT_AMBULATORY_CARE_PROVIDER_SITE_OTHER): Payer: Self-pay | Admitting: Internal Medicine

## 2019-05-13 MED ORDER — TESTOSTERONE CYPIONATE 200 MG/ML IM SOLN: 100.0000 mg | INTRAMUSCULAR | 1 refills | Status: DC

## 2019-05-13 NOTE — Telephone Encounter (Signed)
Done

## 2019-05-21 ENCOUNTER — Telehealth (INDEPENDENT_AMBULATORY_CARE_PROVIDER_SITE_OTHER): Payer: Self-pay

## 2019-05-21 ENCOUNTER — Other Ambulatory Visit: Payer: Self-pay | Admitting: *Deleted

## 2019-05-21 DIAGNOSIS — Z20822 Contact with and (suspected) exposure to covid-19: Secondary | ICD-10-CM

## 2019-05-21 NOTE — Telephone Encounter (Signed)
Pt called from home showing postive signs  & symptoms of covid. Pt is having ,coughing, Headache , fever, tightness in chest. Pt was exposed 2 days ago by a friend. The friend test came back 05/20/19 as POS -COVID.   Pt was screened with check-in questions. Pt was instructed to go to the COVID testing site in [place]. Instructed that he is to go directly there for testing and the in go straight home to quarinetine in home for 14 days until COVID test comes back and COVID teams give him results.  Pt was instructed to make sure he is doing safe hand washing, cleaning his home with proper disinfectants. To make sure he is wearing his mask when out in community & when in present of anyone.

## 2019-05-23 LAB — NOVEL CORONAVIRUS, NAA: SARS-CoV-2, NAA: DETECTED — AB

## 2019-05-25 ENCOUNTER — Ambulatory Visit: Payer: Self-pay

## 2019-05-25 NOTE — Telephone Encounter (Signed)
Provided covid-19 lab results.  Provided care advice to Pt.voiced understanding.

## 2019-06-24 ENCOUNTER — Other Ambulatory Visit: Payer: Self-pay | Admitting: Student

## 2019-06-25 ENCOUNTER — Other Ambulatory Visit (INDEPENDENT_AMBULATORY_CARE_PROVIDER_SITE_OTHER): Payer: Self-pay | Admitting: Internal Medicine

## 2019-08-17 ENCOUNTER — Other Ambulatory Visit: Payer: Self-pay

## 2019-08-17 ENCOUNTER — Encounter (INDEPENDENT_AMBULATORY_CARE_PROVIDER_SITE_OTHER): Payer: Self-pay | Admitting: Internal Medicine

## 2019-08-17 ENCOUNTER — Ambulatory Visit (INDEPENDENT_AMBULATORY_CARE_PROVIDER_SITE_OTHER): Payer: 59 | Admitting: Internal Medicine

## 2019-08-17 VITALS — BP 140/84 | HR 81 | Ht 68.0 in | Wt 196.0 lb

## 2019-08-17 DIAGNOSIS — R7989 Other specified abnormal findings of blood chemistry: Secondary | ICD-10-CM

## 2019-08-17 DIAGNOSIS — E559 Vitamin D deficiency, unspecified: Secondary | ICD-10-CM | POA: Diagnosis not present

## 2019-08-17 DIAGNOSIS — N529 Male erectile dysfunction, unspecified: Secondary | ICD-10-CM

## 2019-08-17 DIAGNOSIS — I10 Essential (primary) hypertension: Secondary | ICD-10-CM | POA: Diagnosis not present

## 2019-08-17 MED ORDER — TADALAFIL 20 MG PO TABS: 10.0000 mg | ORAL_TABLET | ORAL | 3 refills | Status: DC | PRN

## 2019-08-17 NOTE — Progress Notes (Signed)
Metrics: Intervention Frequency ACO  Documented Smoking Status Yearly  Screened one or more times in 24 months  Cessation Counseling or  Active cessation medication Past 24 months  Past 24 months   Guideline developer: UpToDate (See UpToDate for funding source) Date Released: 2014       Wellness Office Visit  Subjective:  Patient ID: Michael Cortez, male    DOB: 19-Oct-1955  Age: 64 y.o. MRN: [id number]  CC: This man comes in for follow-up of low testosterone, vitamin D deficiency and hypertension as well as erectile dysfunction. HPI  He tells me that increasing the testosterone to twice a week that we did on the last visit does seem to have helped but he tends to get erections in the middle of the night which is inconvenient. He has not been taking vitamin D3 supplementation on a regular basis. He continues to take antihypertensive therapy for his hypertension. He denies any chest pain, dyspnea, palpitations or limb weakness. I see that he did have COVID-19 disease a couple of months ago and made a full recovery. Past Medical History:  Diagnosis Date  . Anxiety and depression   . Chest pain 2005   03/2012: neg nuclear stress.  2005- Cardiac cath:20-40% lesions in the LAD, circumflex and RCA with normal EF  . Degenerative joint disease   . Erectile dysfunction 05/11/2019  . Hyperlipidemia   . Tobacco abuse    40 pack year total consumption  . Vitamin D deficiency disease 05/11/2019      Family History  Problem Relation Age of Onset  . Coronary artery disease Father 90       Fatal myocardial infarction    Social History   Social History Narrative   Widower. Lives on his own.Retired.   Social History   Tobacco Use  . Smoking status: Former Smoker    Packs/day: 1.00    Years: 40.00    Pack years: 40.00  . Smokeless tobacco: Never Used  Substance Use Topics  . Alcohol use: No    Current Meds  Medication Sig  . amLODipine (NORVASC) 5 MG tablet TAKE 1 TABLET  DAILY  . aspirin EC 81 MG EC tablet Take 1 tablet (81 mg total) by mouth daily.  Marland Kitchen atorvastatin (LIPITOR) 80 MG tablet Take 1 tablet (80 mg total) by mouth daily at 6 PM.  . Cholecalciferol (VITAMIN D) 125 MCG (5000 UT) CAPS Take 5,000 Units by mouth daily.  . clopidogrel (PLAVIX) 75 MG tablet Take 1 tablet (75 mg total) by mouth daily.  Marland Kitchen losartan (COZAAR) 50 MG tablet Take 1 tablet (50 mg total) by mouth daily.  . metoprolol succinate (TOPROL-XL) 50 MG 24 hr tablet Take 1 tablet (50 mg total) by mouth daily.  . nitroGLYCERIN (NITROSTAT) 0.4 MG SL tablet Place 1 tablet (0.4 mg total) under the tongue every 5 (five) minutes x 3 doses as needed for chest pain.  . pantoprazole (PROTONIX) 40 MG tablet TAKE 1 TABLET TWICE A DAY BY MOUTH  . testosterone cypionate (DEPOTESTOSTERONE CYPIONATE) 200 MG/ML injection Inject 0.5 mLs (100 mg total) into the muscle 2 (two) times a week. Wednesday     Nutrition  Not consistent at the present time and he has gained weight. Sleep  Adequate.  Exercise  None regular. Bio Identical Hormones  Testosterone therapy is being used off label for symptoms of testosterone deficiency and benefits that it produces based on several studies.  These benefits include decreasing body fat, increasing in lean muscle mass  and increasing in bone density.  There is improvement of memory, cognition.  There is improvement in exercise tolerance and endurance.  Testosterone therapy has also been shown to be protective against coronary artery disease, cerebrovascular disease, diabetes, hypertension and degenerative joint disease. I have discussed with the patient the FDA warnings regarding testosterone therapy, benefits and side effects and modes of administration as well as monitoring blood levels and side effects  on a regular basis The patient is agreeable that testosterone therapy should be an integral part of his/her wellness,quality of life and prevention of chronic disease.   Objective:   Today's Vitals: BP 140/84   Pulse 81   Ht _0  (1.727 m)   Wt 196 lb (88.9 kg)   SpO2 99%   BMI 29.80 kg/m  Vitals with BMI 08/17/2019 05/11/2019 02/11/2019  Height _1  _2  _3   Weight 196 lbs 183 lbs 13 oz 187 lbs  BMI 29.81 46.28 63.8  Systolic 177 116 579  Diastolic 84 80 70  Pulse 81 72 68     Physical Exam   He looks systemically well but he has gained 13 pounds compared to September.  Blood pressure also is slightly elevated .  He is alert and orientated without any focal neurological signs.    Assessment   1. Low testosterone   2. Vitamin D deficiency disease   3. Erectile dysfunction, unspecified erectile dysfunction type   4. Essential hypertension       Tests ordered Orders Placed This Encounter  Procedures  . Testosterone Total,Free,Bio, Males  . CMP with eGFR([facility])     Plan: 1. Blood work is ordered as above. 2. I have sent a prescription for Cialis to see if this will also help his erectile dysfunction and I have told him to adjust the timing of taking testosterone injections to the evenings instead of the mornings to see if this will make a difference. 3. Further recommendations will depend on blood results and we will continue with antihypertensive therapy and I have encouraged him to make sure he takes vitamin D3 at a dose of 10,000 units daily. 4. Follow-up in 3 months time.   Meds ordered this encounter  Medications  . tadalafil (CIALIS) 20 MG tablet    Sig: Take 0.5 tablets (10 mg total) by mouth every other day as needed for erectile dysfunction.    Dispense:  5 tablet    Refill:  3    Anberlyn Feimster Luther Parody, MD

## 2019-08-18 LAB — TESTOSTERONE TOTAL,FREE,BIO, MALES
Albumin: 4 g/dL (ref 3.6–5.1)
Sex Hormone Binding: 36 nmol/L (ref 22–77)
Testosterone, Bioavailable: 436.1 ng/dL (ref >=110.0)
Testosterone, Free: 237.1 pg/mL — ABNORMAL HIGH (ref 46.0–224.0)
Testosterone: 1373 ng/dL — ABNORMAL HIGH (ref 250–827)

## 2019-08-18 LAB — EXTRA LAV TOP TUBE

## 2019-08-25 ENCOUNTER — Telehealth: Payer: Self-pay | Admitting: *Deleted

## 2019-08-25 NOTE — Telephone Encounter (Signed)
A message was left, re: his follow up visit. 

## 2019-09-15 DIAGNOSIS — I2510 Atherosclerotic heart disease of native coronary artery without angina pectoris: Secondary | ICD-10-CM | POA: Insufficient documentation

## 2019-09-15 DIAGNOSIS — Z7189 Other specified counseling: Secondary | ICD-10-CM | POA: Insufficient documentation

## 2019-09-15 DIAGNOSIS — N182 Chronic kidney disease, stage 2 (mild): Secondary | ICD-10-CM | POA: Insufficient documentation

## 2019-09-15 DIAGNOSIS — I5022 Chronic systolic (congestive) heart failure: Secondary | ICD-10-CM | POA: Insufficient documentation

## 2019-09-15 NOTE — Progress Notes (Signed)
Cardiology Office Note   Date:  09/16/2019   ID:  Michael Cortez, DOB 1956-05-03, MRN [id number]  PCP:  Doree Albee, MD  Cardiologist:   Minus Breeding, MD   Chief Complaint  Patient presents with  . Coronary Artery Disease      History of Present Illness: Michael Cortez is a 64 y.o. male who was admited in May of 2020 with a NSTEMI.  He had a cath with results listed below.  He had a mild/mod reduced EFof 45 - 50%.  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     He presents for follow up.  Since I last saw him he has done well.  He has had none of the discomfort that was his previous angina.  He does have some mild fleeting discomfort occasionally.  It is of focal discomfort that seems to be with certain movements.  However, he got married last month and had to move and was able to carry boxes and do other things without bringing on any of the symptoms that he had previously.  He does not have any new shortness of breath, PND or orthopnea.  He said no palpitations, presyncope or syncope.  He has had no weight gain or edema.   Past Medical History:  Diagnosis Date  . Anxiety and depression   . Chest pain 2005   03/2012: neg nuclear stress.  2005- Cardiac cath:20-40% lesions in the LAD, circumflex and RCA with normal EF  . CVA (cerebral vascular accident) ([place])   . Degenerative joint disease   . Erectile dysfunction 05/11/2019  . Hyperlipidemia   . Tobacco abuse    40 pack year total consumption  . Vitamin D deficiency disease 05/11/2019    Past Surgical History:  Procedure Laterality Date  . APPENDECTOMY    . CORONARY STENT INTERVENTION N/A 12/26/2018   Procedure: CORONARY STENT INTERVENTION;  Surgeon: Troy Sine, MD;  Location: [facility];  Service: Cardiovascular;  Laterality: N/A;  . LEFT HEART CATH AND CORONARY ANGIOGRAPHY N/A 12/26/2018   Procedure: LEFT HEART CATH AND CORONARY ANGIOGRAPHY;  Surgeon: Troy Sine, MD;  Location: [facility];  Service: Cardiovascular;  Laterality: N/A;     Current Outpatient Medications  Medication Sig Dispense Refill  . amLODipine (NORVASC) 5 MG tablet TAKE 1 TABLET DAILY 90 tablet 0  . aspirin EC 81 MG EC tablet Take 1 tablet (81 mg total) by mouth daily.    Marland Kitchen atorvastatin (LIPITOR) 80 MG tablet Take 1 tablet (80 mg total) by mouth daily at 6 PM. 90 tablet 0  . Cholecalciferol (VITAMIN D) 125 MCG (5000 UT) CAPS Take 5,000 Units by mouth daily.    . clopidogrel (PLAVIX) 75 MG tablet Take 1 tablet (75 mg total) by mouth daily. 30 tablet 0  . losartan (COZAAR) 50 MG tablet Take 1 tablet (50 mg total) by mouth daily. 90 tablet 3  . metoprolol succinate (TOPROL-XL) 50 MG 24 hr tablet Take 1 tablet (50 mg total) by mouth daily. 90 tablet 3  . nitroGLYCERIN (NITROSTAT) 0.4 MG SL tablet Place 1 tablet (0.4 mg total) under the tongue every 5 (five) minutes x 3 doses as needed for chest pain. 30 tablet 5  . pantoprazole (PROTONIX) 40 MG tablet TAKE 1 TABLET TWICE A DAY BY MOUTH 180 tablet 0  . tadalafil (CIALIS) 20 MG tablet Take 0.5 tablets (10 mg total) by mouth every other day as  needed for erectile dysfunction. 5 tablet 3  . testosterone cypionate (DEPOTESTOSTERONE CYPIONATE) 200 MG/ML injection Inject 0.5 mLs (100 mg total) into the muscle 2 (two) times a week. Wednesday 10 mL 1   No current facility-administered medications for this visit.    Allergies:   Patient has no known allergies.     ROS:  Please see the history of present illness.   Otherwise, review of systems are positive for none.   All other systems are reviewed and negative.    PHYSICAL EXAM: VS:  BP (!) 162/90   Pulse 78   Ht 5\' 8"  (1.727 m)   Wt 201 lb (91.2 kg)   BMI 30.56 kg/m  , BMI Body mass index is 30.56 kg/m. GENERAL:  Well appearing HEENT:  Pupils equal round and reactive, fundi not visualized, oral mucosa unremarkable NECK:  No jugular venous distention, waveform within normal limits, carotid  upstroke brisk and symmetric, soft right carotid  bruits, no thyromegaly LYMPHATICS:  No cervical, inguinal adenopathy LUNGS:  Clear to auscultation bilaterally BACK:  No CVA tenderness CHEST:  Unremarkable HEART:  PMI not displaced or sustained,S1 and S2 within normal limits, no S3, no S4, no clicks, no rubs, no murmurs ABD:  Flat, positive bowel sounds normal in frequency in pitch, no bruits, no rebound, no guarding, no midline pulsatile mass, no hepatomegaly, no splenomegaly EXT:  2 plus pulses throughout, no edema, no cyanosis no clubbing   EKG:  EKG is ordered today. The ekg ordered today demonstrates sinus rhythm, rate 78, axis within normal limits, intervals within normal limits, no acute ST-T wave changes.   Recent Labs: 05/11/2019: ALT 14; BUN 18; Creat 1.47; Hemoglobin 16.8; Platelets 193; Potassium 4.4; Sodium 140; TSH 1.38    Lipid Panel    Component Value Date/Time   CHOL 129 05/11/2019 0950   CHOL 207 (H) 03/12/2013 1615   TRIG 66 05/11/2019 0950   TRIG 87 03/12/2013 1615   HDL 61 05/11/2019 0950   HDL 47 03/12/2013 1615   CHOLHDL 2.1 05/11/2019 0950   VLDL 22 12/26/2018 0825   LDLCALC 53 05/11/2019 0950   LDLCALC 143 (H) 03/12/2013 1615      Wt Readings from Last 3 Encounters:  09/16/19 201 lb (91.2 kg)  08/17/19 196 lb (88.9 kg)  05/11/19 183 lb 12.8 oz (83.4 kg)      Other studies Reviewed: Additional studies/ records that were reviewed today include: Labs. Review of the above records demonstrates:  Please see elsewhere in the note.     ASSESSMENT AND PLAN:  CAD   In his Plavix to get his teeth removed.  Ultimately I would like him to be on Plavix alone because of his previous stroke discontinuing the aspirin.  We had a discussion about how to do that after his teeth were removed.   Hypertension  He says his blood pressure is never this elevated and that it is usually in the 130s over 60s.  He will keep an eye on this and no further change in meds  at this point.    Hyperlipidemia Goal will be an LDL less than 70.   His LDL was 53.  No change in therapy.    Ischemic cardiomyopathy His EF was slightly low previously.  No change in therapy.  I will assess this with an echo in the future.  CKD II   creatinine is 1.47.  We talked about this today.   BRUIT: His carotid bruit deserves evaluation with a  Doppler.  He did have a previous CVA but no, carotid stenosis.  COVID EDUCATION: We talked about the vaccine.   Current medicines are reviewed at length with the patient today.  The patient does not have concerns regarding medicines.  The following changes have been made:  no change  Labs/ tests ordered today include:   Orders Placed This Encounter  Procedures  . US Carotid Duplex Bilateral  . EKG 12-Lead     Disposition:   FU with me in 6 months    Signed, Rollene Rotunda, MD  09/16/2019 3:39 PM    [facility]

## 2019-09-16 ENCOUNTER — Ambulatory Visit: Payer: 59 | Admitting: Cardiology

## 2019-09-16 ENCOUNTER — Other Ambulatory Visit: Payer: Self-pay

## 2019-09-16 ENCOUNTER — Encounter: Payer: Self-pay | Admitting: Cardiology

## 2019-09-16 VITALS — BP 162/90 | HR 78 | Ht 68.0 in | Wt 201.0 lb

## 2019-09-16 DIAGNOSIS — R0989 Other specified symptoms and signs involving the circulatory and respiratory systems: Secondary | ICD-10-CM

## 2019-09-16 DIAGNOSIS — I10 Essential (primary) hypertension: Secondary | ICD-10-CM

## 2019-09-16 DIAGNOSIS — I5022 Chronic systolic (congestive) heart failure: Secondary | ICD-10-CM

## 2019-09-16 DIAGNOSIS — Z7189 Other specified counseling: Secondary | ICD-10-CM

## 2019-09-16 DIAGNOSIS — I2510 Atherosclerotic heart disease of native coronary artery without angina pectoris: Secondary | ICD-10-CM | POA: Diagnosis not present

## 2019-09-16 DIAGNOSIS — E785 Hyperlipidemia, unspecified: Secondary | ICD-10-CM

## 2019-09-16 DIAGNOSIS — N182 Chronic kidney disease, stage 2 (mild): Secondary | ICD-10-CM

## 2019-09-16 NOTE — Patient Instructions (Addendum)
Medication Instructions:  The current medical regimen is effective;  continue present plan and medications.  *If you need a refill on your cardiac medications before your next appointment, please call your pharmacy*  Testing/Procedures: Your physician has requested that you have a carotid duplex. This test is an ultrasound of the carotid arteries in your neck. It looks at blood flow through these arteries that supply the brain with blood. Allow one hour for this exam. There are no restrictions or special instructions.  Check in at the [facility] at 11:15 am.  Follow-Up: At [facility], you and your health needs are our priority.  As part of our continuing mission to provide you with exceptional heart care, we have created designated Provider Care Teams.  These Care Teams include your primary Cardiologist (physician) and Advanced Practice Providers (APPs -  Physician Assistants and Nurse Practitioners) who all work together to provide you with the care you need, when you need it.  Your next appointment:   6 month(s)  The format for your next appointment:   In Person  Provider:   Rollene Rotunda, MD  Thank you for choosing [facility]!!     You may hold your Plavix before your dental procedure in May.  After your procedure, restart the Plavix and stop your Asprin.

## 2019-09-18 ENCOUNTER — Other Ambulatory Visit (INDEPENDENT_AMBULATORY_CARE_PROVIDER_SITE_OTHER): Payer: Self-pay | Admitting: Internal Medicine

## 2019-09-21 ENCOUNTER — Other Ambulatory Visit: Payer: Self-pay | Admitting: Student

## 2019-09-21 ENCOUNTER — Other Ambulatory Visit (INDEPENDENT_AMBULATORY_CARE_PROVIDER_SITE_OTHER): Payer: Self-pay | Admitting: Internal Medicine

## 2019-09-21 NOTE — Telephone Encounter (Signed)
Patient is a Environmental health practitioner patient

## 2019-09-23 ENCOUNTER — Ambulatory Visit (HOSPITAL_COMMUNITY)
Admission: RE | Admit: 2019-09-23 | Discharge: 2019-09-23 | Disposition: A | Discharge status: Home / Self Care | Payer: 59 | Source: Ambulatory Visit | Attending: Cardiology | Admitting: Cardiology

## 2019-09-23 ENCOUNTER — Other Ambulatory Visit: Payer: Self-pay

## 2019-09-23 ENCOUNTER — Other Ambulatory Visit (INDEPENDENT_AMBULATORY_CARE_PROVIDER_SITE_OTHER): Payer: Self-pay | Admitting: Internal Medicine

## 2019-09-23 ENCOUNTER — Telehealth (INDEPENDENT_AMBULATORY_CARE_PROVIDER_SITE_OTHER): Payer: Self-pay | Admitting: Internal Medicine

## 2019-09-23 DIAGNOSIS — R0989 Other specified symptoms and signs involving the circulatory and respiratory systems: Secondary | ICD-10-CM | POA: Diagnosis present

## 2019-09-23 MED ORDER — CLOPIDOGREL BISULFATE 75 MG PO TABS: 75.0000 mg | ORAL_TABLET | Freq: Every day | ORAL | 3 refills | Status: DC

## 2019-09-23 NOTE — Telephone Encounter (Signed)
Done

## 2019-09-28 ENCOUNTER — Telehealth: Payer: Self-pay | Admitting: Cardiology

## 2019-09-28 DIAGNOSIS — I6523 Occlusion and stenosis of bilateral carotid arteries: Secondary | ICD-10-CM

## 2019-09-28 NOTE — Telephone Encounter (Signed)
Spoke with pt, aware of carotid results. Order placed

## 2019-09-28 NOTE — Telephone Encounter (Signed)
New Message  Patient is calling in for his results of his ultrasound. Please give patient a call back to assist.

## 2019-10-08 ENCOUNTER — Telehealth (INDEPENDENT_AMBULATORY_CARE_PROVIDER_SITE_OTHER): Payer: Self-pay

## 2019-10-08 NOTE — Telephone Encounter (Signed)
Ok, sounds good.

## 2019-10-08 NOTE — Telephone Encounter (Signed)
Michael Cortez is calling stating that he needs a refill on his testosterone needles he is taking 2 shots now will pick up Rx on Monday

## 2019-10-21 ENCOUNTER — Other Ambulatory Visit (INDEPENDENT_AMBULATORY_CARE_PROVIDER_SITE_OTHER): Payer: Self-pay | Admitting: Internal Medicine

## 2019-11-16 ENCOUNTER — Encounter (INDEPENDENT_AMBULATORY_CARE_PROVIDER_SITE_OTHER): Payer: Self-pay | Admitting: Internal Medicine

## 2019-11-16 ENCOUNTER — Other Ambulatory Visit: Payer: Self-pay

## 2019-11-16 ENCOUNTER — Ambulatory Visit (INDEPENDENT_AMBULATORY_CARE_PROVIDER_SITE_OTHER): Payer: 59 | Admitting: Internal Medicine

## 2019-11-16 VITALS — BP 144/88 | HR 78 | Temp 98.4°F | Resp 18 | Ht 68.0 in | Wt 204.0 lb

## 2019-11-16 DIAGNOSIS — R7989 Other specified abnormal findings of blood chemistry: Secondary | ICD-10-CM | POA: Diagnosis not present

## 2019-11-16 DIAGNOSIS — I10 Essential (primary) hypertension: Secondary | ICD-10-CM | POA: Diagnosis not present

## 2019-11-16 DIAGNOSIS — E782 Mixed hyperlipidemia: Secondary | ICD-10-CM

## 2019-11-16 DIAGNOSIS — N529 Male erectile dysfunction, unspecified: Secondary | ICD-10-CM

## 2019-11-16 MED ORDER — AMLODIPINE BESYLATE 5 MG PO TABS: 5.0000 mg | ORAL_TABLET | Freq: Every day | ORAL | 0 refills | Status: DC

## 2019-11-16 MED ORDER — CLOPIDOGREL BISULFATE 75 MG PO TABS: 75.0000 mg | ORAL_TABLET | Freq: Every day | ORAL | 1 refills | Status: DC

## 2019-11-16 MED ORDER — METOPROLOL SUCCINATE ER 50 MG PO TB24: 50.0000 mg | ORAL_TABLET | Freq: Every day | ORAL | 1 refills | Status: DC

## 2019-11-16 MED ORDER — ATORVASTATIN CALCIUM 80 MG PO TABS: 80.0000 mg | ORAL_TABLET | Freq: Every day | ORAL | 1 refills | Status: DC

## 2019-11-16 MED ORDER — PANTOPRAZOLE SODIUM 40 MG PO TBEC: DELAYED_RELEASE_TABLET | ORAL | 0 refills | Status: DC

## 2019-11-16 MED ORDER — LOSARTAN POTASSIUM 50 MG PO TABS: 50.0000 mg | ORAL_TABLET | Freq: Every day | ORAL | 1 refills | Status: DC

## 2019-11-16 NOTE — Progress Notes (Signed)
Metrics: Intervention Frequency ACO  Documented Smoking Status Yearly  Screened one or more times in 24 months  Cessation Counseling or  Active cessation medication Past 24 months  Past 24 months   Guideline developer: UpToDate (See UpToDate for funding source) Date Released: 2014       Wellness Office Visit  Subjective:  Patient ID: Michael Cortez, male    DOB: 01/12/1956  Age: 64 y.o. MRN: [id number]  CC: This man comes in for follow-up of hypertension, coronary artery disease, erectile dysfunction. HPI  Overall, he is doing reasonably well and he had a checkup with his cardiologist in February and everything was stable.  He describes his erectile dysfunction has not improved significantly since starting on Cialis.  His testosterone levels were in a good range when I checked them last time.  He is compliant with testosterone therapy as before. Past Medical History:  Diagnosis Date  . Anxiety and depression   . Chest pain 2005   03/2012: neg nuclear stress.  2005- Cardiac cath:20-40% lesions in the LAD, circumflex and RCA with normal EF  . CVA (cerebral vascular accident) ([place])   . Degenerative joint disease   . Erectile dysfunction 05/11/2019  . Hyperlipidemia   . Tobacco abuse    40 pack year total consumption  . Vitamin D deficiency disease 05/11/2019      Family History  Problem Relation Age of Onset  . Coronary artery disease Father 4       Fatal myocardial infarction    Social History   Social History Narrative   Widower,remarried for last 4 months. Lives with wife..Retired.   Social History   Tobacco Use  . Smoking status: Former Smoker    Packs/day: 1.00    Years: 40.00    Pack years: 40.00  . Smokeless tobacco: Never Used  Substance Use Topics  . Alcohol use: No    Current Meds  Medication Sig  . amLODipine (NORVASC) 5 MG tablet Take 1 tablet (5 mg total) by mouth daily.  Marland Kitchen aspirin EC 81 MG EC tablet Take 1 tablet (81 mg total) by mouth daily.    Marland Kitchen atorvastatin (LIPITOR) 80 MG tablet Take 1 tablet (80 mg total) by mouth daily at 6 PM.  . Cholecalciferol (VITAMIN D) 125 MCG (5000 UT) CAPS Take 5,000 Units by mouth daily.  . clopidogrel (PLAVIX) 75 MG tablet Take 1 tablet (75 mg total) by mouth daily.  Marland Kitchen losartan (COZAAR) 50 MG tablet Take 1 tablet (50 mg total) by mouth daily.  . metoprolol succinate (TOPROL-XL) 50 MG 24 hr tablet Take 1 tablet (50 mg total) by mouth daily.  . nitroGLYCERIN (NITROSTAT) 0.4 MG SL tablet Place 1 tablet (0.4 mg total) under the tongue every 5 (five) minutes x 3 doses as needed for chest pain.  . pantoprazole (PROTONIX) 40 MG tablet TAKE 1 TABLET TWICE A DAY BY MOUTH  . tadalafil (CIALIS) 20 MG tablet Take 1/2 tablet every other day as needed for erectile dysfunction.  Marland Kitchen testosterone cypionate (DEPOTESTOSTERONE CYPIONATE) 200 MG/ML injection INJECT 0.5 MLS INTO THE MUSCLE TWICE A WEEK  . [DISCONTINUED] amLODipine (NORVASC) 5 MG tablet TAKE 1 TABLET DAILY  . [DISCONTINUED] atorvastatin (LIPITOR) 80 MG tablet Take 1 tablet (80 mg total) by mouth daily at 6 PM.  . [DISCONTINUED] clopidogrel (PLAVIX) 75 MG tablet Take 1 tablet (75 mg total) by mouth daily.  . [DISCONTINUED] losartan (COZAAR) 50 MG tablet Take 1 tablet (50 mg total) by mouth daily.  . [  DISCONTINUED] metoprolol succinate (TOPROL-XL) 50 MG 24 hr tablet Take 1 tablet (50 mg total) by mouth daily.  . [DISCONTINUED] pantoprazole (PROTONIX) 40 MG tablet TAKE 1 TABLET TWICE A DAY BY MOUTH      Objective:   Today's Vitals: BP (!) 144/88 (BP Location: Right Arm, Patient Position: Sitting, Cuff Size: Normal)   Pulse 78   Temp 98.4 F (36.9 C) (Temporal)   Resp 18   Ht 5\' 8"  (1.727 m)   Wt 204 lb (92.5 kg)   SpO2 96%   BMI 31.02 kg/m  Vitals with BMI 11/16/2019 09/16/2019 08/17/2019  Height 5\' 8"  5\' 8"  5\' 8"   Weight 204 lbs 201 lbs 196 lbs  BMI 31.03 30.57 29.81  Systolic 144 162 10/15/2019  Diastolic 88 90 84  Pulse 78 78 81     Physical  Exam   He looks systemically well.  He has gained about 3 pounds in weight.  Blood pressure actually has improved since the last visit.  He is alert and orientated without any focal neurological signs.    Assessment   1. Low testosterone   2. Essential hypertension   3. Mixed hyperlipidemia   4. Erectile dysfunction, unspecified erectile dysfunction type       Tests ordered Orders Placed This Encounter  Procedures  . COMPLETE METABOLIC PANEL WITH GFR     Plan: 1. Blood work is ordered above. 2. He will continue with same dose of testosterone therapy for his hypogonadism. 3. He will continue with antihypertensive therapy as before and he is achieving reasonably controlled blood pressure although it could be better. 4. He will continue with statin therapy for his hyperlipidemia and his lipid levels were in a good range last time. 5. I told him to increase his Cialis dose to 20 mg daily. 6. Further recommendations will depend on blood results and I will see him in about 3 months time for follow-up.   Meds ordered this encounter  Medications  . amLODipine (NORVASC) 5 MG tablet    Sig: Take 1 tablet (5 mg total) by mouth daily.    Dispense:  90 tablet    Refill:  0  . atorvastatin (LIPITOR) 80 MG tablet    Sig: Take 1 tablet (80 mg total) by mouth daily at 6 PM.    Dispense:  90 tablet    Refill:  1  . clopidogrel (PLAVIX) 75 MG tablet    Sig: Take 1 tablet (75 mg total) by mouth daily.    Dispense:  90 tablet    Refill:  1  . losartan (COZAAR) 50 MG tablet    Sig: Take 1 tablet (50 mg total) by mouth daily.    Dispense:  90 tablet    Refill:  1  . metoprolol succinate (TOPROL-XL) 50 MG 24 hr tablet    Sig: Take 1 tablet (50 mg total) by mouth daily.    Dispense:  90 tablet    Refill:  1  . pantoprazole (PROTONIX) 40 MG tablet    Sig: TAKE 1 TABLET TWICE A DAY BY MOUTH    Dispense:  180 tablet    Refill:  0    Talajah Slimp , MD

## 2019-11-17 LAB — COMPLETE METABOLIC PANEL WITH GFR
AG Ratio: 2.1 (calc) (ref 1.0–2.5)
ALT: 14 U/L (ref 9–46)
AST: 16 U/L (ref 10–35)
Albumin: 4.4 g/dL (ref 3.6–5.1)
Alkaline phosphatase (APISO): 71 U/L (ref 35–144)
BUN/Creatinine Ratio: 10 (calc) (ref 6–22)
BUN: 14 mg/dL (ref 7–25)
CO2: 27 mmol/L (ref 20–32)
Calcium: 9.2 mg/dL (ref 8.6–10.3)
Chloride: 105 mmol/L (ref 98–110)
Creat: 1.45 mg/dL — ABNORMAL HIGH (ref 0.70–1.25)
GFR, Est African American: 59 mL/min/{1.73_m2} — ABNORMAL LOW (ref >=60)
GFR, Est Non African American: 51 mL/min/{1.73_m2} — ABNORMAL LOW (ref >=60)
Globulin: 2.1 g/dL (calc) (ref 1.9–3.7)
Glucose, Bld: 99 mg/dL (ref 65–99)
Potassium: 4.6 mmol/L (ref 3.5–5.3)
Sodium: 139 mmol/L (ref 135–146)
Total Bilirubin: 0.7 mg/dL (ref 0.2–1.2)
Total Protein: 6.5 g/dL (ref 6.1–8.1)

## 2019-11-19 ENCOUNTER — Ambulatory Visit: Payer: MEDICAID | Attending: Internal Medicine

## 2019-11-19 DIAGNOSIS — Z23 Encounter for immunization: Secondary | ICD-10-CM

## 2019-11-19 NOTE — Progress Notes (Signed)
   Covid-19 Vaccination Clinic  Name:  Michael Cortez    MRN: [id number] DOB: 1955/09/27  11/19/2019  [name] was observed post Covid-19 immunization for 15 minutes without incident. He was provided with Vaccine Information Sheet and instruction to access the V-Safe system.   [name] was instructed to call 911 with any severe reactions post vaccine: Marland Kitchen Difficulty breathing  . Swelling of face and throat  . A fast heartbeat  . A bad rash all over body  . Dizziness and weakness   Immunizations Administered    Name Date Dose VIS Date Route   Moderna COVID-19 Vaccine 11/19/2019  8:44 AM 0.5 mL 07/14/2019 Intramuscular   Manufacturer: Moderna   Lot: [id number]   NDC: 22979-892-11

## 2019-12-03 ENCOUNTER — Other Ambulatory Visit (INDEPENDENT_AMBULATORY_CARE_PROVIDER_SITE_OTHER): Payer: Self-pay | Admitting: Internal Medicine

## 2019-12-03 ENCOUNTER — Telehealth (INDEPENDENT_AMBULATORY_CARE_PROVIDER_SITE_OTHER): Payer: Self-pay

## 2019-12-03 MED ORDER — TADALAFIL 20 MG PO TABS: 40.0000 mg | ORAL_TABLET | Freq: Every day | ORAL | 3 refills | Status: DC | PRN

## 2019-12-03 NOTE — Telephone Encounter (Signed)
Okay, I have sent the prescription now to this [facility]

## 2019-12-03 NOTE — Telephone Encounter (Signed)
The highest dose for this tablet is only 20 mg.  Would you like to take 2 of the 20 mg in that case?

## 2019-12-03 NOTE — Telephone Encounter (Signed)
Pt said yes 2 tablets daily will help. Can you writ Rx for this.

## 2019-12-08 NOTE — Telephone Encounter (Signed)
Refill done.  

## 2019-12-17 ENCOUNTER — Other Ambulatory Visit (INDEPENDENT_AMBULATORY_CARE_PROVIDER_SITE_OTHER): Payer: Self-pay | Admitting: Internal Medicine

## 2019-12-17 MED ORDER — TADALAFIL 20 MG PO TABS: 40.0000 mg | ORAL_TABLET | Freq: Every day | ORAL | 3 refills | Status: DC | PRN

## 2019-12-17 NOTE — Telephone Encounter (Signed)
Refill completed.

## 2019-12-22 ENCOUNTER — Ambulatory Visit: Payer: MEDICAID | Attending: Internal Medicine

## 2019-12-22 DIAGNOSIS — Z23 Encounter for immunization: Secondary | ICD-10-CM

## 2019-12-22 NOTE — Progress Notes (Signed)
   Covid-19 Vaccination Clinic  Name:  HANSON MEDEIROS    MRN: [id number] DOB: 02-Aug-1956  12/22/2019  Mr. Schiffman was observed post Covid-19 immunization for 15 minutes without incident. He was provided with Vaccine Information Sheet and instruction to access the V-Safe system.   Mr. Lofgren was instructed to call 911 with any severe reactions post vaccine: Marland Kitchen Difficulty breathing  . Swelling of face and throat  . A fast heartbeat  . A bad rash all over body  . Dizziness and weakness   Immunizations Administered    Name Date Dose VIS Date Route   Moderna COVID-19 Vaccine 12/22/2019  9:18 AM 0.5 mL 07/2019 Intramuscular   Manufacturer: Gala Murdoch   Lot: [id number]   NDC: 28413-244-01      Covid-19 Vaccination Clinic  Name:  JAYLEON MCFARLANE    MRN: [id number] DOB: 11-13-1955  12/22/2019  Mr. Tuberville was observed post Covid-19 immunization for 15 minutes without incident. He was provided with Vaccine Information Sheet and instruction to access the V-Safe system.   Mr. Petrow was instructed to call 911 with any severe reactions post vaccine: Marland Kitchen Difficulty breathing  . Swelling of face and throat  . A fast heartbeat  . A bad rash all over body  . Dizziness and weakness   Immunizations Administered    Name Date Dose VIS Date Route   Moderna COVID-19 Vaccine 12/22/2019  9:18 AM 0.5 mL 07/2019 Intramuscular   Manufacturer: Moderna   Lot: [id number]   NDC: 59563-875-64

## 2020-01-30 ENCOUNTER — Other Ambulatory Visit (INDEPENDENT_AMBULATORY_CARE_PROVIDER_SITE_OTHER): Payer: Self-pay | Admitting: Internal Medicine

## 2020-02-17 ENCOUNTER — Encounter (INDEPENDENT_AMBULATORY_CARE_PROVIDER_SITE_OTHER): Payer: Self-pay | Admitting: Internal Medicine

## 2020-02-17 ENCOUNTER — Other Ambulatory Visit: Payer: Self-pay

## 2020-02-17 ENCOUNTER — Ambulatory Visit (INDEPENDENT_AMBULATORY_CARE_PROVIDER_SITE_OTHER): Payer: 59 | Admitting: Internal Medicine

## 2020-02-17 VITALS — BP 138/90 | HR 89 | Temp 97.2°F | Resp 18 | Ht 66.0 in | Wt 205.8 lb

## 2020-02-17 DIAGNOSIS — R7989 Other specified abnormal findings of blood chemistry: Secondary | ICD-10-CM | POA: Diagnosis not present

## 2020-02-17 DIAGNOSIS — I10 Essential (primary) hypertension: Secondary | ICD-10-CM | POA: Diagnosis not present

## 2020-02-17 DIAGNOSIS — N529 Male erectile dysfunction, unspecified: Secondary | ICD-10-CM

## 2020-02-17 DIAGNOSIS — E782 Mixed hyperlipidemia: Secondary | ICD-10-CM | POA: Diagnosis not present

## 2020-02-17 MED ORDER — METOPROLOL SUCCINATE ER 50 MG PO TB24: 50.0000 mg | ORAL_TABLET | Freq: Every day | ORAL | 1 refills | Status: DC

## 2020-02-17 MED ORDER — ATORVASTATIN CALCIUM 80 MG PO TABS: 80.0000 mg | ORAL_TABLET | Freq: Every day | ORAL | 1 refills | Status: DC

## 2020-02-17 MED ORDER — AMLODIPINE BESYLATE 5 MG PO TABS: 5.0000 mg | ORAL_TABLET | Freq: Every day | ORAL | 1 refills | Status: DC

## 2020-02-17 MED ORDER — LOSARTAN POTASSIUM 50 MG PO TABS: 50.0000 mg | ORAL_TABLET | Freq: Every day | ORAL | 1 refills | Status: DC

## 2020-02-17 MED ORDER — CLOPIDOGREL BISULFATE 75 MG PO TABS: 75.0000 mg | ORAL_TABLET | Freq: Every day | ORAL | 1 refills | Status: DC

## 2020-02-17 NOTE — Progress Notes (Signed)
Metrics: Intervention Frequency ACO  Documented Smoking Status Yearly  Screened one or more times in 24 months  Cessation Counseling or  Active cessation medication Past 24 months  Past 24 months   Guideline developer: UpToDate (See UpToDate for funding source) Date Released: 2014       Wellness Office Visit  Subjective:  Patient ID: Michael [facility]ez, male    DOB: 02-Jan-1956  Age: 64 y.o. MRN: [id number]  CC: This man comes in for follow-up of hyperlipidemia, hypertension, cerebrovascular disease and erectile dysfunction. HPI  He has a history of low testosterone and is on testosterone therapy which is helped him with feeling better and erectile dysfunction but he still has problems with erectile dysfunction.  We increased the dose of Cialis on the last visit which is helped to some degree.  He wonders whether I can increase the dose of testosterone at this point.  His testosterone levels were in a reasonably good range last time they were checked in January of this year but he still does not feel as well as he should. He continues with amlodipine, losartan for hypertension. He continues with atorvastatin for hyperlipidemia.  He denies any chest pain, dyspnea, palpitations or limb weakness. Past Medical History:  Diagnosis Date  . Anxiety and depression   . Chest pain 2005   03/2012: neg nuclear stress.  2005- Cardiac cath:20-40% lesions in the LAD, circumflex and RCA with normal EF  . CVA (cerebral vascular accident) (HCC)   . Degenerative joint disease   . Erectile dysfunction 05/11/2019  . Hyperlipidemia   . Tobacco abuse    40 pack year total consumption  . Vitamin D deficiency disease 05/11/2019   Past Surgical History:  Procedure Laterality Date  . APPENDECTOMY    . CORONARY STENT INTERVENTION N/A 12/26/2018   Procedure: CORONARY STENT INTERVENTION;  Surgeon: Lennette Bihari, MD;  Location: [facility] INVASIVE CV LAB;  Service: Cardiovascular;  Laterality: N/A;  . LEFT HEART CATH AND  CORONARY ANGIOGRAPHY N/A 12/26/2018   Procedure: LEFT HEART CATH AND CORONARY ANGIOGRAPHY;  Surgeon: Lennette Bihari, MD;  Location: [facility] INVASIVE CV LAB;  Service: Cardiovascular;  Laterality: N/A;     Family History  Problem Relation Age of Onset  . Coronary artery disease Father 68       Fatal myocardial infarction    Social History   Social History Narrative   Widower,remarried for last 8 months. Lives with wife..Retired.   Social History   Tobacco Use  . Smoking status: Former Smoker    Packs/day: 1.00    Years: 40.00    Pack years: 40.00  . Smokeless tobacco: Never Used  Substance Use Topics  . Alcohol use: No    Current Meds  Medication Sig  . amLODipine (NORVASC) 5 MG tablet Take 1 tablet (5 mg total) by mouth daily.  Marland Kitchen atorvastatin (LIPITOR) 80 MG tablet Take 1 tablet (80 mg total) by mouth daily at 6 PM.  . CAREPOINT SAFETY1ST SYR/NEEDLE 23G X 1" 1 ML MISC USE AS DIRECTED.  Marland Kitchen Cholecalciferol (VITAMIN D) 125 MCG (5000 UT) CAPS Take 5,000 Units by mouth daily.  . clopidogrel (PLAVIX) 75 MG tablet Take 1 tablet (75 mg total) by mouth daily.  Marland Kitchen losartan (COZAAR) 50 MG tablet Take 1 tablet (50 mg total) by mouth daily.  . metoprolol succinate (TOPROL-XL) 50 MG 24 hr tablet Take 1 tablet (50 mg total) by mouth daily.  . nitroGLYCERIN (NITROSTAT) 0.4 MG SL tablet Place 1 tablet (0.4  mg total) under the tongue every 5 (five) minutes x 3 doses as needed for chest pain.  . pantoprazole (PROTONIX) 40 MG tablet TAKE 1 TABLET TWICE A DAY BY MOUTH (Patient taking differently: Take 40 mg by mouth daily. )  . tadalafil (CIALIS) 20 MG tablet Take 2 tablets (40 mg total) by mouth daily as needed for erectile dysfunction.  Marland Kitchen testosterone cypionate (DEPOTESTOSTERONE CYPIONATE) 200 MG/ML injection ADMINISTER 0.5 ML IN THE MUSCLE 2 TIMES A WEEK  . [DISCONTINUED] amLODipine (NORVASC) 5 MG tablet Take 1 tablet (5 mg total) by mouth daily.  . [DISCONTINUED] atorvastatin (LIPITOR) 80 MG tablet  Take 1 tablet (80 mg total) by mouth daily at 6 PM.  . [DISCONTINUED] clopidogrel (PLAVIX) 75 MG tablet Take 1 tablet (75 mg total) by mouth daily.  . [DISCONTINUED] losartan (COZAAR) 50 MG tablet Take 1 tablet (50 mg total) by mouth daily.  . [DISCONTINUED] metoprolol succinate (TOPROL-XL) 50 MG 24 hr tablet Take 1 tablet (50 mg total) by mouth daily.      Bio Identical Hormones  Testosterone therapy is being used off label for symptoms of testosterone deficiency and benefits that it produces based on several studies.  These benefits include decreasing body fat, increasing in lean muscle mass and increasing in bone density.  There is improvement of memory, cognition.  There is improvement in exercise tolerance and endurance.  Testosterone therapy has also been shown to be protective against coronary artery disease, cerebrovascular disease, diabetes, hypertension and degenerative joint disease. I have discussed with the patient the FDA warnings (blood clots, coronary artery disease, stroke, although this is not evidence-based medicine but is a black box warning) regarding testosterone therapy, benefits and side effects and modes of administration as well as monitoring blood levels and side effects  on a regular basis.  I have also discussed with this patient the possibility of testicular atrophy, infertility, erythrocytosis and the possibility of transference of testosterone to others if testosterone cream is used. The patient is agreeable that testosterone therapy should be an integral part of his/her wellness,quality of life and prevention of chronic disease.  No flowsheet data found.   Objective:   Today's Vitals: BP 138/90 (BP Location: Right Arm, Patient Position: Sitting, Cuff Size: Normal)   Pulse 89   Temp (!) 97.2 F (36.2 C) (Temporal)   Resp 18   Ht 5\' 6"  (1.676 m)   Wt 205 lb 12.8 oz (93.4 kg)   SpO2 98% Comment: wear amask.  BMI 33.22 kg/m  Vitals with BMI 02/17/2020 11/16/2019  09/16/2019  Height 5\' 6"  5\' 8"  5\' 8"   Weight 205 lbs 13 oz 204 lbs 201 lbs  BMI 33.23 31.03 30.57  Systolic 138 144 11/14/2019  Diastolic 90 88 90  Pulse 89 78 78     Physical Exam  He looks systemically well.  His weight is stable.  His blood pressure is slightly elevated today.  No other new focal neurological signs.     Assessment   1. Low testosterone   2. Essential hypertension   3. Mixed hyperlipidemia   4. Erectile dysfunction, unspecified erectile dysfunction type       Tests ordered No orders of the defined types were placed in this encounter.    Plan: 1. I have told him that he can increase the testosterone dose so he is taking 0.6 mL twice a week now.  I showed him where to give his injections because I think he was not giving it in the  right site on his legs.  He has some what appears to be neurological shooting pain from the knee down to his ankle on the right side.  I told him that if this continues despite injecting at the correct site based on landmarks and avoiding major nerves, then we may need to send him to a specialist. 2. He will continue with amlodipine and losartan for his hypertension which is keeping his blood pressure under reasonably good control. 3. He will continue with atorvastatin for his hyperlipidemia. 4. We discussed nutrition briefly also and I discussed a plant-based diet which I think will help his overall health and also will help with erectile dysfunction. 5. I will see him in about 3 months time for an annual physical exam when we will do all the blood work.   Meds ordered this encounter  Medications  . amLODipine (NORVASC) 5 MG tablet    Sig: Take 1 tablet (5 mg total) by mouth daily.    Dispense:  90 tablet    Refill:  1  . atorvastatin (LIPITOR) 80 MG tablet    Sig: Take 1 tablet (80 mg total) by mouth daily at 6 PM.    Dispense:  90 tablet    Refill:  1  . clopidogrel (PLAVIX) 75 MG tablet    Sig: Take 1 tablet (75 mg total) by  mouth daily.    Dispense:  90 tablet    Refill:  1  . losartan (COZAAR) 50 MG tablet    Sig: Take 1 tablet (50 mg total) by mouth daily.    Dispense:  90 tablet    Refill:  1  . metoprolol succinate (TOPROL-XL) 50 MG 24 hr tablet    Sig: Take 1 tablet (50 mg total) by mouth daily.    Dispense:  90 tablet    Refill:  1    Vernell Townley Normajean Glasgow, MD

## 2020-02-17 NOTE — Patient Instructions (Signed)
[facility] Dietary Recommendations for Weight Loss What to Avoid . Avoid added sugars o Often added sugar can be found in processed foods such as many condiments, dry cereals, cakes, cookies, chips, crisps, crackers, candies, sweetened drinks, etc.  o Read labels and AVOID/DECREASE use of foods with the following in their ingredient list: Sugar, fructose, high fructose corn syrup, sucrose, glucose, maltose, dextrose, molasses, cane sugar, brown sugar, any type of syrup, agave nectar, etc.   . Avoid snacking in between meals . Avoid foods made with flour o If you are going to eat food made with flour, choose those made with whole-grains; and, minimize your consumption as much as is tolerable . Avoid processed foods o These foods are generally stocked in the middle of the grocery store. Focus on shopping on the perimeter of the grocery.  . Avoid Meat  o We recommend following a plant-based diet at [facility]. Thus, we recommend avoiding meat as a general rule. Consider eating beans, legumes, eggs, and/or dairy products for regular protein sources o If you plan on eating meat limit to 4 ounces of meat at a time and choose lean options such as Fish, chicken, turkey. Avoid red meat intake such as pork and/or steak What to Include . Vegetables o GREEN LEAFY VEGETABLES: Kale, spinach, mustard greens, collard greens, cabbage, broccoli, etc. o OTHER: Asparagus, cauliflower, eggplant, carrots, peas, Brussel sprouts, tomatoes, bell peppers, zucchini, beets, cucumbers, etc. . Grains, seeds, and legumes o Beans: kidney beans, black eyed peas, garbanzo beans, black beans, pinto beans, etc. o Whole, unrefined grains: brown rice, barley, bulgur, oatmeal, etc. . Healthy fats  o Avoid highly processed fats such as vegetable oil o Examples of healthy fats: avocado, olives, virgin olive oil, dark chocolate (?72% Cocoa), nuts (peanuts, almonds, walnuts, cashews, pecans, etc.) . None to Low  Intake of Animal Sources of Protein o Meat sources: chicken, turkey, salmon, tuna. Limit to 4 ounces of meat at one time. o Consider limiting dairy sources, but when choosing dairy focus on: PLAIN Greek yogurt, cottage cheese, high-protein milk . Fruit o Choose berries  When to Eat . Intermittent Fasting: o Choosing not to eat for a specific time period, but DO FOCUS ON HYDRATION when fasting o Multiple Techniques: - Time Restricted Eating: eat 3 meals in a day, each meal lasting no more than 60 minutes, no snacks between meals - 16-18 hour fast: fast for 16 to 18 hours up to 7 days a week. Often suggested to start with 2-3 nonconsecutive days per week.  . Remember the time you sleep is counted as fasting.  . Examples of eating schedule: Fast from 7:00pm-11:00am. Eat between 11:00am-7:00pm.  - 24-hour fast: fast for 24 hours up to every other day. Often suggested to start with 1 day per week . Remember the time you sleep is counted as fasting . Examples of eating schedule:  o Eating day: eat 2-3 meals on your eating day. If doing 2 meals, each meal should last no more than 90 minutes. If doing 3 meals, each meal should last no more than 60 minutes. Finish last meal by 7:00pm. o Fasting day: Fast until 7:00pm.  o IF YOU FEEL UNWELL FOR ANY REASON/IN ANY WAY WHEN FASTING, STOP FASTING BY EATING A NUTRITIOUS SNACK OR LIGHT MEAL o ALWAYS FOCUS ON HYDRATION DURING FASTS - Acceptable Hydration sources: water, broths, tea/coffee (black tea/coffee is best but using a small amount of whole-fat dairy products in coffee/tea is acceptable).  -   Poor Hydration Sources: anything with sugar or artificial sweeteners added to it  These recommendations have been developed for patients that are actively receiving medical care from either Dr. Cornelis Kluver or Sarah Gray, DNP, NP-C at [facility]. These recommendations are developed for patients with specific medical conditions and are not meant to be  distributed or used by others that are not actively receiving care from either provider listed above at [facility]. It is not appropriate to participate in the above eating plans without proper medical supervision.   Reference: Fung, J. The obesity code. [place]: [place]; 2016.   

## 2020-03-14 IMAGING — US US CAROTID DUPLEX BILAT
1 series · 13 of 24 positions shown · non-contrast
Comparison: None.

CLINICAL DATA: Asymptomatic carotid bruit

EXAM:
BILATERAL CAROTID DUPLEX ULTRASOUND
TECHNIQUE: Gray scale imaging, color Doppler and duplex ultrasound were
performed of bilateral carotid and vertebral arteries in the neck.

[Series 1: us carotid duplex bilat · 0.06mm/px · 13 of 152 slices shown]
[im 1/152]
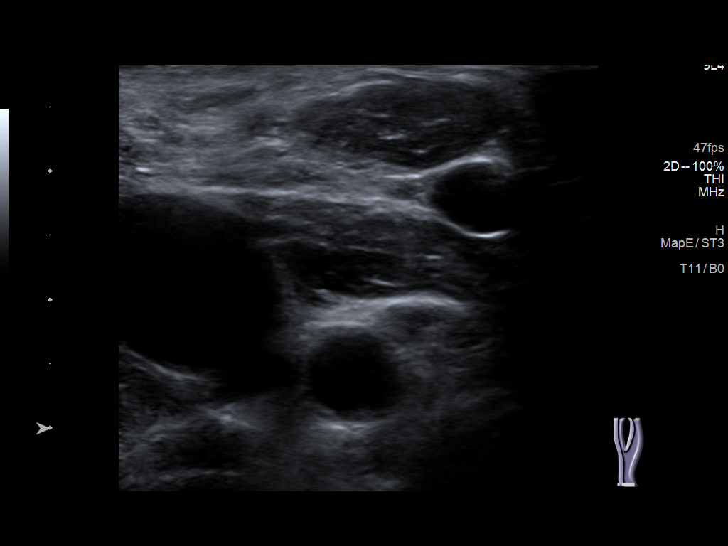
[im 14/152]
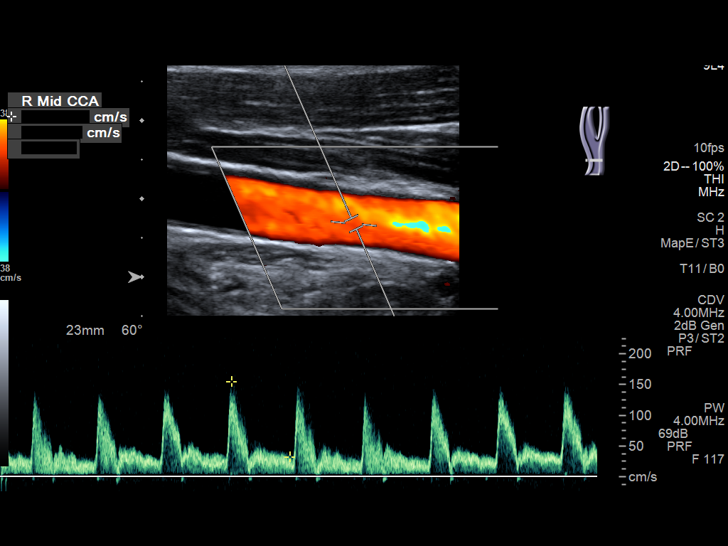
[im 27/152]
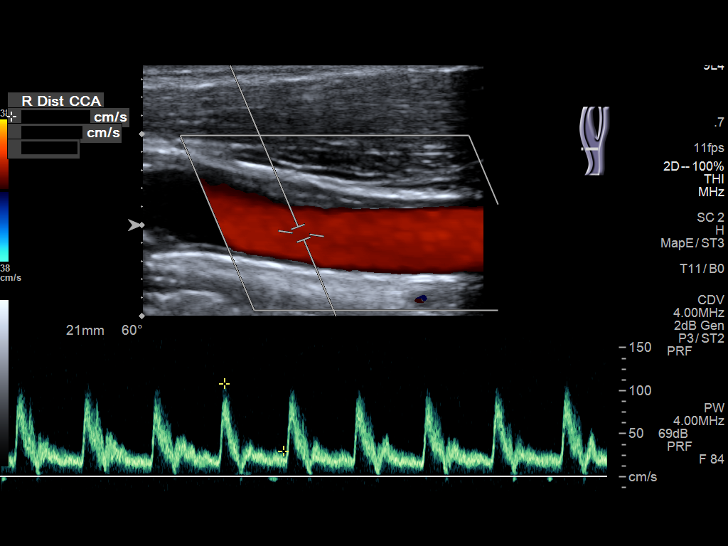
[im 40/152]
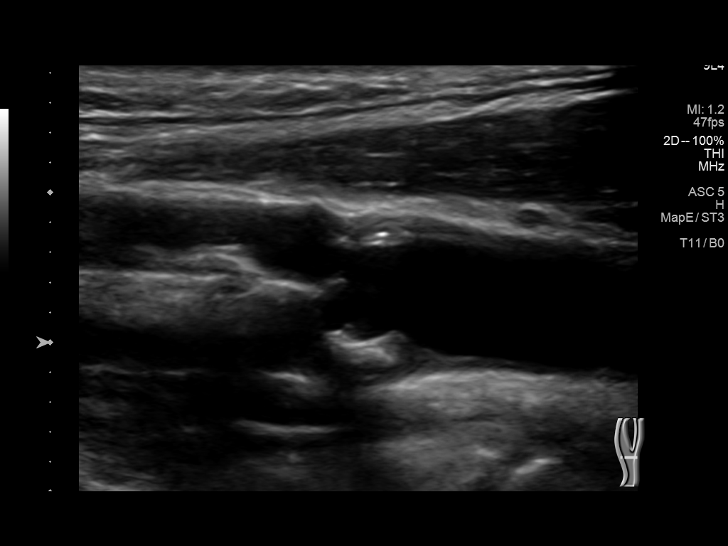
[im 53/152]
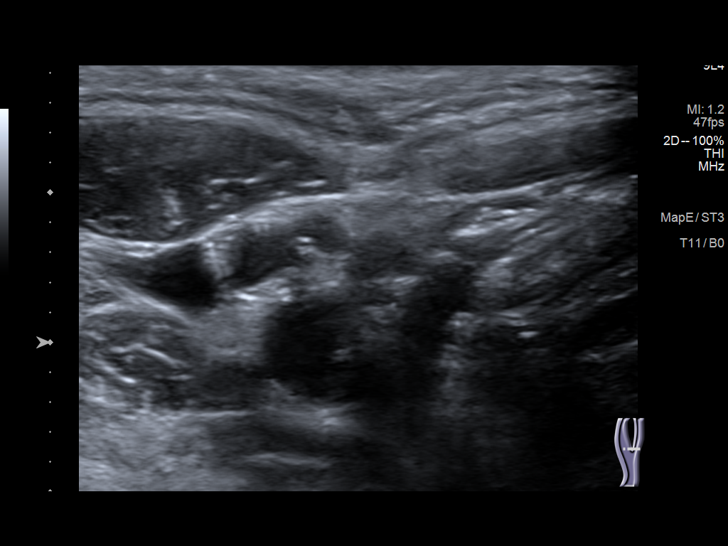
[im 66/152]
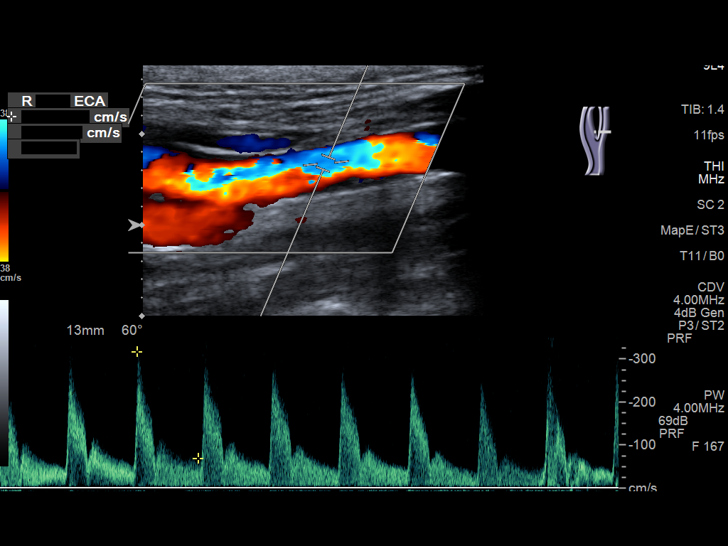
[im 79/152]
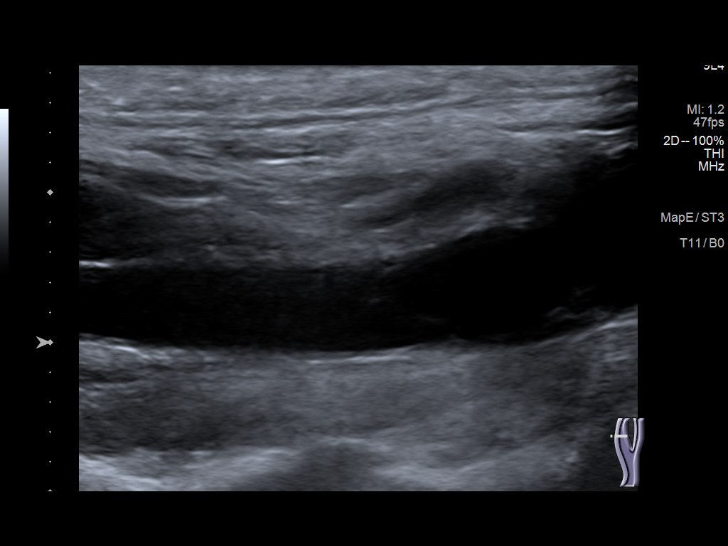
[im 86/152]
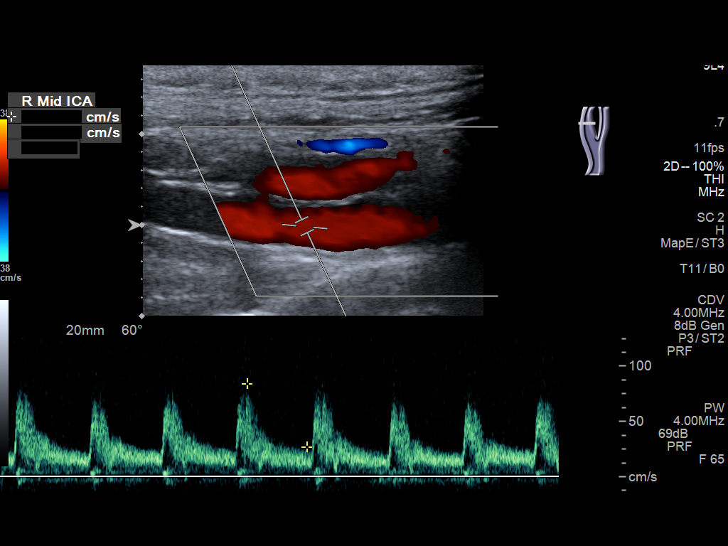
[im 99/152]
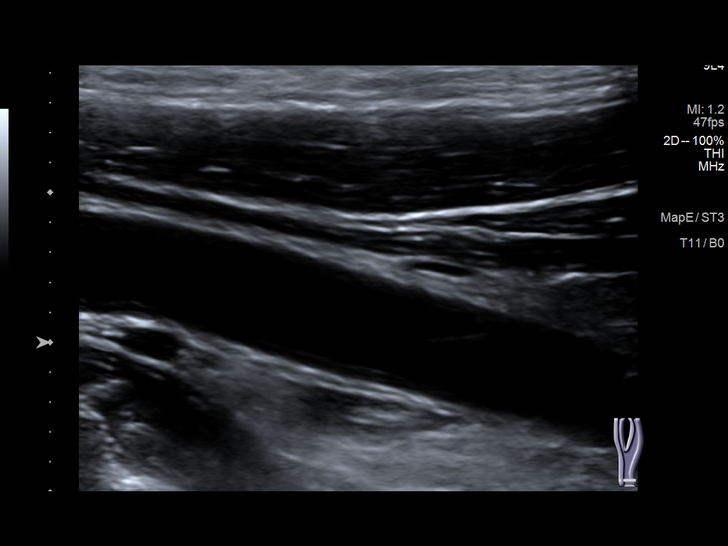
[im 112/152]
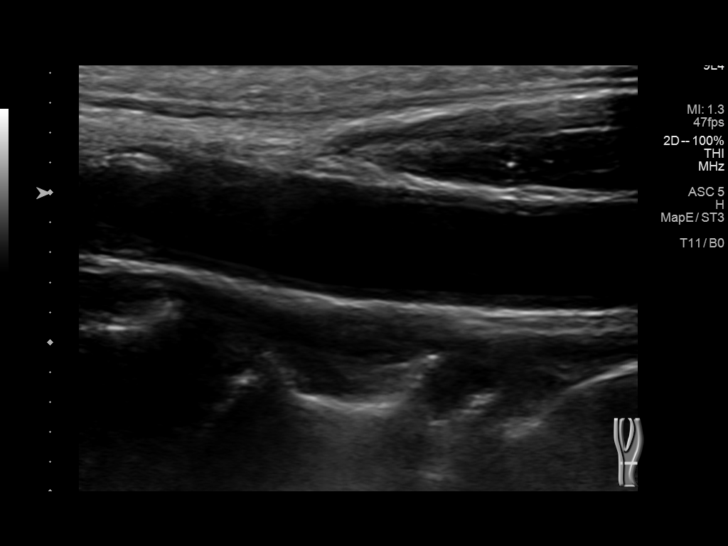
[im 125/152]
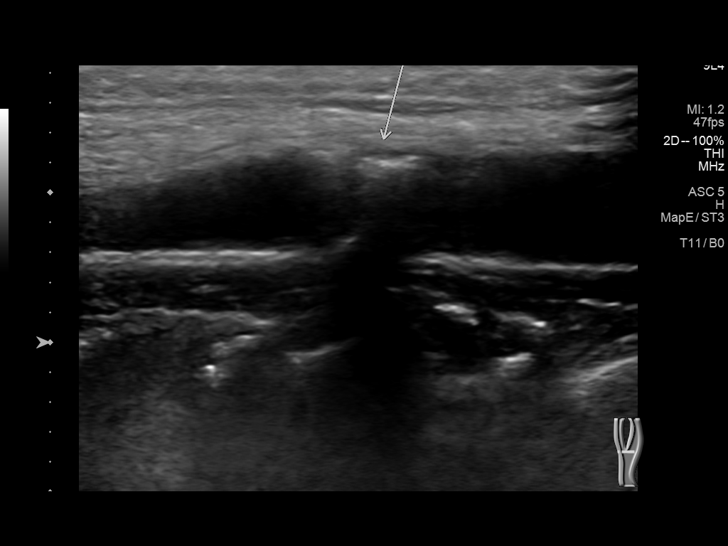
[im 138/152]
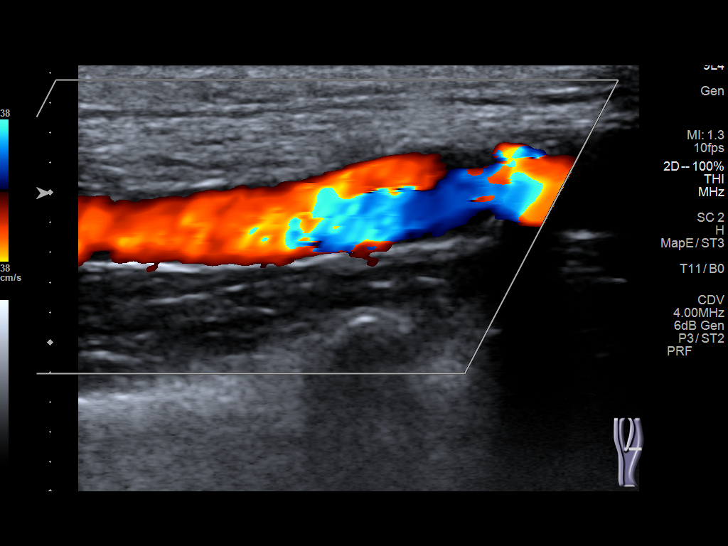
[im 152/152]
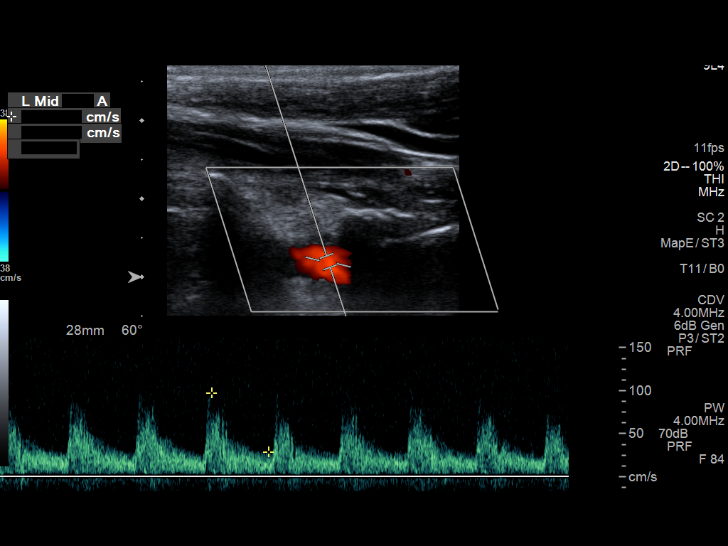

[13 of 24 positions shown; findings below may reference images not displayed]

FINDINGS: Criteria: Quantification of carotid stenosis is based on velocity
parameters that correlate the residual internal carotid diameter
with NASCET-based stenosis levels, using the diameter of the distal
internal carotid lumen as the denominator for stenosis measurement.

The following velocity measurements were obtained:

RIGHT

ICA: 117/20 cm/sec

CCA: 155/32 cm/sec

SYSTOLIC ICA/CCA RATIO:

ECA: 317 cm/sec

LEFT

ICA: 212/43 cm/sec

CCA: 141/31 cm/sec

SYSTOLIC ICA/CCA RATIO:

ECA: 268 cm/sec

RIGHT CAROTID ARTERY: Moderate echogenic shadowing plaque formation.
Despite this, no hemodynamically significant right ICA stenosis,
velocity elevation, or turbulent flow. Degree of narrowing less than
50%.

RIGHT VERTEBRAL ARTERY:  Antegrade

LEFT CAROTID ARTERY: Similar moderate calcific atherosclerosis.
Proximal ICA mild velocity elevation of 212/43 centimeters/second.
Mild turbulent flow. Left ICA stenosis estimated at 50-69% by
ultrasound criteria.

LEFT VERTEBRAL ARTERY:  Antegrade
IMPRESSION: Right ICA narrowing less than 50%.

Moderate left ICA stenosis estimated at 50-69%

Patent antegrade vertebral flow bilaterally

## 2020-03-21 ENCOUNTER — Other Ambulatory Visit (INDEPENDENT_AMBULATORY_CARE_PROVIDER_SITE_OTHER): Payer: Self-pay | Admitting: Internal Medicine

## 2020-04-06 ENCOUNTER — Telehealth (INDEPENDENT_AMBULATORY_CARE_PROVIDER_SITE_OTHER): Payer: Self-pay | Admitting: Internal Medicine

## 2020-04-06 ENCOUNTER — Other Ambulatory Visit (INDEPENDENT_AMBULATORY_CARE_PROVIDER_SITE_OTHER): Payer: Self-pay | Admitting: Internal Medicine

## 2020-04-06 MED ORDER — TESTOSTERONE CYPIONATE 200 MG/ML IM SOLN: INTRAMUSCULAR | 1 refills | Status: DC

## 2020-04-06 NOTE — Telephone Encounter (Signed)
Okay, I have sent the testosterone prescription now.

## 2020-05-09 ENCOUNTER — Other Ambulatory Visit (INDEPENDENT_AMBULATORY_CARE_PROVIDER_SITE_OTHER): Payer: Self-pay | Admitting: Internal Medicine

## 2020-05-09 ENCOUNTER — Other Ambulatory Visit (INDEPENDENT_AMBULATORY_CARE_PROVIDER_SITE_OTHER): Payer: Self-pay

## 2020-05-10 MED ORDER — TADALAFIL 20 MG PO TABS: 40.0000 mg | ORAL_TABLET | Freq: Every day | ORAL | 3 refills | Status: DC | PRN

## 2020-05-19 ENCOUNTER — Other Ambulatory Visit: Payer: Self-pay

## 2020-05-19 ENCOUNTER — Encounter (INDEPENDENT_AMBULATORY_CARE_PROVIDER_SITE_OTHER): Payer: Self-pay | Admitting: Internal Medicine

## 2020-05-19 ENCOUNTER — Ambulatory Visit (INDEPENDENT_AMBULATORY_CARE_PROVIDER_SITE_OTHER): Payer: 59 | Admitting: Internal Medicine

## 2020-05-19 VITALS — BP 140/80 | HR 65 | Ht 66.0 in | Wt 208.2 lb

## 2020-05-19 DIAGNOSIS — E782 Mixed hyperlipidemia: Secondary | ICD-10-CM

## 2020-05-19 DIAGNOSIS — I10 Essential (primary) hypertension: Secondary | ICD-10-CM

## 2020-05-19 DIAGNOSIS — E559 Vitamin D deficiency, unspecified: Secondary | ICD-10-CM | POA: Diagnosis not present

## 2020-05-19 DIAGNOSIS — Z131 Encounter for screening for diabetes mellitus: Secondary | ICD-10-CM

## 2020-05-19 DIAGNOSIS — R7989 Other specified abnormal findings of blood chemistry: Secondary | ICD-10-CM

## 2020-05-19 DIAGNOSIS — Z0001 Encounter for general adult medical examination with abnormal findings: Secondary | ICD-10-CM

## 2020-05-19 DIAGNOSIS — N529 Male erectile dysfunction, unspecified: Secondary | ICD-10-CM

## 2020-05-19 DIAGNOSIS — R0600 Dyspnea, unspecified: Secondary | ICD-10-CM

## 2020-05-19 DIAGNOSIS — Z125 Encounter for screening for malignant neoplasm of prostate: Secondary | ICD-10-CM

## 2020-05-19 NOTE — Progress Notes (Signed)
Chief Complaint: This pleasant 64 year old man comes in for an annual physical exam and to review his chronic medical conditions which are described below. HPI: He has a history of hypertension and coronary artery disease and he takes several antihypertensive medications for this. He has also had a stroke in the past.  Thankfully, no new symptoms. He does describe what I believe is PND for the last 1 month 3-4 episodes perhaps at the most.  He does not have dyspnea on any other situation such as exertion or orthopnea.  He has no peripheral leg swelling.  He has no chest pain.  He is due to see cardiology at the end of this month in about 3 weeks. Currently he also has postnasal drainage and a slight sore throat.  He is fully vaccinated against COVID-19 disease.  He has not been in contact with anyone knowingly who has COVID-19 disease.  He has no fevers or myalgias. He continues on testosterone therapy at a higher dose and he feels better to some degree on this.  He also uses Cialis for erectile dysfunction and he uses this almost every night. He continues on statin therapy in the face of coronary artery disease and cerebrovascular disease. He continues on vitamin D3 supplementation for vitamin D deficiency.  Past Medical History:  Diagnosis Date  . Anxiety and depression   . Chest pain 2005   03/2012: neg nuclear stress.  2005- Cardiac cath:20-40% lesions in the LAD, circumflex and RCA with normal EF  . CVA (cerebral vascular accident) (HCC)   . Degenerative joint disease   . Erectile dysfunction 05/11/2019  . Hyperlipidemia   . Hypertension   . Tobacco abuse    40 pack year total consumption  . Vitamin D deficiency disease 05/11/2019   Past Surgical History:  Procedure Laterality Date  . APPENDECTOMY    . CORONARY STENT INTERVENTION N/A 12/26/2018   Procedure: CORONARY STENT INTERVENTION;  Surgeon: Lennette Bihari, MD;  Location: [facility] INVASIVE CV LAB;  Service: Cardiovascular;  Laterality:  N/A;  . LEFT HEART CATH AND CORONARY ANGIOGRAPHY N/A 12/26/2018   Procedure: LEFT HEART CATH AND CORONARY ANGIOGRAPHY;  Surgeon: Lennette Bihari, MD;  Location: [facility] INVASIVE CV LAB;  Service: Cardiovascular;  Laterality: N/A;     Social History   Social History Narrative   Widower,remarried for last 11 months. Lives with wife..Retired.    Social History   Tobacco Use  . Smoking status: Former Smoker    Packs/day: 1.00    Years: 40.00    Pack years: 40.00  . Smokeless tobacco: Never Used  Substance Use Topics  . Alcohol use: No      Allergies: No Known Allergies   Current Meds  Medication Sig  . amLODipine (NORVASC) 5 MG tablet Take 1 tablet (5 mg total) by mouth daily.  Marland Kitchen atorvastatin (LIPITOR) 80 MG tablet Take 1 tablet (80 mg total) by mouth daily at 6 PM.  . CAREPOINT SAFETY1ST SYR/NEEDLE 23G X 1" 1 ML MISC USE AS DIRECTED.  Marland Kitchen Cholecalciferol (VITAMIN D) 125 MCG (5000 UT) CAPS Take 5,000 Units by mouth daily.  . clopidogrel (PLAVIX) 75 MG tablet Take 1 tablet (75 mg total) by mouth daily.  Marland Kitchen losartan (COZAAR) 50 MG tablet Take 1 tablet (50 mg total) by mouth daily.  . metoprolol succinate (TOPROL-XL) 50 MG 24 hr tablet Take 1 tablet (50 mg total) by mouth daily.  . nitroGLYCERIN (NITROSTAT) 0.4 MG SL tablet Place 1 tablet (0.4 mg total)  under the tongue every 5 (five) minutes x 3 doses as needed for chest pain.  . pantoprazole (PROTONIX) 40 MG tablet TAKE 1 TABLET TWICE A DAY BY MOUTH  . tadalafil (CIALIS) 20 MG tablet Take 2 tablets (40 mg total) by mouth daily as needed for erectile dysfunction.  Marland Kitchen testosterone cypionate (DEPOTESTOSTERONE CYPIONATE) 200 MG/ML injection ADMINISTER 0.6 ML IN THE MUSCLE 2 TIMES A WEEK       No flowsheet data found.   LKG:MWNUU from the symptoms mentioned above,there are no other symptoms referable to all systems reviewed.       Physical Exam: Blood pressure 140/80, pulse 65, height 5\' 6"  (1.676 m), weight 208 lb 3.2 oz (94.4  kg), SpO2 96 %. Vitals with BMI 05/19/2020 02/17/2020 11/16/2019  Height 5\' 6"  5\' 6"  5\' 8"   Weight 208 lbs 3 oz 205 lbs 13 oz 204 lbs  BMI 33.62 33.23 31.03  Systolic 140 138 01/16/2020  Diastolic 80 90 88  Pulse 65 89 78      He looks systemically well.  He is obese.  Blood pressure slightly elevated in the office today home readings have always been in a good range. General: Alert, cooperative, and appears to be the stated age.No pallor.  No jaundice.  No clubbing. Head: Normocephalic Eyes: Sclera white, pupils equal and reactive to light, red reflex x 2,  Ears: Normal bilaterally Oral cavity: Lips, mucosa, and tongue normal: Teeth and gums normal Neck: No adenopathy, supple, symmetrical, trachea midline, and thyroid does not appear enlarged Respiratory: Clear to auscultation bilaterally.No wheezing, crackles or bronchial breathing. Cardiovascular: Heart sounds are present and appear to be normal without murmurs or added sounds.  No carotid bruits.  Peripheral pulses are present and equal bilaterally.: Gastrointestinal:positive bowel sounds, no hepatosplenomegaly.  No masses felt.No tenderness. Skin: Clear, No rashes noted.No worrisome skin lesions seen. Neurological: Grossly intact without focal findings, cranial nerves II through XII intact, muscle strength equal bilaterally Musculoskeletal: No acute joint abnormalities noted.Full range of movement noted with joints. Psychiatric: Affect appropriate, non-anxious.    Assessment  1. Encounter for general adult medical examination with abnormal findings   2. Low testosterone   3. Essential hypertension   4. Mixed hyperlipidemia   5. Vitamin D deficiency disease   6. Erectile dysfunction, unspecified erectile dysfunction type   7. PND (paroxysmal nocturnal dyspnea)   8. Screening for diabetes mellitus   9. Special screening for malignant neoplasm of prostate     Tests Ordered:   Orders Placed This Encounter  Procedures  . CBC  .  COMPLETE METABOLIC PANEL WITH GFR  . Hemoglobin A1c  . Lipid panel  . PSA, Total with Reflex to PSA, Free  . T3, free  . T4  . TSH  . Testosterone Total,Free,Bio, Males  . VITAMIN D 25 Hydroxy (Vit-D Deficiency, Fractures)     Plan  1. Overall reasonably healthy man considering his chronic conditions. 2. He will continue with the higher dose of testosterone twice a week and I will check levels. 3. He will continue with antihypertensive medication listed above. 4. He will continue vitamin D3 supplementation we will check levels today. 5. As far as his possible PND is concerned, he is due to see cardiology in 3 weeks time and I have told him to make sure he mentions this to the cardiologist.  If he has more frequent episodes, he must call back or even go to the emergency room.  Clinically, this morning, he was not  in acute congestive heart failure. 6. Today, in addition to a preventative visit, I performed an office visit to address his chronic conditions and address his symptoms above.     No orders of the defined types were placed in this encounter.    Aala Ransom C Raheel Kunkle   05/19/2020, 8:46 AM

## 2020-05-25 ENCOUNTER — Other Ambulatory Visit: Payer: Self-pay

## 2020-05-25 ENCOUNTER — Other Ambulatory Visit (INDEPENDENT_AMBULATORY_CARE_PROVIDER_SITE_OTHER): Payer: 59

## 2020-05-26 LAB — HEMOGLOBIN A1C
Hgb A1c MFr Bld: 5.4 % of total Hgb (ref <5.7)
Mean Plasma Glucose: 108 (calc)
eAG (mmol/L): 6 (calc)

## 2020-05-26 LAB — COMPLETE METABOLIC PANEL WITH GFR
AG Ratio: 2.2 (calc) (ref 1.0–2.5)
ALT: 20 U/L (ref 9–46)
AST: 16 U/L (ref 10–35)
Albumin: 4.4 g/dL (ref 3.6–5.1)
Alkaline phosphatase (APISO): 82 U/L (ref 35–144)
BUN/Creatinine Ratio: 8 (calc) (ref 6–22)
BUN: 12 mg/dL (ref 7–25)
CO2: 30 mmol/L (ref 20–32)
Calcium: 9.4 mg/dL (ref 8.6–10.3)
Chloride: 101 mmol/L (ref 98–110)
Creat: 1.57 mg/dL — ABNORMAL HIGH (ref 0.70–1.25)
GFR, Est African American: 53 mL/min/{1.73_m2} — ABNORMAL LOW (ref >=60)
GFR, Est Non African American: 46 mL/min/{1.73_m2} — ABNORMAL LOW (ref >=60)
Globulin: 2 g/dL (calc) (ref 1.9–3.7)
Glucose, Bld: 80 mg/dL (ref 65–99)
Potassium: 4.9 mmol/L (ref 3.5–5.3)
Sodium: 138 mmol/L (ref 135–146)
Total Bilirubin: 0.7 mg/dL (ref 0.2–1.2)
Total Protein: 6.4 g/dL (ref 6.1–8.1)

## 2020-05-26 LAB — TESTOSTERONE TOTAL,FREE,BIO, MALES
Albumin: 4.4 g/dL (ref 3.6–5.1)
Sex Hormone Binding: 34 nmol/L (ref 22–77)
Testosterone, Bioavailable: 952.5 ng/dL — ABNORMAL HIGH (ref >=110.0)
Testosterone, Free: 473.2 pg/mL — ABNORMAL HIGH (ref 46.0–224.0)
Testosterone: 2171 ng/dL — ABNORMAL HIGH (ref 250–827)

## 2020-05-26 LAB — LIPID PANEL
Cholesterol: 123 mg/dL (ref <200)
HDL: 46 mg/dL (ref >=40)
LDL Cholesterol (Calc): 59 mg/dL (calc)
Non-HDL Cholesterol (Calc): 77 mg/dL (calc) (ref <130)
Total CHOL/HDL Ratio: 2.7 (calc) (ref <5.0)
Triglycerides: 101 mg/dL (ref <150)

## 2020-05-26 LAB — T3, FREE: T3, Free: 3.7 pg/mL (ref 2.3–4.2)

## 2020-05-26 LAB — VITAMIN D 25 HYDROXY (VIT D DEFICIENCY, FRACTURES): Vit D, 25-Hydroxy: 46 ng/mL (ref 30–100)

## 2020-05-26 LAB — CBC
HCT: 50.1 % — ABNORMAL HIGH (ref 38.5–50.0)
Hemoglobin: 17.7 g/dL — ABNORMAL HIGH (ref 13.2–17.1)
MCH: 33.3 pg — ABNORMAL HIGH (ref 27.0–33.0)
MCHC: 35.3 g/dL (ref 32.0–36.0)
MCV: 94.2 fL (ref 80.0–100.0)
MPV: 11.4 fL (ref 7.5–12.5)
Platelets: 200 10*3/uL (ref 140–400)
RBC: 5.32 10*6/uL (ref 4.20–5.80)
RDW: 12.3 % (ref 11.0–15.0)
WBC: 6.9 10*3/uL (ref 3.8–10.8)

## 2020-05-26 LAB — TSH: TSH: 1.81 mIU/L (ref 0.40–4.50)

## 2020-05-26 LAB — T4: T4, Total: 7.9 ug/dL (ref 4.9–10.5)

## 2020-05-26 LAB — PSA, TOTAL WITH REFLEX TO PSA, FREE: PSA, Total: 0.6 ng/mL (ref <=4.0)

## 2020-06-07 DIAGNOSIS — I6523 Occlusion and stenosis of bilateral carotid arteries: Secondary | ICD-10-CM | POA: Insufficient documentation

## 2020-06-07 DIAGNOSIS — I255 Ischemic cardiomyopathy: Secondary | ICD-10-CM | POA: Insufficient documentation

## 2020-06-07 NOTE — Progress Notes (Signed)
Cardiology Office Note   Date:  06/08/2020   ID:  Michael Cortez, DOB 07/22/1956, MRN [id number]  PCP:  Wilson Singer, MD  Cardiologist:   Rollene Rotunda, MD   Chief Complaint  Patient presents with  . Coronary Artery Disease      History of Present Illness:  Michael Cortez is a 64 y.o. male  who was admited in May of 2020 with a NSTEMI.  He had a cath with results listed below.  He had a mild/mod reduced EFof 45 - 50%.  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     He presents for follow up.  Since I last saw him he has had a couple of episodes of shortness of breath at night that he mentions casually.  He thinks he ate too much those days and he had to sit up to breathe.  He is not otherwise describing PND or orthopnea.  He is not particularly active but he does occasionally yard work.  He denies any of the discomfort that was his previous angina which was more of a heartburn.  He denies any shortness of breath other than mentioned and has no edema.  He has had slow gradual weight gain from eating too much.  He has had no palpitations, presyncope or syncope.  Past Medical History:  Diagnosis Date  . Anxiety and depression   . Chest pain 2005   03/2012: neg nuclear stress.  2005- Cardiac cath:20-40% lesions in the LAD, circumflex and RCA with normal EF  . CVA (cerebral vascular accident) (HCC)   . Degenerative joint disease   . Erectile dysfunction 05/11/2019  . Hyperlipidemia   . Hypertension   . Tobacco abuse    40 pack year total consumption  . Vitamin D deficiency disease 05/11/2019    Past Surgical History:  Procedure Laterality Date  . APPENDECTOMY    . CORONARY STENT INTERVENTION N/A 12/26/2018   Procedure: CORONARY STENT INTERVENTION;  Surgeon: Lennette Bihari, MD;  Location: [facility] INVASIVE CV LAB;  Service: Cardiovascular;  Laterality: N/A;  . LEFT HEART CATH AND CORONARY ANGIOGRAPHY N/A 12/26/2018   Procedure: LEFT HEART CATH AND CORONARY  ANGIOGRAPHY;  Surgeon: Lennette Bihari, MD;  Location: [facility] INVASIVE CV LAB;  Service: Cardiovascular;  Laterality: N/A;     Current Outpatient Medications  Medication Sig Dispense Refill  . amLODipine (NORVASC) 5 MG tablet Take 1 tablet (5 mg total) by mouth daily. 90 tablet 1  . atorvastatin (LIPITOR) 80 MG tablet Take 1 tablet (80 mg total) by mouth daily at 6 PM. 90 tablet 1  . Cholecalciferol (VITAMIN D) 125 MCG (5000 UT) CAPS Take 5,000 Units by mouth daily.    . clopidogrel (PLAVIX) 75 MG tablet Take 1 tablet (75 mg total) by mouth daily. 90 tablet 1  . losartan (COZAAR) 50 MG tablet Take 1 tablet (50 mg total) by mouth daily. 90 tablet 1  . metoprolol succinate (TOPROL-XL) 50 MG 24 hr tablet Take 1 tablet (50 mg total) by mouth daily. 90 tablet 1  . nitroGLYCERIN (NITROSTAT) 0.4 MG SL tablet Place 1 tablet (0.4 mg total) under the tongue every 5 (five) minutes x 3 doses as needed for chest pain. 30 tablet 5  . pantoprazole (PROTONIX) 40 MG tablet TAKE 1 TABLET TWICE A DAY BY MOUTH 180 tablet 0  . tadalafil (CIALIS) 20 MG tablet Take 2 tablets (40 mg total) by mouth daily as needed for erectile dysfunction.  30 tablet 3  . testosterone cypionate (DEPOTESTOSTERONE CYPIONATE) 200 MG/ML injection ADMINISTER 0.6 ML IN THE MUSCLE 2 TIMES A WEEK 10 mL 1  . CAREPOINT SAFETY1ST SYR/NEEDLE 23G X 1" 1 ML MISC USE AS DIRECTED.     No current facility-administered medications for this visit.    Allergies:   Patient has no known allergies.    ROS:  Please see the history of present illness.   Otherwise, review of systems are positive for none.   All other systems are reviewed and negative.    PHYSICAL EXAM: VS:  BP 128/80   Pulse 68   Ht 5\' 6"  (1.676 m)   Wt 207 lb (93.9 kg)   BMI 33.41 kg/m  , BMI Body mass index is 33.41 kg/m. GENERAL:  Well appearing NECK:  No jugular venous distention, waveform within normal limits, carotid upstroke brisk and symmetric, no bruits, no  thyromegaly LUNGS:  Clear to auscultation bilaterally CHEST:  Unremarkable HEART:  PMI not displaced or sustained,S1 and S2 within normal limits, no S3, no S4, no clicks, no rubs, no murmurs ABD:  Flat, positive bowel sounds normal in frequency in pitch, no bruits, no rebound, no guarding, no midline pulsatile mass, no hepatomegaly, no splenomegaly EXT:  2 plus pulses throughout, no edema, no cyanosis no clubbing   EKG:  EKG is not ordered today. The ekg ordered to 09/17/2019 demonstrates sinus rhythm, rate 78, axis within normal limits, intervals within normal limits, no acute ST-T wave changes.   Recent Labs: 05/25/2020: ALT 20; BUN 12; Creat 1.57; Hemoglobin 17.7; Platelets 200; Potassium 4.9; Sodium 138; TSH 1.81    Lipid Panel    Component Value Date/Time   CHOL 123 05/25/2020 1308   CHOL 207 (H) 03/12/2013 1615   TRIG 101 05/25/2020 1308   TRIG 87 03/12/2013 1615   HDL 46 05/25/2020 1308   HDL 47 03/12/2013 1615   CHOLHDL 2.7 05/25/2020 1308   VLDL 22 12/26/2018 0825   LDLCALC 59 05/25/2020 1308   LDLCALC 143 (H) 03/12/2013 1615      Wt Readings from Last 3 Encounters:  06/08/20 207 lb (93.9 kg)  05/19/20 208 lb 3.2 oz (94.4 kg)  02/17/20 205 lb 12.8 oz (93.4 kg)      Other studies Reviewed: Additional studies/ records that were reviewed today include: Labs. Review of the above records demonstrates:  Please see elsewhere in the note.     ASSESSMENT AND PLAN:   CAD The patient has no new sypmtoms.  No further cardiovascular testing is indicated.  We will continue with aggressive risk reduction and meds as listed.     I am using Plavix alone because of his previous stroke.  I did discuss with him increased activity and diet.  Hypertension The blood pressure is controlled.  No change in therapy.  Hyperlipidemia  LDL was 59 with an HDL of 46.   Ischemic cardiomyopathy He has a mildly reduced ejection fraction in the past.  I do not think he has any excess  volume.  He had some symptoms that were vague but I do not think represent necessarily PND or orthopnea but we talked about this and he will let me know if this happens in the future.   CKD II His creatinine is 1.57 which is up slightly.  This can be followed by 04/19/20, MD  CAROTID STENOSIS:  He had moderate bilateral 50 - 69% stenosis.  He will need repeat dopplers in February.  COVID EDUCATION:  He has been vaccinated and we talked about the booster.   Current medicines are reviewed at length with the patient today.  The patient does not have concerns regarding medicines.  The following changes have been made:  no change  Labs/ tests ordered today include: None No orders of the defined types were placed in this encounter.    Disposition:   FU with me one year.     Signed, Rollene Rotunda, MD  06/08/2020 3:59 PM    [facility]

## 2020-06-08 ENCOUNTER — Other Ambulatory Visit: Payer: Self-pay

## 2020-06-08 ENCOUNTER — Ambulatory Visit (INDEPENDENT_AMBULATORY_CARE_PROVIDER_SITE_OTHER): Payer: 59 | Admitting: Cardiology

## 2020-06-08 ENCOUNTER — Encounter: Payer: Self-pay | Admitting: Cardiology

## 2020-06-08 VITALS — BP 128/80 | HR 68 | Ht 66.0 in | Wt 207.0 lb

## 2020-06-08 DIAGNOSIS — I255 Ischemic cardiomyopathy: Secondary | ICD-10-CM | POA: Diagnosis not present

## 2020-06-08 DIAGNOSIS — I6523 Occlusion and stenosis of bilateral carotid arteries: Secondary | ICD-10-CM

## 2020-06-08 DIAGNOSIS — I10 Essential (primary) hypertension: Secondary | ICD-10-CM

## 2020-06-08 DIAGNOSIS — E785 Hyperlipidemia, unspecified: Secondary | ICD-10-CM | POA: Diagnosis not present

## 2020-06-08 DIAGNOSIS — I2510 Atherosclerotic heart disease of native coronary artery without angina pectoris: Secondary | ICD-10-CM

## 2020-06-08 NOTE — Patient Instructions (Signed)
Medication Instructions:  The current medical regimen is effective;  continue present plan and medications.  *If you need a refill on your cardiac medications before your next appointment, please call your pharmacy*  Follow-Up: At [facility], you and your health needs are our priority.  As part of our continuing mission to provide you with exceptional heart care, we have created designated Provider Care Teams.  These Care Teams include your primary Cardiologist (physician) and Advanced Practice Providers (APPs -  Physician Assistants and Nurse Practitioners) who all work together to provide you with the care you need, when you need it.  We recommend signing up for the patient portal called "MyChart".  Sign up information is provided on this After Visit Summary.  MyChart is used to connect with patients for Virtual Visits (Telemedicine).  Patients are able to view lab/test results, encounter notes, upcoming appointments, etc.  Non-urgent messages can be sent to your provider as well.   To learn more about what you can do with MyChart, go to [email or web address].    Your next appointment:   12 month(s)  The format for your next appointment:   In Person  Provider:   James Hochrein, MD   Thank you for choosing [facility]!!     

## 2020-06-20 ENCOUNTER — Other Ambulatory Visit (INDEPENDENT_AMBULATORY_CARE_PROVIDER_SITE_OTHER): Payer: Self-pay | Admitting: Internal Medicine

## 2020-08-11 ENCOUNTER — Other Ambulatory Visit (INDEPENDENT_AMBULATORY_CARE_PROVIDER_SITE_OTHER): Payer: Self-pay | Admitting: Internal Medicine

## 2020-08-17 ENCOUNTER — Other Ambulatory Visit (INDEPENDENT_AMBULATORY_CARE_PROVIDER_SITE_OTHER): Payer: Self-pay | Admitting: Internal Medicine

## 2020-08-18 ENCOUNTER — Ambulatory Visit (INDEPENDENT_AMBULATORY_CARE_PROVIDER_SITE_OTHER): Payer: 59 | Admitting: Internal Medicine

## 2020-08-18 ENCOUNTER — Other Ambulatory Visit: Payer: Self-pay

## 2020-08-18 ENCOUNTER — Encounter (INDEPENDENT_AMBULATORY_CARE_PROVIDER_SITE_OTHER): Payer: Self-pay | Admitting: Internal Medicine

## 2020-08-18 VITALS — BP 140/88 | HR 89 | Temp 97.9°F | Resp 18 | Ht 66.0 in | Wt 207.0 lb

## 2020-08-18 DIAGNOSIS — I10 Essential (primary) hypertension: Secondary | ICD-10-CM | POA: Diagnosis not present

## 2020-08-18 DIAGNOSIS — N529 Male erectile dysfunction, unspecified: Secondary | ICD-10-CM | POA: Diagnosis not present

## 2020-08-18 DIAGNOSIS — R7989 Other specified abnormal findings of blood chemistry: Secondary | ICD-10-CM

## 2020-08-18 MED ORDER — CLOPIDOGREL BISULFATE 75 MG PO TABS
75.0000 mg | ORAL_TABLET | Freq: Every day | ORAL | 1 refills | Status: DC
Start: 2020-08-18 — End: 2020-12-19

## 2020-08-18 MED ORDER — AMLODIPINE BESYLATE 5 MG PO TABS
5.0000 mg | ORAL_TABLET | Freq: Every day | ORAL | 1 refills | Status: DC
Start: 2020-08-18 — End: 2020-12-19

## 2020-08-18 MED ORDER — LOSARTAN POTASSIUM 50 MG PO TABS: 50.0000 mg | ORAL_TABLET | Freq: Every day | ORAL | 1 refills | Status: DC

## 2020-08-18 MED ORDER — METOPROLOL SUCCINATE ER 50 MG PO TB24: 50.0000 mg | ORAL_TABLET | Freq: Every day | ORAL | 1 refills | Status: DC

## 2020-08-18 MED ORDER — PANTOPRAZOLE SODIUM 40 MG PO TBEC: 40.0000 mg | DELAYED_RELEASE_TABLET | Freq: Every day | ORAL | 1 refills | Status: DC

## 2020-08-18 NOTE — Progress Notes (Signed)
Metrics: Intervention Frequency ACO  Documented Smoking Status Yearly  Screened one or more times in 24 months  Cessation Counseling or  Active cessation medication Past 24 months  Past 24 months   Guideline developer: UpToDate (See UpToDate for funding source) Date Released: 2014       Wellness Office Visit  Subjective:  Patient ID: Michael Cortez, male    DOB: 1956-05-24  Age: 65 y.o. MRN: [id number]  CC: This man comes in for follow-up of testosterone therapy, hypertension, dyslipidemia.  He also has vitamin D deficiency.  He has a history of cerebrovascular disease. HPI  Is doing reasonably well.  He describes an oily skin which is related to testosterone therapy.  His testosterone levels were excellent and optimal. He denies any chest pain, dyspnea, palpitations or limb weakness. He continues on statin therapy in the face of coronary artery disease and cerebrovascular disease. He continues with antihypertensive medications which are listed below. Past Medical History:  Diagnosis Date  . Anxiety and depression   . Chest pain 2005   03/2012: neg nuclear stress.  2005- Cardiac cath:20-40% lesions in the LAD, circumflex and RCA with normal EF  . CVA (cerebral vascular accident) (HCC)   . Degenerative joint disease   . Erectile dysfunction 05/11/2019  . Hyperlipidemia   . Hypertension   . Tobacco abuse    40 pack year total consumption  . Vitamin D deficiency disease 05/11/2019   Past Surgical History:  Procedure Laterality Date  . APPENDECTOMY    . CORONARY STENT INTERVENTION N/A 12/26/2018   Procedure: CORONARY STENT INTERVENTION;  Surgeon: Lennette Bihari, MD;  Location[facility] INVASIVE CV LAB;  Service: Cardiovascular;  Laterality: N/A;  . LEFT HEART CATH AND CORONARY ANGIOGRAPHY N/A 12/26/2018   Procedure: LEFT HEART CATH AND CORONARY ANGIOGRAPHY;  Surgeon: Lennette Bihari, MD;  Location: [facility] INVASIVE CV LAB;  Service: Cardiovascular;  Laterality: N/A;     Family History   Problem Relation Age of Onset  . Coronary artery disease Father 1       Fatal myocardial infarction  . Cancer Father   . Cancer Brother   . COPD Mother   . Cancer Mother     Social History   Social History Narrative   Widower,remarried for last 13 months. Lives with wife..Part time job.   Social History   Tobacco Use  . Smoking status: Former Smoker    Packs/day: 1.00    Years: 40.00    Pack years: 40.00  . Smokeless tobacco: Never Used  Substance Use Topics  . Alcohol use: No    Current Meds  Medication Sig  . atorvastatin (LIPITOR) 80 MG tablet Take 1 tablet (80 mg total) by mouth daily at 6 PM.  . CAREPOINT SAFETY1ST SYR/NEEDLE 23G X 1" 1 ML MISC USE AS DIRECTED.  Marland Kitchen Cholecalciferol (VITAMIN D) 125 MCG (5000 UT) CAPS Take 5,000 Units by mouth daily.  . nitroGLYCERIN (NITROSTAT) 0.4 MG SL tablet Place 1 tablet (0.4 mg total) under the tongue every 5 (five) minutes x 3 doses as needed for chest pain.  . tadalafil (CIALIS) 20 MG tablet TAKE UP TO 2 TABLETS ONCE DAILY AS DIRECTED FOR ERECTILE DYSFUNCTION  . testosterone cypionate (DEPOTESTOSTERONE CYPIONATE) 200 MG/ML injection ADMINISTER 0.6 ML IN THE MUSCLE 2 TIMES A WEEK  . [DISCONTINUED] amLODipine (NORVASC) 5 MG tablet Take 1 tablet (5 mg total) by mouth daily.  . [DISCONTINUED] clopidogrel (PLAVIX) 75 MG tablet Take 1 tablet (75 mg total) by  mouth daily.  . [DISCONTINUED] losartan (COZAAR) 50 MG tablet Take 1 tablet (50 mg total) by mouth daily.  . [DISCONTINUED] metoprolol succinate (TOPROL-XL) 50 MG 24 hr tablet Take 1 tablet (50 mg total) by mouth daily.  . [DISCONTINUED] pantoprazole (PROTONIX) 40 MG tablet TAKE 1 TABLET TWICE A DAY BY MOUTH      No flowsheet data found.   Objective:   Today's Vitals: BP 140/88 (BP Location: Left Arm, Patient Position: Sitting, Cuff Size: Normal)   Pulse 89   Temp 97.9 F (36.6 C) (Temporal)   Resp 18   Ht 5\' 6"  (1.676 m)   Wt 207 lb (93.9 kg)   SpO2 96%   BMI 33.41  kg/m  Vitals with BMI 08/18/2020 06/08/2020 05/19/2020  Height 5\' 6"  5\' 6"  5\' 6"   Weight 207 lbs 207 lbs 208 lbs 3 oz  BMI 33.43 33.43 33.62  Systolic 140 128 07/19/2020  Diastolic 88 80 80  Pulse 89 68 65     Physical Exam   He looks systemically well.  Blood pressure slightly elevated today.  Weight is very stable.  He remains obese.  Alert and orientated without any focal neurological signs.    Assessment   1. Essential hypertension   2. Low testosterone   3. Erectile dysfunction, unspecified erectile dysfunction type       Tests ordered Orders Placed This Encounter  Procedures  . COMPLETE METABOLIC PANEL WITH GFR     Plan: 1. He will continue with antihypertensive medications listed and I have refilled several of these today. 2. He will continue with testosterone therapy twice a week but I have told him that if he is concerned about his oily skin, he can reduce the dose to 0.5 mL twice a week and see how he feels on this.  He is afraid that if he reduces it too much, he may not feel as well as he is feeling now. 3. Blood work is ordered. 4. Follow-up in about 4 months.   Meds ordered this encounter  Medications  . amLODipine (NORVASC) 5 MG tablet    Sig: Take 1 tablet (5 mg total) by mouth daily.    Dispense:  90 tablet    Refill:  1  . clopidogrel (PLAVIX) 75 MG tablet    Sig: Take 1 tablet (75 mg total) by mouth daily.    Dispense:  90 tablet    Refill:  1  . losartan (COZAAR) 50 MG tablet    Sig: Take 1 tablet (50 mg total) by mouth daily.    Dispense:  90 tablet    Refill:  1  . metoprolol succinate (TOPROL-XL) 50 MG 24 hr tablet    Sig: Take 1 tablet (50 mg total) by mouth daily.    Dispense:  90 tablet    Refill:  1  . pantoprazole (PROTONIX) 40 MG tablet    Sig: Take 1 tablet (40 mg total) by mouth daily.    Dispense:  90 tablet    Refill:  1    Jadis Pitter , MD

## 2020-08-19 LAB — COMPLETE METABOLIC PANEL WITH GFR
AG Ratio: 1.9 (calc) (ref 1.0–2.5)
ALT: 18 U/L (ref 9–46)
AST: 17 U/L (ref 10–35)
Albumin: 4.2 g/dL (ref 3.6–5.1)
Alkaline phosphatase (APISO): 62 U/L (ref 35–144)
BUN/Creatinine Ratio: 12 (calc) (ref 6–22)
BUN: 17 mg/dL (ref 7–25)
CO2: 29 mmol/L (ref 20–32)
Calcium: 9.2 mg/dL (ref 8.6–10.3)
Chloride: 101 mmol/L (ref 98–110)
Creat: 1.43 mg/dL — ABNORMAL HIGH (ref 0.70–1.25)
GFR, Est African American: 60 mL/min/{1.73_m2} (ref >=60)
GFR, Est Non African American: 51 mL/min/{1.73_m2} — ABNORMAL LOW (ref >=60)
Globulin: 2.2 g/dL (calc) (ref 1.9–3.7)
Glucose, Bld: 80 mg/dL (ref 65–139)
Potassium: 4.6 mmol/L (ref 3.5–5.3)
Sodium: 137 mmol/L (ref 135–146)
Total Bilirubin: 0.5 mg/dL (ref 0.2–1.2)
Total Protein: 6.4 g/dL (ref 6.1–8.1)

## 2020-08-24 ENCOUNTER — Ambulatory Visit (INDEPENDENT_AMBULATORY_CARE_PROVIDER_SITE_OTHER): Payer: 59 | Admitting: Internal Medicine

## 2020-09-22 ENCOUNTER — Ambulatory Visit (HOSPITAL_COMMUNITY)
Admission: RE | Admit: 2020-09-22 | Discharge: 2020-09-22 | Disposition: A | Discharge status: Home / Self Care | Payer: 59 | Source: Ambulatory Visit | Attending: Cardiovascular Disease | Admitting: Cardiovascular Disease

## 2020-09-22 ENCOUNTER — Other Ambulatory Visit (HOSPITAL_COMMUNITY): Payer: Self-pay | Admitting: Cardiology

## 2020-09-22 ENCOUNTER — Other Ambulatory Visit: Payer: Self-pay

## 2020-09-22 DIAGNOSIS — I6523 Occlusion and stenosis of bilateral carotid arteries: Secondary | ICD-10-CM

## 2020-09-26 ENCOUNTER — Telehealth: Payer: Self-pay

## 2020-09-26 NOTE — Telephone Encounter (Signed)
Message left for patient to call back.  Carotid ultrasound results: Right Carotid: Velocities in the right ICA are consistent with a 1-39% stenosis. Left Carotid: Velocities in the left ICA are consistent with a 40-59% stenosis. No change. Follow up Doppler in one year.

## 2020-09-26 NOTE — Telephone Encounter (Signed)
Spoke with patient, results reviewed.

## 2020-09-26 NOTE — Telephone Encounter (Signed)
Patient returning call.

## 2020-09-28 ENCOUNTER — Telehealth (INDEPENDENT_AMBULATORY_CARE_PROVIDER_SITE_OTHER): Payer: Self-pay | Admitting: Internal Medicine

## 2020-09-28 NOTE — Telephone Encounter (Signed)
Done

## 2020-11-03 ENCOUNTER — Other Ambulatory Visit: Payer: Self-pay

## 2020-11-03 ENCOUNTER — Encounter (INDEPENDENT_AMBULATORY_CARE_PROVIDER_SITE_OTHER): Payer: Self-pay | Admitting: Internal Medicine

## 2020-11-03 ENCOUNTER — Ambulatory Visit (INDEPENDENT_AMBULATORY_CARE_PROVIDER_SITE_OTHER): Payer: 59 | Admitting: Internal Medicine

## 2020-11-03 VITALS — BP 128/70 | HR 77 | Temp 97.8°F | Resp 18 | Ht 66.0 in | Wt 201.4 lb

## 2020-11-03 DIAGNOSIS — M545 Low back pain, unspecified: Secondary | ICD-10-CM | POA: Diagnosis not present

## 2020-11-03 DIAGNOSIS — N2889 Other specified disorders of kidney and ureter: Secondary | ICD-10-CM | POA: Diagnosis not present

## 2020-11-03 MED ORDER — TRAMADOL HCL 50 MG PO TABS: 50.0000 mg | ORAL_TABLET | Freq: Three times a day (TID) | ORAL | 0 refills | Status: AC | PRN

## 2020-11-03 NOTE — Progress Notes (Signed)
Metrics: Intervention Frequency ACO  Documented Smoking Status Yearly  Screened one or more times in 24 months  Cessation Counseling or  Active cessation medication Past 24 months  Past 24 months   Guideline developer: UpToDate (See UpToDate for funding source) Date Released: 2014       Wellness Office Visit  Subjective:  Patient ID: Michael Cortez, male    DOB: 1955-12-16  Age: 64 y.o. MRN: [id number]  CC: Right loin pain. HPI  This man comes in for an acute visit with the above symptoms which he has had ongoing for the last 3 months or so.  In 2018, a CT scan of the abdomen/pelvis showed 2 masses in the right kidney measuring 9 mm and 12 mm.  I had sent him to urology and urology had recommended further investigations but at the time the patient did not have any insurance and could not afford these investigations.  There was no further follow-up after this.  He denies any hematuria now which he had before.  The pain is in the right loin and radiates around his back to the left side.  The pain tends to wake him up at 1:00 in the morning.  Over-the-counter pain medications with Tylenol do not seem to help. Interestingly, he also describes urinary incontinence at night.  This is a new symptom. Past Medical History:  Diagnosis Date  . Anxiety and depression   . Chest pain 2005   03/2012: neg nuclear stress.  2005- Cardiac cath:20-40% lesions in the LAD, circumflex and RCA with normal EF  . CVA (cerebral vascular accident) (HCC)   . Degenerative joint disease   . Erectile dysfunction 05/11/2019  . Hyperlipidemia   . Hypertension   . Tobacco abuse    40 pack year total consumption  . Vitamin D deficiency disease 05/11/2019   Past Surgical History:  Procedure Laterality Date  . APPENDECTOMY    . CORONARY STENT INTERVENTION N/A 12/26/2018   Procedure: CORONARY STENT INTERVENTION;  Surgeon: Lennette Bihari, MD;  Location: [facility] INVASIVE CV LAB;  Service: Cardiovascular;  Laterality: N/A;   . LEFT HEART CATH AND CORONARY ANGIOGRAPHY N/A 12/26/2018   Procedure: LEFT HEART CATH AND CORONARY ANGIOGRAPHY;  Surgeon: Lennette Bihari, MD;  Location: [facility] INVASIVE CV LAB;  Service: Cardiovascular;  Laterality: N/A;     Family History  Problem Relation Age of Onset  . Coronary artery disease Father 8       Fatal myocardial infarction  . Cancer Father   . Cancer Brother   . COPD Mother   . Cancer Mother     Social History   Social History Narrative   Widower,remarried for last 13 months. Lives with wife..Part time job.   Social History   Tobacco Use  . Smoking status: Former Smoker    Packs/day: 1.00    Years: 40.00    Pack years: 40.00  . Smokeless tobacco: Never Used  Substance Use Topics  . Alcohol use: No    Current Meds  Medication Sig  . amLODipine (NORVASC) 5 MG tablet Take 1 tablet (5 mg total) by mouth daily.  Marland Kitchen atorvastatin (LIPITOR) 80 MG tablet Take 1 tablet (80 mg total) by mouth daily at 6 PM.  . CAREPOINT SAFETY1ST SYR/NEEDLE 23G X 1" 1 ML MISC USE AS DIRECTED.  Marland Kitchen CAREPOINT SAFETY1ST SYR/NEEDLE 23G X 1" 1 ML MISC USE AS DIRECTED.ED  . Cholecalciferol (VITAMIN D) 125 MCG (5000 UT) CAPS Take 5,000 Units by mouth daily.  Marland Kitchen  clopidogrel (PLAVIX) 75 MG tablet Take 1 tablet (75 mg total) by mouth daily.  Marland Kitchen losartan (COZAAR) 50 MG tablet Take 1 tablet (50 mg total) by mouth daily.  . metoprolol succinate (TOPROL-XL) 50 MG 24 hr tablet Take 1 tablet (50 mg total) by mouth daily.  . nitroGLYCERIN (NITROSTAT) 0.4 MG SL tablet Place 1 tablet (0.4 mg total) under the tongue every 5 (five) minutes x 3 doses as needed for chest pain.  . pantoprazole (PROTONIX) 40 MG tablet Take 1 tablet (40 mg total) by mouth daily.  . tadalafil (CIALIS) 20 MG tablet TAKE UP TO 2 TABLETS ONCE DAILY AS DIRECTED FOR ERECTILE DYSFUNCTION  . testosterone cypionate (DEPOTESTOSTERONE CYPIONATE) 200 MG/ML injection ADMINISTER 0.6 ML IN THE MUSCLE 2 TIMES A WEEK  . traMADol (ULTRAM) 50 MG  tablet Take 1 tablet (50 mg total) by mouth every 8 (eight) hours as needed for up to 5 days.  . TUBERCULIN SYR 1CC/25GX5/8" 25G X 5/8" 1 ML MISC        Objective:   Today's Vitals: BP 128/70 (BP Location: Right Arm, Patient Position: Sitting, Cuff Size: Normal)   Pulse 77   Temp 97.8 F (36.6 C) (Temporal)   Resp 18   Ht 5\' 6"  (1.676 m)   Wt 201 lb 6.4 oz (91.4 kg)   BMI 32.51 kg/m  Vitals with BMI 11/03/2020 08/18/2020 06/08/2020  Height 5\' 6"  5\' 6"  5\' 6"   Weight 201 lbs 6 oz 207 lbs 207 lbs  BMI 32.52 33.43 33.43  Systolic 128 140 06/10/2020  Diastolic 70 88 80  Pulse 77 89 68     Physical Exam    On examination, he does not have any bony spinal tenderness.  The area of the pain is in the right loin area and he is not specifically tender there.   Assessment   1. Right loin pain   2. Other specified disorders of kidney and ureter   3. Renal mass, right       Tests ordered Orders Placed This Encounter  Procedures  . MR Abdomen W Wo Contrast     Plan: 1. I am concerned that he may have renal cell cancer based on previous CT scan and no further follow-up.  I will order MRI of the abdomen for further evaluation.  In the meantime, I will give him tramadol temporarily to help him with pain and help him sleep.  Further recommendations will depend on this result.   Meds ordered this encounter  Medications  . traMADol (ULTRAM) 50 MG tablet    Sig: Take 1 tablet (50 mg total) by mouth every 8 (eight) hours as needed for up to 5 days.    Dispense:  30 tablet    Refill:  0    Nimish , MD

## 2020-11-05 ENCOUNTER — Other Ambulatory Visit (INDEPENDENT_AMBULATORY_CARE_PROVIDER_SITE_OTHER): Payer: Self-pay | Admitting: Internal Medicine

## 2020-11-22 ENCOUNTER — Ambulatory Visit (HOSPITAL_COMMUNITY)
Admission: RE | Admit: 2020-11-22 | Discharge: 2020-11-22 | Disposition: A | Discharge status: Home / Self Care | Payer: Medicare Other | Source: Ambulatory Visit | Attending: Internal Medicine | Admitting: Internal Medicine

## 2020-11-22 DIAGNOSIS — M545 Low back pain, unspecified: Secondary | ICD-10-CM | POA: Insufficient documentation

## 2020-11-22 DIAGNOSIS — N281 Cyst of kidney, acquired: Secondary | ICD-10-CM | POA: Diagnosis not present

## 2020-11-22 DIAGNOSIS — N2889 Other specified disorders of kidney and ureter: Secondary | ICD-10-CM | POA: Diagnosis not present

## 2020-11-22 MED ORDER — GADOBUTROL 1 MMOL/ML IV SOLN
7.0000 mL | Freq: Once | INTRAVENOUS | Status: AC | PRN
  Administered 2020-11-22: 7 mL via INTRAVENOUS

## 2020-11-24 ENCOUNTER — Telehealth (INDEPENDENT_AMBULATORY_CARE_PROVIDER_SITE_OTHER): Payer: Self-pay

## 2020-11-24 NOTE — Telephone Encounter (Signed)
Pt called to get results of MRI. PT cannot get into his mychart. Pt is happy to know it was no findings & will see him in May on next appt.

## 2020-12-19 ENCOUNTER — Encounter (INDEPENDENT_AMBULATORY_CARE_PROVIDER_SITE_OTHER): Payer: Self-pay | Admitting: Internal Medicine

## 2020-12-19 ENCOUNTER — Other Ambulatory Visit: Payer: Self-pay

## 2020-12-19 ENCOUNTER — Ambulatory Visit (INDEPENDENT_AMBULATORY_CARE_PROVIDER_SITE_OTHER): Payer: Medicare Other | Admitting: Internal Medicine

## 2020-12-19 VITALS — BP 140/80 | HR 88 | Temp 97.1°F | Resp 18 | Ht 66.0 in | Wt 204.0 lb

## 2020-12-19 DIAGNOSIS — E782 Mixed hyperlipidemia: Secondary | ICD-10-CM

## 2020-12-19 DIAGNOSIS — G8929 Other chronic pain: Secondary | ICD-10-CM

## 2020-12-19 DIAGNOSIS — I10 Essential (primary) hypertension: Secondary | ICD-10-CM | POA: Diagnosis not present

## 2020-12-19 DIAGNOSIS — M545 Low back pain, unspecified: Secondary | ICD-10-CM | POA: Diagnosis not present

## 2020-12-19 DIAGNOSIS — E559 Vitamin D deficiency, unspecified: Secondary | ICD-10-CM

## 2020-12-19 DIAGNOSIS — R7989 Other specified abnormal findings of blood chemistry: Secondary | ICD-10-CM | POA: Diagnosis not present

## 2020-12-19 MED ORDER — LOSARTAN POTASSIUM 50 MG PO TABS: 50.0000 mg | ORAL_TABLET | Freq: Every day | ORAL | 1 refills | Status: DC

## 2020-12-19 MED ORDER — PANTOPRAZOLE SODIUM 40 MG PO TBEC: 40.0000 mg | DELAYED_RELEASE_TABLET | Freq: Every day | ORAL | 0 refills | Status: DC

## 2020-12-19 MED ORDER — CLOPIDOGREL BISULFATE 75 MG PO TABS: 75.0000 mg | ORAL_TABLET | Freq: Every day | ORAL | 1 refills | Status: DC

## 2020-12-19 MED ORDER — AMLODIPINE BESYLATE 5 MG PO TABS
5.0000 mg | ORAL_TABLET | Freq: Every day | ORAL | 0 refills | Status: DC
Start: 2020-12-19 — End: 2021-02-06

## 2020-12-19 MED ORDER — ATORVASTATIN CALCIUM 80 MG PO TABS: 80.0000 mg | ORAL_TABLET | Freq: Every day | ORAL | 1 refills | Status: DC

## 2020-12-19 MED ORDER — ATORVASTATIN CALCIUM 80 MG PO TABS: 80.0000 mg | ORAL_TABLET | Freq: Every day | ORAL | 0 refills | Status: DC

## 2020-12-19 MED ORDER — METOPROLOL SUCCINATE ER 50 MG PO TB24: 50.0000 mg | ORAL_TABLET | Freq: Every day | ORAL | 0 refills | Status: DC

## 2020-12-19 MED ORDER — CLOPIDOGREL BISULFATE 75 MG PO TABS: 75.0000 mg | ORAL_TABLET | Freq: Every day | ORAL | 0 refills | Status: DC

## 2020-12-19 MED ORDER — PANTOPRAZOLE SODIUM 40 MG PO TBEC: 40.0000 mg | DELAYED_RELEASE_TABLET | Freq: Every day | ORAL | 1 refills | Status: DC

## 2020-12-19 MED ORDER — METOPROLOL SUCCINATE ER 50 MG PO TB24: 50.0000 mg | ORAL_TABLET | Freq: Every day | ORAL | 1 refills | Status: DC

## 2020-12-19 MED ORDER — LOSARTAN POTASSIUM 50 MG PO TABS: 50.0000 mg | ORAL_TABLET | Freq: Every day | ORAL | 0 refills | Status: DC

## 2020-12-19 MED ORDER — AMLODIPINE BESYLATE 5 MG PO TABS: 5.0000 mg | ORAL_TABLET | Freq: Every day | ORAL | 1 refills | Status: DC

## 2020-12-19 NOTE — Progress Notes (Signed)
Metrics: Intervention Frequency ACO  Documented Smoking Status Yearly  Screened one or more times in 24 months  Cessation Counseling or  Active cessation medication Past 24 months  Past 24 months   Guideline developer: UpToDate (See UpToDate for funding source) Date Released: 2014       Wellness Office Visit  Subjective:  Patient ID: Michael Cortez, male    DOB: 16-Jun-1956  Age: 65 y.o. MRN: [id number]  CC: This man comes in for follow-up of hypertension, testosterone therapy, dyslipidemia, vitamin D deficiency. HPI  When I saw him the last time, he was complaining of right loin pain and I was concerned about the possibility of renal cell cancer.  Thankfully, the MRI did not confirm this diagnosis and he has a 9 mm cyst in the right kidney which is stable. He is now complaining of bilateral low back pain and this has been present for the last 4 months.  The pain stops him sleeping well at night.  He noticed for the last couple of weeks, he has also pain in the left thigh.  This may be radiation from the back but I am not sure. He continues to take amlodipine and losartan for his hypertension. He continues on metoprolol also. He continues on Plavix in view of his previous history of cerebrovascular disease. He continues on testosterone therapy as before. He continues on statin therapy in view of his cerebrovascular disease. Past Medical History:  Diagnosis Date  . Anxiety and depression   . Chest pain 2005   03/2012: neg nuclear stress.  2005- Cardiac cath:20-40% lesions in the LAD, circumflex and RCA with normal EF  . CVA (cerebral vascular accident) (HCC)   . Degenerative joint disease   . Erectile dysfunction 05/11/2019  . Hyperlipidemia   . Hypertension   . Tobacco abuse    40 pack year total consumption  . Vitamin D deficiency disease 05/11/2019   Past Surgical History:  Procedure Laterality Date  . APPENDECTOMY    . CORONARY STENT INTERVENTION N/A 12/26/2018   Procedure:  CORONARY STENT INTERVENTION;  Surgeon: Lennette Bihari, MD;  Location: [facility] INVASIVE CV LAB;  Service: Cardiovascular;  Laterality: N/A;  . LEFT HEART CATH AND CORONARY ANGIOGRAPHY N/A 12/26/2018   Procedure: LEFT HEART CATH AND CORONARY ANGIOGRAPHY;  Surgeon: Lennette Bihari, MD;  Location: [facility] INVASIVE CV LAB;  Service: Cardiovascular;  Laterality: N/A;     Family History  Problem Relation Age of Onset  . Coronary artery disease Father 59       Fatal myocardial infarction  . Cancer Father   . Cancer Brother   . COPD Mother   . Cancer Mother     Social History   Social History Narrative   Widower,remarried for last 13 months. Lives with wife..Part time job.   Social History   Tobacco Use  . Smoking status: Former Smoker    Packs/day: 1.00    Years: 40.00    Pack years: 40.00  . Smokeless tobacco: Never Used  Substance Use Topics  . Alcohol use: No    Current Meds  Medication Sig  . amLODipine (NORVASC) 5 MG tablet Take 1 tablet (5 mg total) by mouth daily.  Marland Kitchen CAREPOINT SAFETY1ST SYR/NEEDLE 23G X 1" 1 ML MISC USE AS DIRECTED.  Marland Kitchen CAREPOINT SAFETY1ST SYR/NEEDLE 23G X 1" 1 ML MISC USE AS DIRECTED.ED  . Cholecalciferol (VITAMIN D) 125 MCG (5000 UT) CAPS Take 5,000 Units by mouth daily.  . nitroGLYCERIN (NITROSTAT) 0.4 MG  SL tablet Place 1 tablet (0.4 mg total) under the tongue every 5 (five) minutes x 3 doses as needed for chest pain.  . tadalafil (CIALIS) 20 MG tablet TAKE UP TO 2 TABLETS ONCE DAILY AS DIRECTED FOR ERECTILE DYSFUNCTION  . testosterone cypionate (DEPOTESTOSTERONE CYPIONATE) 200 MG/ML injection ADMINISTER 0.6 ML IN THE MUSCLE 2 TIMES A WEEK  . TUBERCULIN SYR 1CC/25GX5/8" 25G X 5/8" 1 ML MISC   . [DISCONTINUED] amLODipine (NORVASC) 5 MG tablet Take 1 tablet (5 mg total) by mouth daily.  . [DISCONTINUED] atorvastatin (LIPITOR) 80 MG tablet Take 1 tablet (80 mg total) by mouth daily at 6 PM.  . [DISCONTINUED] clopidogrel (PLAVIX) 75 MG tablet Take 1 tablet (75 mg  total) by mouth daily.  . [DISCONTINUED] losartan (COZAAR) 50 MG tablet Take 1 tablet (50 mg total) by mouth daily.  . [DISCONTINUED] metoprolol succinate (TOPROL-XL) 50 MG 24 hr tablet Take 1 tablet (50 mg total) by mouth daily.  . [DISCONTINUED] pantoprazole (PROTONIX) 40 MG tablet Take 1 tablet (40 mg total) by mouth daily.       Objective:   Today's Vitals: BP 140/80 (BP Location: Left Arm, Patient Position: Sitting, Cuff Size: Normal)   Pulse 88   Temp (!) 97.1 F (36.2 C) (Temporal)   Resp 18   Ht 5\' 6"  (1.676 m)   Wt 204 lb (92.5 kg)   SpO2 98%   BMI 32.93 kg/m  Vitals with BMI 12/19/2020 11/03/2020 08/18/2020  Height 5\' 6"  5\' 6"  5\' 6"   Weight 204 lbs 201 lbs 6 oz 207 lbs  BMI 32.94 32.52 33.43  Systolic 140 128 10/16/2020  Diastolic 80 70 88  Pulse 88 77 89     Physical Exam He looks systemically well.  His weight is stable.  Blood pressure slightly elevated today compared to previous.  Alert and orientated without any focal logical signs.      Assessment   1. Chronic bilateral low back pain without sciatica   2. Essential hypertension   3. Low testosterone   4. Mixed hyperlipidemia   5. Vitamin D deficiency disease       Tests ordered Orders Placed This Encounter  Procedures  . Basic metabolic panel  . Ambulatory referral to Orthopedic Surgery     Plan: 1. I will refer him to orthopedics for his chronic low back pain for further evaluation.  The left leg pain may be related to this possibly. 2. He will continue with amlodipine, losartan and metoprolol for hypertension.  We will check renal function.  I am concerned that if his renal function continues to get worse, I will then refer him to nephrology.  I have told the patient this before. 3. Continue with testosterone therapy as before and his levels were in a good range previously. 4. Continue with statin therapy as before. 5. Further recommendations will depend on blood results and I will see him in October  for his annual physical exam   Meds ordered this encounter  Medications  . amLODipine (NORVASC) 5 MG tablet    Sig: Take 1 tablet (5 mg total) by mouth daily.    Dispense:  90 tablet    Refill:  1  . DISCONTD: atorvastatin (LIPITOR) 80 MG tablet    Sig: Take 1 tablet (80 mg total) by mouth daily at 6 PM.    Dispense:  90 tablet    Refill:  1  . DISCONTD: clopidogrel (PLAVIX) 75 MG tablet    Sig: Take 1  tablet (75 mg total) by mouth daily.    Dispense:  90 tablet    Refill:  1  . DISCONTD: losartan (COZAAR) 50 MG tablet    Sig: Take 1 tablet (50 mg total) by mouth daily.    Dispense:  90 tablet    Refill:  1  . DISCONTD: metoprolol succinate (TOPROL-XL) 50 MG 24 hr tablet    Sig: Take 1 tablet (50 mg total) by mouth daily.    Dispense:  90 tablet    Refill:  1  . DISCONTD: pantoprazole (PROTONIX) 40 MG tablet    Sig: Take 1 tablet (40 mg total) by mouth daily.    Dispense:  90 tablet    Refill:  1  . amLODipine (NORVASC) 5 MG tablet    Sig: Take 1 tablet (5 mg total) by mouth daily.    Dispense:  30 tablet    Refill:  0  . atorvastatin (LIPITOR) 80 MG tablet    Sig: Take 1 tablet (80 mg total) by mouth daily at 6 PM.    Dispense:  30 tablet    Refill:  0  . clopidogrel (PLAVIX) 75 MG tablet    Sig: Take 1 tablet (75 mg total) by mouth daily.    Dispense:  30 tablet    Refill:  0  . losartan (COZAAR) 50 MG tablet    Sig: Take 1 tablet (50 mg total) by mouth daily.    Dispense:  30 tablet    Refill:  0  . metoprolol succinate (TOPROL-XL) 50 MG 24 hr tablet    Sig: Take 1 tablet (50 mg total) by mouth daily.    Dispense:  30 tablet    Refill:  0  . pantoprazole (PROTONIX) 40 MG tablet    Sig: Take 1 tablet (40 mg total) by mouth daily.    Dispense:  30 tablet    Refill:  0    Merrianne Mccumbers Normajean Glasgow, MD

## 2020-12-20 LAB — BASIC METABOLIC PANEL
BUN/Creatinine Ratio: 12 (calc) (ref 6–22)
BUN: 16 mg/dL (ref 7–25)
CO2: 29 mmol/L (ref 20–32)
Calcium: 9.4 mg/dL (ref 8.6–10.3)
Chloride: 101 mmol/L (ref 98–110)
Creat: 1.35 mg/dL — ABNORMAL HIGH (ref 0.70–1.25)
Glucose, Bld: 92 mg/dL (ref 65–139)
Potassium: 4.7 mmol/L (ref 3.5–5.3)
Sodium: 136 mmol/L (ref 135–146)

## 2020-12-28 ENCOUNTER — Ambulatory Visit: Payer: Medicare Other

## 2020-12-28 ENCOUNTER — Telehealth: Payer: Self-pay | Admitting: Cardiology

## 2020-12-28 ENCOUNTER — Ambulatory Visit: Payer: Medicare Other | Admitting: Orthopedic Surgery

## 2020-12-28 ENCOUNTER — Other Ambulatory Visit: Payer: Self-pay

## 2020-12-28 ENCOUNTER — Encounter: Payer: Self-pay | Admitting: Orthopedic Surgery

## 2020-12-28 VITALS — BP 149/90 | HR 96 | Ht 66.0 in | Wt 203.0 lb

## 2020-12-28 DIAGNOSIS — M545 Low back pain, unspecified: Secondary | ICD-10-CM | POA: Diagnosis not present

## 2020-12-28 DIAGNOSIS — G8929 Other chronic pain: Secondary | ICD-10-CM

## 2020-12-28 NOTE — Telephone Encounter (Signed)
Agree.   If he feels better and does not want to to resume the atorvastatin he needs to come back and see one of our Pharm.D.'s in the lipid clinic.

## 2020-12-28 NOTE — Telephone Encounter (Signed)
.  Pt c/o medication issue:  1. Name of Medication:Atorvastatin   2. How are you currently taking this medication (dosage and times per day)? 1 tablet a day*  3. Are you having a reaction (difficulty breathing--STAT)? no*  4. What is your medication issue? Muscle spasm on both sides of his chest. Also, lower back pain and leg muscle and arms are huting

## 2020-12-28 NOTE — Progress Notes (Signed)
New Patient Visit  Assessment: Michael Cortez is a 65 y.o. male with the following: 1. Chronic low back pain without sciatica, unspecified back pain laterality   Plan: [name] has low back pain, without any radiating pains down his legs.  He does describe some muscular pains, within his torso, as well as his thighs, which she believes could be related to his cholesterol medications.  I have encouraged him to discuss this with his primary care provider, as muscle pains are common complaint with these medications.  We reviewed radiographs which demonstrates some degenerative changes, without listhesis.  For his lower back pain, he can continue to take medications as needed, and I have given him some exercises for him to complete at home.  We also briefly discussed trying a different mattress at bedtime, as he notes his pain worsens when he goes to sleep.   Follow-up: No follow-ups on file.  Subjective:  Chief Complaint  Patient presents with  . Back Pain    Pt states he had some lower back pain that has now moved up into his ribs for 1 month. NKI thinks it may be coming from his cholesterol medications.     History of Present Illness: Michael Cortez is a 65 y.o. male who has been referred to clinic today by Lilly Cove, MD for evaluation of low back pain.  He states he had back pain for years, but is primarily been in his lower back.  When he is active, the pain does not bother him.  He notes that the pain worsens when he lays down at night.  More recently, the pain is extending across his entire lower back.  He does not take medications on a consistent basis.  No previous home exercise program, or physical therapy exercises.  He denies any radiating symptoms into his legs.  No numbness and tingling when he gets up and ambulates.  He denies a specific injury or inciting event.  More recently, he is starting to have muscular pain within his ribs, as well as in his thighs.  He is  concerned that this could be related to his cholesterol medications.   Review of Systems: No fevers or chills No numbness or tingling No chest pain No shortness of breath No bowel or bladder dysfunction No GI distress No headaches   Medical History:  Past Medical History:  Diagnosis Date  . Anxiety and depression   . Chest pain 2005   03/2012: neg nuclear stress.  2005- Cardiac cath:20-40% lesions in the LAD, circumflex and RCA with normal EF  . CVA (cerebral vascular accident) (HCC)   . Degenerative joint disease   . Erectile dysfunction 05/11/2019  . Hyperlipidemia   . Hypertension   . Tobacco abuse    40 pack year total consumption  . Vitamin D deficiency disease 05/11/2019    Past Surgical History:  Procedure Laterality Date  . APPENDECTOMY    . CORONARY STENT INTERVENTION N/A 12/26/2018   Procedure: CORONARY STENT INTERVENTION;  Surgeon: Lennette Bihari, MD;  Location: [facility] INVASIVE CV LAB;  Service: Cardiovascular;  Laterality: N/A;  . LEFT HEART CATH AND CORONARY ANGIOGRAPHY N/A 12/26/2018   Procedure: LEFT HEART CATH AND CORONARY ANGIOGRAPHY;  Surgeon: Lennette Bihari, MD;  Location: [facility] INVASIVE CV LAB;  Service: Cardiovascular;  Laterality: N/A;    Family History  Problem Relation Age of Onset  . Coronary artery disease Father 46       Fatal myocardial infarction  .  Cancer Father   . Cancer Brother   . COPD Mother   . Cancer Mother    Social History   Tobacco Use  . Smoking status: Former Smoker    Packs/day: 1.00    Years: 40.00    Pack years: 40.00  . Smokeless tobacco: Never Used  Substance Use Topics  . Alcohol use: No  . Drug use: No    No Known Allergies  Current Meds  Medication Sig  . amLODipine (NORVASC) 5 MG tablet Take 1 tablet (5 mg total) by mouth daily.  Marland Kitchen amLODipine (NORVASC) 5 MG tablet Take 1 tablet (5 mg total) by mouth daily.  Marland Kitchen atorvastatin (LIPITOR) 80 MG tablet Take 1 tablet (80 mg total) by mouth daily at 6 PM.  . CAREPOINT  SAFETY1ST SYR/NEEDLE 23G X 1" 1 ML MISC USE AS DIRECTED.  Marland Kitchen CAREPOINT SAFETY1ST SYR/NEEDLE 23G X 1" 1 ML MISC USE AS DIRECTED.ED  . Cholecalciferol (VITAMIN D) 125 MCG (5000 UT) CAPS Take 5,000 Units by mouth daily.  . clopidogrel (PLAVIX) 75 MG tablet Take 1 tablet (75 mg total) by mouth daily.  Marland Kitchen losartan (COZAAR) 50 MG tablet Take 1 tablet (50 mg total) by mouth daily.  . metoprolol succinate (TOPROL-XL) 50 MG 24 hr tablet Take 1 tablet (50 mg total) by mouth daily.  . nitroGLYCERIN (NITROSTAT) 0.4 MG SL tablet Place 1 tablet (0.4 mg total) under the tongue every 5 (five) minutes x 3 doses as needed for chest pain.  . pantoprazole (PROTONIX) 40 MG tablet Take 1 tablet (40 mg total) by mouth daily.  . tadalafil (CIALIS) 20 MG tablet TAKE UP TO 2 TABLETS ONCE DAILY AS DIRECTED FOR ERECTILE DYSFUNCTION  . testosterone cypionate (DEPOTESTOSTERONE CYPIONATE) 200 MG/ML injection ADMINISTER 0.6 ML IN THE MUSCLE 2 TIMES A WEEK  . TUBERCULIN SYR 1CC/25GX5/8" 25G X 5/8" 1 ML MISC     Objective: BP (!) 149/90   Pulse 96   Ht 5\' 6"  (1.676 m)   Wt 203 lb (92.1 kg)   BMI 32.77 kg/m   Physical Exam:  General: Alert and oriented.  No acute distress Gait: Normal gait  Evaluation of his lower back demonstrates no deformity.  He has mild tenderness to palpation at the midline of his lower back.  No pain with straight leg raise.  Sensation is intact in all dermatomes bilaterally.  2+ patellar tendon reflexes.  Strength in his bilateral lower extremities is 5/5    IMAGING: I personally ordered and reviewed the following images   Standing lumbar spine x-rays were obtained in clinic today and demonstrates no acute injuries.  Mild degenerative changes are noted throughout the lumbar spine.  Disc heights are well-maintained.  No anterolisthesis.  Impression: Lumbar spine with mild degenerative changes.   New Medications:  No orders of the defined types were placed in this encounter.     , MD  12/28/2020 10:36 PM

## 2020-12-28 NOTE — Telephone Encounter (Signed)
Spoke to patient, patient reports muscle aches/soreness in arms and legs x 1 month.  Also reports muscle spasms in the middle of the night that wake him up.   He had recent labs by PCP-these were okay.   He believes this may be coming from his atorvastatin (Lipitor).   Advised to hold medication for 2 weeks to see if symptoms resolve.  Request to call back in 2 weeks to update on symptoms.   Patient aware and verbalized understanding.

## 2021-01-11 NOTE — Telephone Encounter (Signed)
Patient has been advised of Dr. Jenene Slicker recommendation. Message sent to scheduling to set up an appointment.

## 2021-01-11 NOTE — Telephone Encounter (Signed)
Patient following up 2 weeks later after stopping medication. Patient reports all symptoms have resolved. Patient is not sure what to do next. Please advise

## 2021-01-18 ENCOUNTER — Other Ambulatory Visit (INDEPENDENT_AMBULATORY_CARE_PROVIDER_SITE_OTHER): Payer: Self-pay

## 2021-01-18 ENCOUNTER — Telehealth (INDEPENDENT_AMBULATORY_CARE_PROVIDER_SITE_OTHER): Payer: Self-pay

## 2021-01-18 MED ORDER — TESTOSTERONE CYPIONATE 200 MG/ML IM SOLN: 120.0000 mg | INTRAMUSCULAR | 2 refills | Status: DC

## 2021-01-18 NOTE — Telephone Encounter (Signed)
Received a fax from [facility] for a refill of the following prescriptions:  testosterone cypionate (DEPOTESTOSTERONE CYPIONATE) 200 MG/ML injection This has a note to send tomorroe  The fax is also requesting Syringes 82mL x 21G x 1 AND Needles 22G x 1  Last OV 12/19/2020  Next OV 05/25/2021

## 2021-01-18 NOTE — Telephone Encounter (Signed)
I have sent the testosterone prescription.  The needles and syringes quoted are incorrect for the testosterone injections.  Which pharmacy does he use?

## 2021-01-19 ENCOUNTER — Other Ambulatory Visit (INDEPENDENT_AMBULATORY_CARE_PROVIDER_SITE_OTHER): Payer: Self-pay | Admitting: Internal Medicine

## 2021-01-19 MED ORDER — TESTOSTERONE CYPIONATE 200 MG/ML IM SOLN: 120.0000 mg | INTRAMUSCULAR | 2 refills | Status: DC

## 2021-01-19 MED ORDER — "BD SAFETYGLIDE NEEDLE 25G X 1"" MISC": 1.0000 [IU] | 2 refills | Status: AC

## 2021-01-19 MED ORDER — "TUBERCULIN SYRINGE 25G X 5/8"" 1 ML MISC": 1.0000 [IU] | 2 refills | Status: AC

## 2021-01-19 NOTE — Telephone Encounter (Signed)
[facility] called and stated that they are out of the syringes and needles that you sent.  Called patient and let him know that [facility] is out of stock of his needles and syringes and he stated that he has some extra and will call them to see if they have in stock when he needs them. Patient verbalized an understanding.

## 2021-01-24 NOTE — Telephone Encounter (Signed)
Pt have appt on June 27th with pharmacist

## 2021-01-27 ENCOUNTER — Other Ambulatory Visit (INDEPENDENT_AMBULATORY_CARE_PROVIDER_SITE_OTHER): Payer: Self-pay | Admitting: Internal Medicine

## 2021-02-06 ENCOUNTER — Ambulatory Visit: Payer: Medicare Other | Admitting: Pharmacist Clinician (PhC)/ Clinical Pharmacy Specialist

## 2021-02-06 ENCOUNTER — Other Ambulatory Visit: Payer: Self-pay

## 2021-02-06 VITALS — BP 178/102 | HR 79 | Resp 15 | Ht 66.0 in | Wt 207.4 lb

## 2021-02-06 DIAGNOSIS — E782 Mixed hyperlipidemia: Secondary | ICD-10-CM

## 2021-02-06 DIAGNOSIS — E785 Hyperlipidemia, unspecified: Secondary | ICD-10-CM

## 2021-02-06 NOTE — Patient Instructions (Signed)
Your Results:             Your most recent labs (on atorvastatin) Goal  Total Cholesterol 123 < 200  Triglycerides 101 < 150  HDL (happy/good cholesterol) 46 > 40  LDL (lousy/bad cholesterol 59 < 70     Medication changes:  We will start paperwork to get Repatha/Praluent covered by your insurance.  Once approved Michael Cortez will call you to let you know it is ready to pick up.   Lab orders:  Go to the lab today to check baseline cholesterol level (without any medication)  Patient Assistance:  The [employer] foundation offers assistance to help pay for medication copays.  They will cover copays for all cholesterol lowering meds, including statins, fibrates, omega-3 oils, ezetimibe, Repatha, Praluent, Nexletol, Nexlizet.  The cards are usually good for $2,500 or 12 months, whichever comes first. Go to healthwellfoundation.org Click on "Apply Now" Answer questions as to whom is applying (patient or representative) Your disease fund will be "hypercholesterolemia - Medicare access" They will ask questions about finances and which medications you are taking for cholesterol When you submit, the approval is usually within minutes.  You will need to print the card information from the site You will need to show this information to your pharmacy, they will bill your Medicare Part D plan first -then bill [employer] --for the copay.   You can also call them at [phone], although the hold times can be quite long.   Thank you for choosing [facility]

## 2021-02-06 NOTE — Progress Notes (Signed)
02/06/2021 Michael Cortez 10-Sep-1955 [id number]   HPI:  Michael Cortez is a 65 y.o. male patient of Dr Antoine Poche, who presents today for a lipid clinic evaluation.  See pertinent past medical history below.   Patient was recently started on atorvastatin 80 mg, however was unable to tolerate this due to myalgias and chest wall spasms.  His last dose was in early May and symptoms resolved within 2 weeks of discontinuation.     Past Medical History: ASCVD NSTEMI 12/2018, bilateral carotid stenosis (monitored yearly), CVA (2017) with no residual defects  CHF EF 45-50% by echo (2020)  Hypertension Elevated in office today, states always better at home, was 164/86 on recheck  CKD 5/22 SCr improved to 1.35, had been as high as 1.57 in 2021   Current Medications: none  Cholesterol Goals: LDL < 70   Intolerant/previously tried: atorvastatin caused myalgias, spasms in chest wall; rosuvastatin - myalgias  Family history: father had MI at 3, died at 28 from second MI; mother had COPD, breathing, died at 39; siblings healthy - 1 brother; died DM, 1 sister deceased breathing 1 son, healthy  Diet: mostly home cooked, mostly chicken, occasional hamburger; lots of vegetables (all);   Exercise:  work around the house, dragging brush, mowing  Labs:  10/21: TC 123, TG 101, HDL 46, LDL 59 (on atorvastatin 80 mg)   Current Outpatient Medications  Medication Sig Dispense Refill   amLODipine (NORVASC) 5 MG tablet Take 1 tablet (5 mg total) by mouth daily. 90 tablet 1   Cholecalciferol (VITAMIN D) 125 MCG (5000 UT) CAPS Take 5,000 Units by mouth daily.     clopidogrel (PLAVIX) 75 MG tablet Take 1 tablet (75 mg total) by mouth daily. 30 tablet 0   losartan (COZAAR) 50 MG tablet Take 1 tablet (50 mg total) by mouth daily. 30 tablet 0   metoprolol succinate (TOPROL-XL) 50 MG 24 hr tablet Take 1 tablet (50 mg total) by mouth daily. 30 tablet 0   NEEDLE, DISP, 25 G (BD SAFETYGLIDE NEEDLE) 25G X 1" MISC 1  Units by Does not apply route 2 (two) times a week. 100 each 2   nitroGLYCERIN (NITROSTAT) 0.4 MG SL tablet Place 1 tablet (0.4 mg total) under the tongue every 5 (five) minutes x 3 doses as needed for chest pain. 30 tablet 5   pantoprazole (PROTONIX) 40 MG tablet Take 1 tablet (40 mg total) by mouth daily. 30 tablet 0   tadalafil (CIALIS) 20 MG tablet TAKE UP TO 2 TABLETS ONCE DAILY AS DIRECTED FOR ERECTILE DYSFUNCTION 30 tablet 3   testosterone cypionate (DEPOTESTOSTERONE CYPIONATE) 200 MG/ML injection Inject 0.6 mLs (120 mg total) into the muscle 2 (two) times a week. 10 mL 2   TUBERCULIN SYR 1CC/25GX5/8" 25G X 5/8" 1 ML MISC Inject 1 Units into the muscle 2 (two) times a week. 100 each 2   No current facility-administered medications for this visit.    Allergies  Allergen Reactions   Atorvastatin     myalgias   Crestor [Rosuvastatin]     myalgias    Past Medical History:  Diagnosis Date   Anxiety and depression    Chest pain 2005   03/2012: neg nuclear stress.  2005- Cardiac cath:20-40% lesions in the LAD, circumflex and RCA with normal EF   CVA (cerebral vascular accident) (HCC)    Degenerative joint disease    Erectile dysfunction 05/11/2019   Hyperlipidemia    Hypertension    Tobacco abuse  40 pack year total consumption   Vitamin D deficiency disease 05/11/2019    Blood pressure (!) 178/102, pulse 79, resp. rate 15, height 5\' 6"  (1.676 m), weight 207 lb 6.4 oz (94.1 kg), SpO2 94 %.   Hyperlipidemia Patient with ASCVD and hyperlipidemia.  Most recent lipid labs showed LDL to be at goal at 59.  However this was before the need to discontinue atorvastatin.  He has now been off medication for almost 2 months, so will repeat labs today in office.  Reviewed non-statin options for lowering LDL cholesterol, including ezetimibe, PCSK-9 inhibitors, bempedoic acid and inclisiran.  Discussed mechanisms of action, dosing, side effects and potential decreases in LDL cholesterol.    Answered all questions.  In agreement that we will wait until the results of today's lab is available.  Once we know how much cholesterol lowering is needed, we can determine the best option.     PharmD CPP [facility] [address] [place], [place] [phone]

## 2021-02-06 NOTE — Assessment & Plan Note (Signed)
Patient with ASCVD and hyperlipidemia.  Most recent lipid labs showed LDL to be at goal at 59.  However this was before the need to discontinue atorvastatin.  He has now been off medication for almost 2 months, so will repeat labs today in office.  Reviewed non-statin options for lowering LDL cholesterol, including ezetimibe, PCSK-9 inhibitors, bempedoic acid and inclisiran.  Discussed mechanisms of action, dosing, side effects and potential decreases in LDL cholesterol.   Answered all questions.  In agreement that we will wait until the results of today's lab is available.  Once we know how much cholesterol lowering is needed, we can determine the best option.

## 2021-02-07 ENCOUNTER — Telehealth: Payer: Self-pay | Admitting: Pharmacist Clinician (PhC)/ Clinical Pharmacy Specialist

## 2021-02-07 LAB — LIPID PANEL
Chol/HDL Ratio: 4.1 ratio (ref 0.0–5.0)
Cholesterol, Total: 219 mg/dL — ABNORMAL HIGH (ref 100–199)
HDL: 54 mg/dL (ref >39)
LDL Chol Calc (NIH): 144 mg/dL — ABNORMAL HIGH (ref 0–99)
Triglycerides: 119 mg/dL (ref 0–149)
VLDL Cholesterol Cal: 21 mg/dL (ref 5–40)

## 2021-02-07 NOTE — Telephone Encounter (Signed)
Will route to Michael Cortez as this pt was just seen yesterday and I wasn't instructed to do  pa yet I will see what she would like to do.

## 2021-02-07 NOTE — Telephone Encounter (Signed)
    Pt c/o medication issue:  1. Name of Medication: Repatha/Praluent  2. How are you currently taking this medication (dosage and times per day)?   3. Are you having a reaction (difficulty breathing--STAT)?   4. What is your medication issue? Pt said, to tell Belenda Cruise not to send this meds anymore. He thinks he can't keep up with this meds and can't afford $130 a month copay

## 2021-02-09 NOTE — Telephone Encounter (Signed)
Reviewed labs with patient.  Unfortunately he cannot afford copays - will keep track of when [employer] is available again and notify patient.

## 2021-03-29 ENCOUNTER — Telehealth (INDEPENDENT_AMBULATORY_CARE_PROVIDER_SITE_OTHER): Payer: Self-pay

## 2021-03-29 DIAGNOSIS — K219 Gastro-esophageal reflux disease without esophagitis: Secondary | ICD-10-CM

## 2021-03-29 DIAGNOSIS — I10 Essential (primary) hypertension: Secondary | ICD-10-CM

## 2021-03-29 DIAGNOSIS — N529 Male erectile dysfunction, unspecified: Secondary | ICD-10-CM

## 2021-03-29 DIAGNOSIS — I2510 Atherosclerotic heart disease of native coronary artery without angina pectoris: Secondary | ICD-10-CM

## 2021-03-29 NOTE — Telephone Encounter (Signed)
Patient called and left a detailed voice message that he needs refills of his medications sent to [facility] including his tadalafil.

## 2021-03-30 ENCOUNTER — Encounter (INDEPENDENT_AMBULATORY_CARE_PROVIDER_SITE_OTHER): Payer: Self-pay | Admitting: Nurse Practitioner

## 2021-03-30 MED ORDER — AMLODIPINE BESYLATE 5 MG PO TABS: 5.0000 mg | ORAL_TABLET | Freq: Every day | ORAL | 0 refills | Status: AC

## 2021-03-30 MED ORDER — TADALAFIL 20 MG PO TABS: 20.0000 mg | ORAL_TABLET | Freq: Every day | ORAL | 3 refills | Status: AC | PRN

## 2021-03-30 MED ORDER — LOSARTAN POTASSIUM 50 MG PO TABS
50.0000 mg | ORAL_TABLET | Freq: Every day | ORAL | 0 refills | Status: AC
Start: 2021-03-30 — End: ?

## 2021-03-30 MED ORDER — CLOPIDOGREL BISULFATE 75 MG PO TABS: 75.0000 mg | ORAL_TABLET | Freq: Every day | ORAL | 0 refills | Status: AC

## 2021-03-30 MED ORDER — METOPROLOL SUCCINATE ER 50 MG PO TB24: 50.0000 mg | ORAL_TABLET | Freq: Every day | ORAL | 0 refills | Status: AC

## 2021-03-30 MED ORDER — PANTOPRAZOLE SODIUM 40 MG PO TBEC: 40.0000 mg | DELAYED_RELEASE_TABLET | Freq: Every day | ORAL | 0 refills | Status: AC

## 2021-03-30 MED ORDER — TESTOSTERONE CYPIONATE 200 MG/ML IM SOLN
INTRAMUSCULAR | 0 refills | Status: AC
Start: 2021-03-30 — End: ?

## 2021-03-30 NOTE — Telephone Encounter (Signed)
I have approved most of his medications and sent them to [facility].  After doing chart review I do not see that the patient has a diagnosis of hypogonadism, in addition last time his testosterone was checked in his blood it was quite elevated.  I have recommended he taper off of the medication.  I have discussed how this patient can do this and he was agreeable to the tapering.  I have sent in a refill of testosterone to [facility] that reflects the tapering instructions.  I will also reach out to him through his MyChart to give him names of offices that do the bioidentical hormone replacement therapy that he can reach out to if he would like to consider continuing his testosterone.  Patient does have nitroglycerin on his medication list as well as tadalafil.  We did discuss that he should not take nitroglycerin if he has taken tadalafil within the last 48 hours or so.  He tells me he is aware of this and plans to call 911 if he experiences any chest pain or other signs of heart attack.

## 2021-04-18 DIAGNOSIS — I25118 Atherosclerotic heart disease of native coronary artery with other forms of angina pectoris: Secondary | ICD-10-CM | POA: Diagnosis not present

## 2021-04-18 DIAGNOSIS — F17210 Nicotine dependence, cigarettes, uncomplicated: Secondary | ICD-10-CM | POA: Diagnosis not present

## 2021-04-18 DIAGNOSIS — Z8673 Personal history of transient ischemic attack (TIA), and cerebral infarction without residual deficits: Secondary | ICD-10-CM | POA: Diagnosis not present

## 2021-04-18 DIAGNOSIS — Z299 Encounter for prophylactic measures, unspecified: Secondary | ICD-10-CM | POA: Diagnosis not present

## 2021-04-18 DIAGNOSIS — I10 Essential (primary) hypertension: Secondary | ICD-10-CM | POA: Diagnosis not present

## 2021-04-18 DIAGNOSIS — K219 Gastro-esophageal reflux disease without esophagitis: Secondary | ICD-10-CM | POA: Diagnosis not present

## 2021-04-21 DIAGNOSIS — E559 Vitamin D deficiency, unspecified: Secondary | ICD-10-CM | POA: Diagnosis not present

## 2021-04-21 DIAGNOSIS — E349 Endocrine disorder, unspecified: Secondary | ICD-10-CM | POA: Diagnosis not present

## 2021-05-11 DIAGNOSIS — F17210 Nicotine dependence, cigarettes, uncomplicated: Secondary | ICD-10-CM | POA: Diagnosis not present

## 2021-05-11 DIAGNOSIS — I10 Essential (primary) hypertension: Secondary | ICD-10-CM | POA: Diagnosis not present

## 2021-05-11 DIAGNOSIS — Z0000 Encounter for general adult medical examination without abnormal findings: Secondary | ICD-10-CM | POA: Diagnosis not present

## 2021-05-11 DIAGNOSIS — Z299 Encounter for prophylactic measures, unspecified: Secondary | ICD-10-CM | POA: Diagnosis not present

## 2021-05-11 DIAGNOSIS — Z7189 Other specified counseling: Secondary | ICD-10-CM | POA: Diagnosis not present

## 2021-05-15 DIAGNOSIS — Z79899 Other long term (current) drug therapy: Secondary | ICD-10-CM | POA: Diagnosis not present

## 2021-05-19 DIAGNOSIS — Z1211 Encounter for screening for malignant neoplasm of colon: Secondary | ICD-10-CM | POA: Diagnosis not present

## 2021-05-19 DIAGNOSIS — Z1212 Encounter for screening for malignant neoplasm of rectum: Secondary | ICD-10-CM | POA: Diagnosis not present

## 2021-05-25 ENCOUNTER — Encounter (INDEPENDENT_AMBULATORY_CARE_PROVIDER_SITE_OTHER): Payer: Medicare Other | Admitting: Internal Medicine

## 2021-05-27 LAB — EXTERNAL GENERIC LAB PROCEDURE: COLOGUARD: NEGATIVE

## 2021-06-07 ENCOUNTER — Other Ambulatory Visit (INDEPENDENT_AMBULATORY_CARE_PROVIDER_SITE_OTHER): Payer: Self-pay | Admitting: Nurse Practitioner

## 2021-06-07 DIAGNOSIS — I2510 Atherosclerotic heart disease of native coronary artery without angina pectoris: Secondary | ICD-10-CM

## 2021-06-07 DIAGNOSIS — I10 Essential (primary) hypertension: Secondary | ICD-10-CM

## 2021-06-07 DIAGNOSIS — K219 Gastro-esophageal reflux disease without esophagitis: Secondary | ICD-10-CM

## 2021-06-21 DIAGNOSIS — I10 Essential (primary) hypertension: Secondary | ICD-10-CM | POA: Diagnosis not present

## 2021-06-21 DIAGNOSIS — E349 Endocrine disorder, unspecified: Secondary | ICD-10-CM | POA: Diagnosis not present

## 2021-06-21 DIAGNOSIS — E440 Moderate protein-calorie malnutrition: Secondary | ICD-10-CM | POA: Diagnosis not present

## 2021-06-21 DIAGNOSIS — D692 Other nonthrombocytopenic purpura: Secondary | ICD-10-CM | POA: Diagnosis not present

## 2021-06-21 DIAGNOSIS — Z299 Encounter for prophylactic measures, unspecified: Secondary | ICD-10-CM | POA: Diagnosis not present

## 2021-06-27 DIAGNOSIS — E349 Endocrine disorder, unspecified: Secondary | ICD-10-CM | POA: Diagnosis not present

## 2021-07-17 DIAGNOSIS — I6529 Occlusion and stenosis of unspecified carotid artery: Secondary | ICD-10-CM | POA: Insufficient documentation

## 2021-07-17 NOTE — Progress Notes (Signed)
Cardiology Office Note   Date:  07/19/2021   ID:  Michael Cortez, DOB 15-Jan-1956, MRN [id number]  PCP:  Ignatius Specking, MD  Cardiologist:   Rollene Rotunda, MD   Chief Complaint  Patient presents with   Dizziness       History of Present Illness:  Michael Cortez is a 65 y.o. male  who was admited in May of 2020 with a NSTEMI.  He had a cath with results listed below.  He had a mild/mod reduced EFof 45 - 50%.  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     He presents for follow up.  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  He is having occasional episodes of dizziness.  He says he feels like he might pass out but he does not have syncope.  Its lasting 5 to 15 seconds.  Is happening couple times per week.  It is when he is up and moving around.  He has not noticed it with position changes.  He does not have any noticeable palpitations.  He has not had any chest pressure, neck or arm discomfort.  He not had any weight gain or edema.   Past Medical History:  Diagnosis Date   Anxiety and depression    Chest pain 2005   03/2012: neg nuclear stress.  2005- Cardiac cath:20-40% lesions in the LAD, circumflex and RCA with normal EF   CVA (cerebral vascular accident) (HCC)    Degenerative joint disease    Erectile dysfunction 05/11/2019   Hyperlipidemia    Hypertension    Tobacco abuse    40 pack year total consumption   Vitamin D deficiency disease 05/11/2019    Past Surgical History:  Procedure Laterality Date   APPENDECTOMY     CORONARY STENT INTERVENTION N/A 12/26/2018   Procedure: CORONARY STENT INTERVENTION;  Surgeon: Lennette Bihari, MD;  Location: [facility];  Service: Cardiovascular;  Laterality: N/A;   LEFT HEART CATH AND CORONARY ANGIOGRAPHY N/A 12/26/2018   Procedure: LEFT HEART CATH AND CORONARY ANGIOGRAPHY;  Surgeon: Lennette Bihari, MD;  Location: [facility];  Service: Cardiovascular;  Laterality: N/A;      Current Outpatient Medications  Medication Sig Dispense Refill   amLODipine (NORVASC) 5 MG tablet Take 1 tablet (5 mg total) by mouth daily. 90 tablet 0   Cholecalciferol (VITAMIN D) 125 MCG (5000 UT) CAPS Take 5,000 Units by mouth daily.     clopidogrel (PLAVIX) 75 MG tablet Take 1 tablet (75 mg total) by mouth daily. 90 tablet 0   losartan (COZAAR) 50 MG tablet Take 1 tablet (50 mg total) by mouth daily. 90 tablet 0   metoprolol succinate (TOPROL-XL) 50 MG 24 hr tablet Take 1 tablet (50 mg total) by mouth daily. 90 tablet 0   nitroGLYCERIN (NITROSTAT) 0.4 MG SL tablet Place 1 tablet (0.4 mg total) under the tongue every 5 (five) minutes x 3 doses as needed for chest pain. 30 tablet 5   pantoprazole (PROTONIX) 40 MG tablet Take 1 tablet (40 mg total) by mouth daily. 90 tablet 0   tadalafil (CIALIS) 20 MG tablet Take 1 tablet (20 mg total) by mouth daily as needed for erectile dysfunction. 30 tablet 3   testosterone cypionate (DEPOTESTOSTERONE CYPIONATE) 200 MG/ML injection Inject 0.93mLs intramuscularly once every 7 days for 2 doses. Then inject 0.4mLs intramuscularly once every 7 days for  2 doses and then stop taking the medication. 2 mL 0   NEEDLE, DISP, 25 G (BD SAFETYGLIDE NEEDLE) 25G X 1" MISC 1 Units by Does not apply route 2 (two) times a week. 100 each 2   TUBERCULIN SYR 1CC/25GX5/8" 25G X 5/8" 1 ML MISC Inject 1 Units into the muscle 2 (two) times a week. (Patient not taking: Reported on 07/19/2021) 100 each 2   No current facility-administered medications for this visit.    Allergies:   Atorvastatin and Crestor [rosuvastatin]    ROS:  Please see the history of present illness.   Otherwise, review of systems are positive for none.   All other systems are reviewed and negative.    PHYSICAL EXAM: VS:  BP 130/78   Pulse 67   Ht 5\' 6"  (1.676 m)   Wt 171 lb (77.6 kg)   BMI 27.60 kg/m  , BMI Body mass index is 27.6 kg/m. GENERAL:  Well appearing NECK:  No jugular venous  distention, waveform within normal limits, carotid upstroke brisk and symmetric, no bruits, no thyromegaly LUNGS:  Clear to auscultation bilaterally CHEST:  Unremarkable HEART:  PMI not displaced or sustained,S1 and S2 within normal limits, no S3, no S4, no clicks, no rubs, no murmurs ABD:  Flat, positive bowel sounds normal in frequency in pitch, no bruits, no rebound, no guarding, no midline pulsatile mass, no hepatomegaly, no splenomegaly EXT:  2 plus pulses throughout, no edema, no cyanosis no clubbing  EKG:  EKG is  ordered today. The ekg ordered to 09/17/2019 demonstrates sinus rhythm, rate 67, axis within normal limits, intervals within normal limits, no acute ST-T wave changes.   Recent Labs: 08/18/2020: ALT 18 12/19/2020: BUN 16; Creat 1.35; Potassium 4.7; Sodium 136    Lipid Panel    Component Value Date/Time   CHOL 219 (H) 02/06/2021 1108   CHOL 207 (H) 03/12/2013 1615   TRIG 119 02/06/2021 1108   TRIG 87 03/12/2013 1615   HDL 54 02/06/2021 1108   HDL 47 03/12/2013 1615   CHOLHDL 4.1 02/06/2021 1108   CHOLHDL 2.7 05/25/2020 1308   VLDL 22 12/26/2018 0825   LDLCALC 144 (H) 02/06/2021 1108   LDLCALC 59 05/25/2020 1308   LDLCALC 143 (H) 03/12/2013 1615      Wt Readings from Last 3 Encounters:  07/19/21 171 lb (77.6 kg)  02/06/21 207 lb 6.4 oz (94.1 kg)  12/28/20 203 lb (92.1 kg)      Other studies Reviewed: Additional studies/ records that were reviewed today include: None. Review of the above records demonstrates:  Please see elsewhere in the note.     ASSESSMENT AND PLAN:    CAD:  The patient has no new sypmtoms.  No further cardiovascular testing is indicated.  We will continue with aggressive risk reduction and meds as listed.  Hypertension :  Is at target.  No change in therapy.   Hyperlipidemia :  LDL was not at target..  The LDL was 144.  I had referred him to our [facility] but unfortunately he is not able to afford any change in therapy.  They were  going to check into some funding and I will see if this is gone anywhere.    Ischemic cardiomyopathy: He had a mildly reduced ejection fraction.  Seems euvolemic.  No change in therapy.  CKD II : Last creatinine was 1.35 which was lower than previous.  CAROTID STENOSIS: This was followed in February with some moderate stenosis and should be  checked again next year.   DIZZINESS: I do not strongly suspect that this is cardiac but given his mildly reduced ejection fraction and the symptoms I would like to screen him with a 2-week event monitor.  Current medicines are reviewed at length with the patient today.  The patient does not have concerns regarding medicines.  The following changes have been made:  None  Labs/ tests ordered today include:   None  Orders Placed This Encounter  Procedures   LONG TERM MONITOR (3-14 DAYS)   EKG 12-Lead      Disposition:   FU with me in one year.    Signed, Rollene Rotunda, MD  07/19/2021 12:55 PM    [facility]

## 2021-07-19 ENCOUNTER — Ambulatory Visit (INDEPENDENT_AMBULATORY_CARE_PROVIDER_SITE_OTHER): Payer: Medicare Other

## 2021-07-19 ENCOUNTER — Ambulatory Visit (INDEPENDENT_AMBULATORY_CARE_PROVIDER_SITE_OTHER): Payer: Medicare Other | Admitting: Cardiology

## 2021-07-19 ENCOUNTER — Encounter: Payer: Self-pay | Admitting: Cardiology

## 2021-07-19 ENCOUNTER — Other Ambulatory Visit: Payer: Self-pay

## 2021-07-19 VITALS — BP 130/78 | HR 67 | Ht 66.0 in | Wt 171.0 lb

## 2021-07-19 DIAGNOSIS — E785 Hyperlipidemia, unspecified: Secondary | ICD-10-CM | POA: Diagnosis not present

## 2021-07-19 DIAGNOSIS — I2510 Atherosclerotic heart disease of native coronary artery without angina pectoris: Secondary | ICD-10-CM

## 2021-07-19 DIAGNOSIS — I6529 Occlusion and stenosis of unspecified carotid artery: Secondary | ICD-10-CM | POA: Diagnosis not present

## 2021-07-19 DIAGNOSIS — R42 Dizziness and giddiness: Secondary | ICD-10-CM

## 2021-07-19 DIAGNOSIS — I10 Essential (primary) hypertension: Secondary | ICD-10-CM | POA: Diagnosis not present

## 2021-07-19 DIAGNOSIS — I255 Ischemic cardiomyopathy: Secondary | ICD-10-CM

## 2021-07-19 DIAGNOSIS — N182 Chronic kidney disease, stage 2 (mild): Secondary | ICD-10-CM

## 2021-07-19 NOTE — Patient Instructions (Signed)
Medication Instructions:  The current medical regimen is effective;  continue present plan and medications.  *If you need a refill on your cardiac medications before your next appointment, please call your pharmacy*  Testing/Procedures: [place] Monitor Instructions  Your physician has requested you wear a ZIO patch monitor for 14 days.  This is a single patch monitor. [employer] supplies one patch monitor per enrollment. Additional stickers are not available. Please do not apply patch if you will be having a Nuclear Stress Test,  Echocardiogram, Cardiac CT, MRI, or Chest Xray during the period you would be wearing the  monitor. The patch cannot be worn during these tests. You cannot remove and re-apply the  ZIO XT patch monitor.  Your ZIO patch monitor will be mailed 3 day USPS to your address on file. It may take 3-5 days  to receive your monitor after you have been enrolled.  Once you have received your monitor, please review the enclosed instructions. Your monitor  has already been registered assigning a specific monitor serial # to you.  Billing and Patient Assistance Program Information  We have supplied [employer] with any of your insurance information on file for billing purposes. [employer] offers a sliding scale Patient Assistance Program for patients that do not have  insurance, or whose insurance does not completely cover the cost of the ZIO monitor.  You must apply for the Patient Assistance Program to qualify for this discounted rate.  To apply, please call [employer] at [phone], select option 4, select option 2, ask to apply for  Patient Assistance Program. Theodore Demark will ask your household income, and how many people  are in your household. They will quote your out-of-pocket cost based on that information.  [employer] will also be able to set up a 24-month interest-free payment plan if needed.  Applying the monitor   Shave hair from upper left chest.  Hold abrader disc  by orange tab. Rub abrader in 40 strokes over the upper left chest as  indicated in your monitor instructions.  Clean area with 4 enclosed alcohol pads. Let dry.  Apply patch as indicated in monitor instructions. Patch will be placed under collarbone on left  side of chest with arrow pointing upward.  Rub patch adhesive wings for 2 minutes. Remove white label marked "1". Remove the white  label marked "2". Rub patch adhesive wings for 2 additional minutes.  While looking in a mirror, press and release button in center of patch. A small green light will  flash 3-4 times. This will be your only indicator that the monitor has been turned on.  Do not shower for the first 24 hours. You may shower after the first 24 hours.  Press the button if you feel a symptom. You will hear a small click. Record Date, Time and  Symptom in the Patient Logbook.  When you are ready to remove the patch, follow instructions on the last 2 pages of Patient  Logbook. Stick patch monitor onto the last page of Patient Logbook.  Place Patient Logbook in the blue and white box. Use locking tab on box and tape box closed  securely. The blue and white box has prepaid postage on it. Please place it in the mailbox as  soon as possible. Your physician should have your test results approximately 7 days after the  monitor has been mailed back to [facility]  Call [facility] [phone] you have questions regarding  your ZIO XT patch  monitor. Call them immediately if you see an orange light blinking on your  monitor.  If your monitor falls off in less than 4 days, contact our Monitor department at [phone].  If your monitor becomes loose or falls off after 4 days call [employer] at [phone] for  suggestions on securing your monitor    Follow-Up: At [facility], you and your health needs are our priority.  As part of our continuing mission to provide you with exceptional heart care, we  have created designated Provider Care Teams.  These Care Teams include your primary Cardiologist (physician) and Advanced Practice Providers (APPs -  Physician Assistants and Nurse Practitioners) who all work together to provide you with the care you need, when you need it.  We recommend signing up for the patient portal called "MyChart".  Sign up information is provided on this After Visit Summary.  MyChart is used to connect with patients for Virtual Visits (Telemedicine).  Patients are able to view lab/test results, encounter notes, upcoming appointments, etc.  Non-urgent messages can be sent to your provider as well.   To learn more about what you can do with MyChart, go to [email or web address].    Your next appointment:   1 year(s)  The format for your next appointment:   In Person  Provider:   Rollene Rotunda, MD    Thank you for choosing [facility]!!

## 2021-07-19 NOTE — Progress Notes (Unsigned)
Enrolled patient for a 14 day Zio XT  monitor to be mailed to patients home  °

## 2021-07-24 DIAGNOSIS — R42 Dizziness and giddiness: Secondary | ICD-10-CM | POA: Diagnosis not present

## 2021-07-26 ENCOUNTER — Telehealth: Payer: Self-pay

## 2021-07-26 DIAGNOSIS — E782 Mixed hyperlipidemia: Secondary | ICD-10-CM

## 2021-07-26 MED ORDER — REPATHA SURECLICK 140 MG/ML ~~LOC~~ SOAJ: 140.0000 mg | SUBCUTANEOUS | 11 refills | Status: DC

## 2021-07-26 NOTE — Telephone Encounter (Signed)
Called and informed the pt they were approved for repatha, rx sent, lipid panel/lft ordered instructed pt to complete fasting labs post 4th dose, and instructed them to call [employer] to apply by phone as I tried to complete online but it said the pt already existed.

## 2021-07-26 NOTE — Telephone Encounter (Signed)
-----   Message from Rosalee Kaufman, RPH-CPP sent at 07/26/2021 10:18 AM EST ----- I saw him in the summer, but he knew wouldn't be able to afford PCSK-9.  Can we do a PA for Repatha and sign him up for [employer]  Thanks! ----- Message ----- From: Rollene Rotunda, MD Sent: 07/19/2021  12:53 PM EST To: Rosalee Kaufman, RPH-CPP  Hi, you are checking with some foundation to see if there is any help with medication costs on this patient.  If you have a here any thing.  Thanks

## 2021-08-02 ENCOUNTER — Telehealth: Payer: Self-pay

## 2021-08-02 MED ORDER — REPATHA SURECLICK 140 MG/ML ~~LOC~~ SOAJ: 140.0000 mg | SUBCUTANEOUS | 11 refills | Status: DC

## 2021-08-02 NOTE — Telephone Encounter (Signed)
Please call to discuss Repatha.  Thank you

## 2021-08-02 NOTE — Telephone Encounter (Signed)
Pt called requesting repatha be switched to [facility]. Rx sent. Instructed pt to call [employer].

## 2021-08-11 DIAGNOSIS — R42 Dizziness and giddiness: Secondary | ICD-10-CM | POA: Diagnosis not present

## 2021-09-04 ENCOUNTER — Telehealth: Payer: Self-pay | Admitting: Pharmacist Clinician (PhC)/ Clinical Pharmacy Specialist

## 2021-09-04 NOTE — Telephone Encounter (Signed)
Patient called to report muscle aches in both legs as well as nausea and GI upset, since starting Repatha.  Would like to discontinue. Last dose was Jan 19.    Advised he not take any further doses and determine if he feels better.  He can reach out to the office once his side effects stop and we can consider another option.

## 2021-09-21 DIAGNOSIS — I25118 Atherosclerotic heart disease of native coronary artery with other forms of angina pectoris: Secondary | ICD-10-CM | POA: Diagnosis not present

## 2021-09-21 DIAGNOSIS — I10 Essential (primary) hypertension: Secondary | ICD-10-CM | POA: Diagnosis not present

## 2021-09-21 DIAGNOSIS — U071 COVID-19: Secondary | ICD-10-CM | POA: Diagnosis not present

## 2021-09-21 DIAGNOSIS — J069 Acute upper respiratory infection, unspecified: Secondary | ICD-10-CM | POA: Diagnosis not present

## 2021-09-22 ENCOUNTER — Ambulatory Visit (HOSPITAL_COMMUNITY): Payer: MEDICAID

## 2021-09-27 DIAGNOSIS — E349 Endocrine disorder, unspecified: Secondary | ICD-10-CM | POA: Diagnosis not present

## 2021-09-27 DIAGNOSIS — I10 Essential (primary) hypertension: Secondary | ICD-10-CM | POA: Diagnosis not present

## 2021-09-27 DIAGNOSIS — R21 Rash and other nonspecific skin eruption: Secondary | ICD-10-CM | POA: Diagnosis not present

## 2021-09-27 DIAGNOSIS — R5383 Other fatigue: Secondary | ICD-10-CM | POA: Diagnosis not present

## 2021-09-27 DIAGNOSIS — Z299 Encounter for prophylactic measures, unspecified: Secondary | ICD-10-CM | POA: Diagnosis not present

## 2021-09-29 ENCOUNTER — Other Ambulatory Visit (HOSPITAL_COMMUNITY): Payer: Self-pay | Admitting: Cardiology

## 2021-09-29 DIAGNOSIS — I6523 Occlusion and stenosis of bilateral carotid arteries: Secondary | ICD-10-CM

## 2021-10-06 ENCOUNTER — Other Ambulatory Visit: Payer: Self-pay

## 2021-10-06 ENCOUNTER — Ambulatory Visit (HOSPITAL_COMMUNITY)
Admission: RE | Admit: 2021-10-06 | Discharge: 2021-10-06 | Disposition: A | Discharge status: Home / Self Care | Payer: Medicare Other | Source: Ambulatory Visit | Attending: Cardiology | Admitting: Cardiology

## 2021-10-06 ENCOUNTER — Other Ambulatory Visit (HOSPITAL_COMMUNITY): Payer: Self-pay | Admitting: Cardiology

## 2021-10-06 DIAGNOSIS — I6523 Occlusion and stenosis of bilateral carotid arteries: Secondary | ICD-10-CM

## 2021-10-11 ENCOUNTER — Encounter: Payer: Self-pay | Admitting: *Deleted

## 2021-12-07 DIAGNOSIS — Z299 Encounter for prophylactic measures, unspecified: Secondary | ICD-10-CM | POA: Diagnosis not present

## 2021-12-07 DIAGNOSIS — E349 Endocrine disorder, unspecified: Secondary | ICD-10-CM | POA: Diagnosis not present

## 2021-12-07 DIAGNOSIS — I10 Essential (primary) hypertension: Secondary | ICD-10-CM | POA: Diagnosis not present

## 2021-12-07 DIAGNOSIS — Z713 Dietary counseling and surveillance: Secondary | ICD-10-CM | POA: Diagnosis not present

## 2021-12-12 DIAGNOSIS — E349 Endocrine disorder, unspecified: Secondary | ICD-10-CM | POA: Diagnosis not present

## 2022-01-03 DIAGNOSIS — E7800 Pure hypercholesterolemia, unspecified: Secondary | ICD-10-CM | POA: Diagnosis not present

## 2022-01-03 DIAGNOSIS — Z299 Encounter for prophylactic measures, unspecified: Secondary | ICD-10-CM | POA: Diagnosis not present

## 2022-01-03 DIAGNOSIS — R5383 Other fatigue: Secondary | ICD-10-CM | POA: Diagnosis not present

## 2022-01-03 DIAGNOSIS — Z7189 Other specified counseling: Secondary | ICD-10-CM | POA: Diagnosis not present

## 2022-01-03 DIAGNOSIS — Z79899 Other long term (current) drug therapy: Secondary | ICD-10-CM | POA: Diagnosis not present

## 2022-01-03 DIAGNOSIS — Z0000 Encounter for general adult medical examination without abnormal findings: Secondary | ICD-10-CM | POA: Diagnosis not present

## 2022-03-19 DIAGNOSIS — I10 Essential (primary) hypertension: Secondary | ICD-10-CM | POA: Diagnosis not present

## 2022-03-19 DIAGNOSIS — I739 Peripheral vascular disease, unspecified: Secondary | ICD-10-CM | POA: Diagnosis not present

## 2022-03-19 DIAGNOSIS — G720 Drug-induced myopathy: Secondary | ICD-10-CM | POA: Diagnosis not present

## 2022-03-19 DIAGNOSIS — M791 Myalgia, unspecified site: Secondary | ICD-10-CM | POA: Diagnosis not present

## 2022-03-19 DIAGNOSIS — Z299 Encounter for prophylactic measures, unspecified: Secondary | ICD-10-CM | POA: Diagnosis not present

## 2022-04-30 DIAGNOSIS — I70211 Atherosclerosis of native arteries of extremities with intermittent claudication, right leg: Secondary | ICD-10-CM | POA: Diagnosis not present

## 2022-05-04 DIAGNOSIS — R5383 Other fatigue: Secondary | ICD-10-CM | POA: Diagnosis not present

## 2022-05-04 DIAGNOSIS — Z299 Encounter for prophylactic measures, unspecified: Secondary | ICD-10-CM | POA: Diagnosis not present

## 2022-05-04 DIAGNOSIS — I10 Essential (primary) hypertension: Secondary | ICD-10-CM | POA: Diagnosis not present

## 2022-05-11 DIAGNOSIS — J069 Acute upper respiratory infection, unspecified: Secondary | ICD-10-CM | POA: Diagnosis not present

## 2022-05-11 DIAGNOSIS — I25118 Atherosclerotic heart disease of native coronary artery with other forms of angina pectoris: Secondary | ICD-10-CM | POA: Diagnosis not present

## 2022-05-11 DIAGNOSIS — Z299 Encounter for prophylactic measures, unspecified: Secondary | ICD-10-CM | POA: Diagnosis not present

## 2022-07-13 DIAGNOSIS — Z299 Encounter for prophylactic measures, unspecified: Secondary | ICD-10-CM | POA: Diagnosis not present

## 2022-07-13 DIAGNOSIS — I10 Essential (primary) hypertension: Secondary | ICD-10-CM | POA: Diagnosis not present

## 2022-07-13 DIAGNOSIS — M25561 Pain in right knee: Secondary | ICD-10-CM | POA: Diagnosis not present

## 2022-08-31 ENCOUNTER — Other Ambulatory Visit (HOSPITAL_COMMUNITY): Payer: Self-pay

## 2022-09-19 DIAGNOSIS — J069 Acute upper respiratory infection, unspecified: Secondary | ICD-10-CM | POA: Diagnosis not present

## 2022-09-19 DIAGNOSIS — R0981 Nasal congestion: Secondary | ICD-10-CM | POA: Diagnosis not present

## 2022-09-19 DIAGNOSIS — R059 Cough, unspecified: Secondary | ICD-10-CM | POA: Diagnosis not present

## 2022-09-19 DIAGNOSIS — Z299 Encounter for prophylactic measures, unspecified: Secondary | ICD-10-CM | POA: Diagnosis not present

## 2022-10-05 ENCOUNTER — Other Ambulatory Visit (HOSPITAL_COMMUNITY): Payer: Self-pay | Admitting: Cardiology

## 2022-10-05 DIAGNOSIS — I6523 Occlusion and stenosis of bilateral carotid arteries: Secondary | ICD-10-CM

## 2022-10-08 ENCOUNTER — Encounter (HOSPITAL_COMMUNITY): Payer: Medicare Other

## 2022-10-11 ENCOUNTER — Encounter: Payer: Self-pay | Admitting: Radiology

## 2022-10-12 ENCOUNTER — Ambulatory Visit (HOSPITAL_COMMUNITY)
Admission: RE | Admit: 2022-10-12 | Discharge: 2022-10-12 | Disposition: A | Discharge status: Home / Self Care | Payer: Medicare Other | Source: Ambulatory Visit | Attending: Internal Medicine | Admitting: Internal Medicine

## 2022-10-12 DIAGNOSIS — I6523 Occlusion and stenosis of bilateral carotid arteries: Secondary | ICD-10-CM

## 2022-11-15 DIAGNOSIS — R42 Dizziness and giddiness: Secondary | ICD-10-CM | POA: Insufficient documentation

## 2022-11-15 NOTE — Progress Notes (Addendum)
  Cardiology Office Note:   Date:  11/16/2022  ID:  Michael Cortez, DOB [birth year], MRN [id number]  History of Present Illness:   Michael Cortez is a 67 y.o. male who was admited in May of 2020 with a NSTEMI.  He had a cath with results listed below.  He had a mild/mod reduced EFof 45 - 50%.  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     He presents for follow up .  He said a couple of weeks ago he had some left sided arm pain.  This was not like his previous angina.  There might have been a little substernal chest discomfort.  This was at rest.  It lasted for few minutes.  He is able to be very active.  He does not bring on the symptoms with activity.  He has a physical job.  The patient denies any new symptoms such as chest discomfort, neck or arm discomfort. There has been no new shortness of breath, PND or orthopnea. There have been no reported palpitations, presyncope or syncope.   ROS: As stated in the HPI and negative for all other systems.  Studies Reviewed:    EKG:  Sinus rhythm, rate 70, axis within normal limits, intervals within normal limits, no acute ST-T wave changes.  Risk Assessment/Calculations:      Physical Exam:   VS:  BP 136/80   Pulse 70   Ht 5\' 6"  (1.676 m)   Wt 175 lb 6.4 oz (79.6 kg)   SpO2 97%   BMI 28.31 kg/m    Wt Readings from Last 3 Encounters:  11/16/22 175 lb 6.4 oz (79.6 kg)  07/19/21 171 lb (77.6 kg)  02/06/21 207 lb 6.4 oz (94.1 kg)     GEN: Well nourished, well developed in no acute distress NECK: No JVD; No carotid bruits CARDIAC: RRR, no murmurs, rubs, gallops RESPIRATORY:  Clear to auscultation without rales, wheezing or rhonchi  ABDOMEN: Soft, non-tender, non-distended EXTREMITIES:  No edema; No deformity   ASSESSMENT AND PLAN:   CAD:  The patient has no new sypmtoms.  No further cardiovascular testing is indicated.  We will continue with aggressive risk reduction and meds as listed.  Hypertension :   BP is at target.   No change in therapy.  Hyperlipidemia : Lipids are followed by his primary provider and I do not have access to these.  We will try to get a report so that I can make sure his LDL is in the 50s.   Ischemic cardiomyopathy:   He had a mildly reduced ejection fraction in the past.  I will check an echocardiogram.  CKD II : Last creatinine was 1.35 but I do not have the most recent labs and again we will try to get these.   CAROTID STENOSIS:   This was mild in March of this year.  No further imaging is indicated.  No change in therapy.  DIZZINESS: He wore a monitor a couple of years ago with some SVT but he is not feeling palpitations and had no dizziness.  No change in therapy.   ADDENDUM: After leaving the office I did get some labs.  The LDL was 126.  This is not at target.  He has been intolerant of multiple meds.  I am going to try to at least start Zetia 10 mg daily.  He has not tolerated PCSK9.  He has not tolerated statins.  Signed, Rollene Rotunda, MD

## 2022-11-16 ENCOUNTER — Ambulatory Visit: Payer: Medicare Other | Attending: Cardiology | Admitting: Cardiology

## 2022-11-16 ENCOUNTER — Encounter: Payer: Self-pay | Admitting: Cardiology

## 2022-11-16 ENCOUNTER — Telehealth: Payer: Self-pay

## 2022-11-16 VITALS — BP 136/80 | HR 70 | Ht 66.0 in | Wt 175.4 lb

## 2022-11-16 DIAGNOSIS — I2510 Atherosclerotic heart disease of native coronary artery without angina pectoris: Secondary | ICD-10-CM

## 2022-11-16 DIAGNOSIS — R42 Dizziness and giddiness: Secondary | ICD-10-CM

## 2022-11-16 DIAGNOSIS — E785 Hyperlipidemia, unspecified: Secondary | ICD-10-CM

## 2022-11-16 DIAGNOSIS — I10 Essential (primary) hypertension: Secondary | ICD-10-CM

## 2022-11-16 DIAGNOSIS — N182 Chronic kidney disease, stage 2 (mild): Secondary | ICD-10-CM | POA: Diagnosis not present

## 2022-11-16 MED ORDER — EZETIMIBE 10 MG PO TABS: 10.0000 mg | ORAL_TABLET | Freq: Every day | ORAL | 3 refills | Status: AC

## 2022-11-16 NOTE — Telephone Encounter (Signed)
Per Dr. Antoine Poche: ADDENDUM: After leaving the office I did get some labs.  The LDL was 126.  This is not at target.  He has been intolerant of multiple meds.  I am going to try to at least start Zetia 10 mg daily.  He has not tolerated PCSK9.  He has not tolerated statins.   Left message for pt to call back for Dr. Jenene Slicker recommendations.

## 2022-11-16 NOTE — Patient Instructions (Signed)
Medication Instructions:  Your physician recommends that you continue on your current medications as directed. Please refer to the Current Medication list given to you today.  *If you need a refill on your cardiac medications before your next appointment, please call your pharmacy*   Testing/Procedures: Your physician has requested that you have an echocardiogram. Echocardiography is a painless test that uses sound waves to create images of your heart. It provides your doctor with information about the size and shape of your heart and how well your heart's chambers and valves are working. This procedure takes approximately one hour. There are no restrictions for this procedure. Please do NOT wear cologne, perfume, aftershave, or lotions (deodorant is allowed). Please arrive 15 minutes prior to your appointment time. This will be done at [facility]- [address] [place], [place]    Follow-Up: At [facility], you and your health needs are our priority.  As part of our continuing mission to provide you with exceptional heart care, we have created designated Provider Care Teams.  These Care Teams include your primary Cardiologist (physician) and Advanced Practice Providers (APPs -  Physician Assistants and Nurse Practitioners) who all work together to provide you with the care you need, when you need it.  We recommend signing up for the patient portal called "MyChart".  Sign up information is provided on this After Visit Summary.  MyChart is used to connect with patients for Virtual Visits (Telemedicine).  Patients are able to view lab/test results, encounter notes, upcoming appointments, etc.  Non-urgent messages can be sent to your provider as well.   To learn more about what you can do with MyChart, go to [email or web address].    Your next appointment:   12 month(s)  Provider:   Rollene Rotunda, MD

## 2022-11-16 NOTE — Telephone Encounter (Signed)
Patient is aware of lab results and provider recommendations. He verbalized understanding. Zetia sent to pharmacy.

## 2022-11-16 NOTE — Addendum Note (Signed)
Addended by: Judene Companion on: 11/16/2022 04:28 PM   Modules accepted: Orders

## 2022-11-16 NOTE — Telephone Encounter (Signed)
Left voicemail for patient to return call to office. 

## 2022-11-16 NOTE — Telephone Encounter (Signed)
Patient is returning call.  °

## 2022-12-21 ENCOUNTER — Ambulatory Visit (HOSPITAL_COMMUNITY)
Admission: RE | Admit: 2022-12-21 | Discharge: 2022-12-21 | Disposition: A | Discharge status: Home / Self Care | Payer: Medicare Other | Source: Ambulatory Visit | Attending: Cardiology | Admitting: Cardiology

## 2022-12-21 DIAGNOSIS — I10 Essential (primary) hypertension: Secondary | ICD-10-CM

## 2022-12-21 DIAGNOSIS — N182 Chronic kidney disease, stage 2 (mild): Secondary | ICD-10-CM | POA: Insufficient documentation

## 2022-12-21 DIAGNOSIS — I2510 Atherosclerotic heart disease of native coronary artery without angina pectoris: Secondary | ICD-10-CM | POA: Diagnosis not present

## 2022-12-21 DIAGNOSIS — E785 Hyperlipidemia, unspecified: Secondary | ICD-10-CM | POA: Diagnosis not present

## 2022-12-21 DIAGNOSIS — R42 Dizziness and giddiness: Secondary | ICD-10-CM | POA: Insufficient documentation

## 2022-12-21 LAB — ECHOCARDIOGRAM COMPLETE
Area-P 1/2: 3.32 cm2
Calc EF: 52.7 %
S' Lateral: 3.7 cm
Single Plane A2C EF: 58.2 %
Single Plane A4C EF: 50.1 %

## 2022-12-21 NOTE — Progress Notes (Signed)
  Echocardiogram 2D Echocardiogram has been performed.  Janalyn Harder 12/21/2022, 10:08 AM

## 2023-01-11 DIAGNOSIS — Z299 Encounter for prophylactic measures, unspecified: Secondary | ICD-10-CM | POA: Diagnosis not present

## 2023-01-11 DIAGNOSIS — Z87891 Personal history of nicotine dependence: Secondary | ICD-10-CM | POA: Diagnosis not present

## 2023-01-11 DIAGNOSIS — T466X5A Adverse effect of antihyperlipidemic and antiarteriosclerotic drugs, initial encounter: Secondary | ICD-10-CM | POA: Diagnosis not present

## 2023-01-11 DIAGNOSIS — I739 Peripheral vascular disease, unspecified: Secondary | ICD-10-CM | POA: Diagnosis not present

## 2023-01-11 DIAGNOSIS — I10 Essential (primary) hypertension: Secondary | ICD-10-CM | POA: Diagnosis not present

## 2023-01-11 DIAGNOSIS — Z7189 Other specified counseling: Secondary | ICD-10-CM | POA: Diagnosis not present

## 2023-01-11 DIAGNOSIS — Z0000 Encounter for general adult medical examination without abnormal findings: Secondary | ICD-10-CM | POA: Diagnosis not present

## 2023-01-11 DIAGNOSIS — G720 Drug-induced myopathy: Secondary | ICD-10-CM | POA: Diagnosis not present

## 2023-01-30 DIAGNOSIS — R413 Other amnesia: Secondary | ICD-10-CM | POA: Diagnosis not present

## 2023-02-07 DIAGNOSIS — Z299 Encounter for prophylactic measures, unspecified: Secondary | ICD-10-CM | POA: Diagnosis not present

## 2023-02-07 DIAGNOSIS — G3184 Mild cognitive impairment, so stated: Secondary | ICD-10-CM | POA: Diagnosis not present

## 2023-02-07 DIAGNOSIS — I25118 Atherosclerotic heart disease of native coronary artery with other forms of angina pectoris: Secondary | ICD-10-CM | POA: Diagnosis not present

## 2023-02-07 DIAGNOSIS — I10 Essential (primary) hypertension: Secondary | ICD-10-CM | POA: Diagnosis not present

## 2023-02-19 DIAGNOSIS — I10 Essential (primary) hypertension: Secondary | ICD-10-CM | POA: Diagnosis not present

## 2023-02-19 DIAGNOSIS — Z299 Encounter for prophylactic measures, unspecified: Secondary | ICD-10-CM | POA: Diagnosis not present

## 2023-02-19 DIAGNOSIS — R42 Dizziness and giddiness: Secondary | ICD-10-CM | POA: Diagnosis not present

## 2023-03-22 DIAGNOSIS — Z0000 Encounter for general adult medical examination without abnormal findings: Secondary | ICD-10-CM | POA: Diagnosis not present

## 2023-03-22 DIAGNOSIS — E7800 Pure hypercholesterolemia, unspecified: Secondary | ICD-10-CM | POA: Diagnosis not present

## 2023-03-22 DIAGNOSIS — Z79899 Other long term (current) drug therapy: Secondary | ICD-10-CM | POA: Diagnosis not present

## 2023-03-22 DIAGNOSIS — Z299 Encounter for prophylactic measures, unspecified: Secondary | ICD-10-CM | POA: Diagnosis not present

## 2023-03-22 DIAGNOSIS — R5383 Other fatigue: Secondary | ICD-10-CM | POA: Diagnosis not present

## 2023-03-22 DIAGNOSIS — I10 Essential (primary) hypertension: Secondary | ICD-10-CM | POA: Diagnosis not present

## 2023-03-22 DIAGNOSIS — Z87891 Personal history of nicotine dependence: Secondary | ICD-10-CM | POA: Diagnosis not present

## 2023-04-12 DIAGNOSIS — E7800 Pure hypercholesterolemia, unspecified: Secondary | ICD-10-CM | POA: Diagnosis not present

## 2023-04-12 DIAGNOSIS — R5383 Other fatigue: Secondary | ICD-10-CM | POA: Diagnosis not present

## 2023-04-12 DIAGNOSIS — R413 Other amnesia: Secondary | ICD-10-CM | POA: Diagnosis not present

## 2023-04-12 DIAGNOSIS — Z79899 Other long term (current) drug therapy: Secondary | ICD-10-CM | POA: Diagnosis not present

## 2023-05-27 DIAGNOSIS — R0982 Postnasal drip: Secondary | ICD-10-CM | POA: Diagnosis not present

## 2023-05-27 DIAGNOSIS — Z299 Encounter for prophylactic measures, unspecified: Secondary | ICD-10-CM | POA: Diagnosis not present

## 2023-05-27 DIAGNOSIS — J069 Acute upper respiratory infection, unspecified: Secondary | ICD-10-CM | POA: Diagnosis not present

## 2023-06-12 ENCOUNTER — Telehealth: Payer: Self-pay | Admitting: Cardiology

## 2023-06-12 NOTE — Telephone Encounter (Signed)
Left voicemail to return call to office.

## 2023-06-12 NOTE — Telephone Encounter (Signed)
Follow Up:     Patient is returning a call from today. 

## 2023-06-12 NOTE — Telephone Encounter (Signed)
Spoke with patient and advised to contact PCP regarding neck pain.

## 2023-06-12 NOTE — Telephone Encounter (Signed)
Pt states his neck has been bothering him and is now causing him pain in his head. Please advise

## 2023-06-14 DIAGNOSIS — M542 Cervicalgia: Secondary | ICD-10-CM | POA: Diagnosis not present

## 2023-06-14 DIAGNOSIS — I10 Essential (primary) hypertension: Secondary | ICD-10-CM | POA: Diagnosis not present

## 2023-06-14 DIAGNOSIS — M47812 Spondylosis without myelopathy or radiculopathy, cervical region: Secondary | ICD-10-CM | POA: Diagnosis not present

## 2023-06-14 DIAGNOSIS — Z299 Encounter for prophylactic measures, unspecified: Secondary | ICD-10-CM | POA: Diagnosis not present

## 2023-07-15 DIAGNOSIS — I10 Essential (primary) hypertension: Secondary | ICD-10-CM | POA: Diagnosis not present

## 2023-07-15 DIAGNOSIS — Z299 Encounter for prophylactic measures, unspecified: Secondary | ICD-10-CM | POA: Diagnosis not present

## 2023-07-15 DIAGNOSIS — R059 Cough, unspecified: Secondary | ICD-10-CM | POA: Diagnosis not present

## 2023-07-15 DIAGNOSIS — R42 Dizziness and giddiness: Secondary | ICD-10-CM | POA: Diagnosis not present

## 2023-08-30 DIAGNOSIS — M778 Other enthesopathies, not elsewhere classified: Secondary | ICD-10-CM | POA: Diagnosis not present

## 2023-08-30 DIAGNOSIS — D692 Other nonthrombocytopenic purpura: Secondary | ICD-10-CM | POA: Diagnosis not present

## 2023-08-30 DIAGNOSIS — I10 Essential (primary) hypertension: Secondary | ICD-10-CM | POA: Diagnosis not present

## 2023-08-30 DIAGNOSIS — I739 Peripheral vascular disease, unspecified: Secondary | ICD-10-CM | POA: Diagnosis not present

## 2023-08-30 DIAGNOSIS — Z299 Encounter for prophylactic measures, unspecified: Secondary | ICD-10-CM | POA: Diagnosis not present

## 2023-08-30 DIAGNOSIS — I25118 Atherosclerotic heart disease of native coronary artery with other forms of angina pectoris: Secondary | ICD-10-CM | POA: Diagnosis not present

## 2023-09-03 DIAGNOSIS — I10 Essential (primary) hypertension: Secondary | ICD-10-CM | POA: Diagnosis not present

## 2023-09-03 DIAGNOSIS — I25118 Atherosclerotic heart disease of native coronary artery with other forms of angina pectoris: Secondary | ICD-10-CM | POA: Diagnosis not present

## 2023-09-03 DIAGNOSIS — I739 Peripheral vascular disease, unspecified: Secondary | ICD-10-CM | POA: Diagnosis not present

## 2023-09-03 DIAGNOSIS — Z299 Encounter for prophylactic measures, unspecified: Secondary | ICD-10-CM | POA: Diagnosis not present

## 2023-09-03 DIAGNOSIS — S62002A Unspecified fracture of navicular [scaphoid] bone of left wrist, initial encounter for closed fracture: Secondary | ICD-10-CM | POA: Diagnosis not present

## 2023-09-04 ENCOUNTER — Ambulatory Visit: Payer: Medicare Other | Admitting: Orthopaedic Surgery

## 2023-09-04 ENCOUNTER — Encounter: Payer: Self-pay | Admitting: Orthopaedic Surgery

## 2023-09-04 VITALS — Ht 68.0 in | Wt 188.0 lb

## 2023-09-04 DIAGNOSIS — M7712 Lateral epicondylitis, left elbow: Secondary | ICD-10-CM | POA: Diagnosis not present

## 2023-09-04 NOTE — Progress Notes (Unsigned)
Office Visit Note   Patient: Michael Cortez           Date of Birth: Apr 26, 1956           MRN: [id number] Visit Date: 09/04/2023              Requested by: Ignatius Specking, MD [address] [place],  [place] [address] PCP: Ignatius Specking, MD   Assessment & Plan: Visit Diagnoses: No diagnosis found.  Plan: ***  Follow-Up Instructions: No follow-ups on file.   Orders:  No orders of the defined types were placed in this encounter.  No orders of the defined types were placed in this encounter.     Procedures: No procedures performed   Clinical Data: No additional findings.   Subjective: Chief Complaint  Patient presents with   Left Elbow - Pain   Left Wrist - Pain    HPI  Review of Systems   Objective: Vital Signs: Ht 5\' 8"  (1.727 m)   Wt 188 lb (85.3 kg)   BMI 28.59 kg/m   Physical Exam  Ortho Exam  Specialty Comments:  No specialty comments available.  Imaging: No results found.   PMFS History: Patient Active Problem List   Diagnosis Date Noted   Dizziness 11/15/2022   Stenosis of carotid artery 07/17/2021   Ischemic cardiomyopathy 06/07/2020   Bilateral carotid artery stenosis 06/07/2020   Coronary artery disease involving native coronary artery of native heart without angina pectoris 09/15/2019   Chronic systolic heart failure (HCC) 09/15/2019   CKD (chronic kidney disease), stage II 09/15/2019   Educated about COVID-19 virus infection 09/15/2019   Erectile dysfunction 05/11/2019   Vitamin D deficiency disease 05/11/2019   Dyslipidemia 12/31/2018   Renal insufficiency 12/27/2018   NSTEMI (non-ST elevated myocardial infarction) (HCC) 12/26/2018   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  2005- Cardiac cath:20-40% lesions in the LAD, circumflex and RCA with normal EF   CVA (cerebral vascular accident) (HCC)    Degenerative joint disease    Erectile dysfunction 05/11/2019   Hyperlipidemia    Hypertension    Tobacco abuse    40 pack year total consumption   Vitamin D deficiency disease 05/11/2019    Family History  Problem Relation Age of Onset   Coronary artery disease Father 72       Fatal myocardial infarction   Cancer Father    Cancer Brother    COPD Mother    Cancer Mother     Past Surgical History:  Procedure Laterality Date   APPENDECTOMY     CORONARY STENT INTERVENTION N/A 12/26/2018   Procedure: CORONARY STENT INTERVENTION;  Surgeon: Lennette Bihari, MD;  Location: [facility];  Service: Cardiovascular;  Laterality: N/A;   LEFT HEART CATH AND CORONARY ANGIOGRAPHY N/A 12/26/2018   Procedure: LEFT HEART CATH AND CORONARY ANGIOGRAPHY;  Surgeon: Lennette Bihari, MD;  Location: [facility];  Service: Cardiovascular;  Laterality: N/A;   Social History   Occupational History   Not on file  Tobacco Use   Smoking status: Former  Current packs/day: 1.00    Average packs/day: 1 pack/day for 40.0 years (40.0 ttl pk-yrs)    Types: Cigarettes   Smokeless tobacco: Never  Substance and Sexual Activity   Alcohol use: No   Drug use: No   Sexual activity: Not on file

## 2023-09-05 DIAGNOSIS — M7712 Lateral epicondylitis, left elbow: Secondary | ICD-10-CM | POA: Diagnosis not present

## 2023-09-05 MED ORDER — LIDOCAINE HCL 1 % IJ SOLN
0.5000 mL | INTRAMUSCULAR | Status: AC | PRN
  Administered 2023-09-05: .5 mL

## 2023-09-05 MED ORDER — BUPIVACAINE HCL 0.5 % IJ SOLN
1.0000 mL | INTRAMUSCULAR | Status: AC | PRN
  Administered 2023-09-05: 1 mL via INTRA_ARTICULAR

## 2023-09-05 MED ORDER — METHYLPREDNISOLONE ACETATE 40 MG/ML IJ SUSP
40.0000 mg | INTRAMUSCULAR | Status: AC | PRN
  Administered 2023-09-05: 40 mg via INTRA_ARTICULAR

## 2023-09-16 ENCOUNTER — Other Ambulatory Visit: Payer: Self-pay | Admitting: *Deleted

## 2023-09-16 DIAGNOSIS — I6523 Occlusion and stenosis of bilateral carotid arteries: Secondary | ICD-10-CM

## 2023-09-20 DIAGNOSIS — Z299 Encounter for prophylactic measures, unspecified: Secondary | ICD-10-CM | POA: Diagnosis not present

## 2023-09-20 DIAGNOSIS — M542 Cervicalgia: Secondary | ICD-10-CM | POA: Diagnosis not present

## 2023-09-20 DIAGNOSIS — I10 Essential (primary) hypertension: Secondary | ICD-10-CM | POA: Diagnosis not present

## 2023-09-20 DIAGNOSIS — I25118 Atherosclerotic heart disease of native coronary artery with other forms of angina pectoris: Secondary | ICD-10-CM | POA: Diagnosis not present

## 2023-10-08 ENCOUNTER — Encounter (HOSPITAL_COMMUNITY): Payer: Medicare Other

## 2023-10-09 DIAGNOSIS — Z026 Encounter for examination for insurance purposes: Secondary | ICD-10-CM | POA: Diagnosis not present

## 2023-10-10 DIAGNOSIS — R0981 Nasal congestion: Secondary | ICD-10-CM | POA: Diagnosis not present

## 2023-10-10 DIAGNOSIS — Z299 Encounter for prophylactic measures, unspecified: Secondary | ICD-10-CM | POA: Diagnosis not present

## 2023-10-10 DIAGNOSIS — I25118 Atherosclerotic heart disease of native coronary artery with other forms of angina pectoris: Secondary | ICD-10-CM | POA: Diagnosis not present

## 2023-10-10 DIAGNOSIS — J069 Acute upper respiratory infection, unspecified: Secondary | ICD-10-CM | POA: Diagnosis not present

## 2023-10-18 ENCOUNTER — Encounter: Payer: Self-pay | Admitting: Orthopaedic Surgery

## 2023-10-18 ENCOUNTER — Ambulatory Visit (HOSPITAL_COMMUNITY)
Admission: RE | Admit: 2023-10-18 | Discharge: 2023-10-18 | Disposition: A | Discharge status: Home / Self Care | Payer: Medicare Other | Source: Ambulatory Visit | Attending: Cardiology | Admitting: Cardiology

## 2023-10-18 ENCOUNTER — Ambulatory Visit: Payer: Medicare Other | Admitting: Orthopaedic Surgery

## 2023-10-18 VITALS — BP 142/75 | HR 79 | Ht 68.0 in | Wt 188.0 lb

## 2023-10-18 DIAGNOSIS — M7712 Lateral epicondylitis, left elbow: Secondary | ICD-10-CM | POA: Diagnosis not present

## 2023-10-18 DIAGNOSIS — I6523 Occlusion and stenosis of bilateral carotid arteries: Secondary | ICD-10-CM | POA: Insufficient documentation

## 2023-10-18 NOTE — Progress Notes (Signed)
 Office Visit Note   Patient: Michael Cortez           Date of Birth: Jun 06, 1956           MRN: [id number] Visit Date: 10/18/2023              Requested by: Ignatius Specking, MD [address] [place],  [place] [address] PCP: Ignatius Specking, MD   Assessment & Plan: Visit Diagnoses:  1. Left tennis elbow     Plan: Discussed with patient that I think his symptoms should resolve with time he still having problems in couple months he can return with placed on restrictions by the previous doctor and sees him again on Wednesday patient had be able to increase him from 1 pound to more weight for a period of 1 to 2 weeks and then he should be able to resume regular work activity I would expect.  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  Follow-Up Instructions: No follow-ups on file.   Orders:  No orders of the defined types were placed in this encounter.  No orders of the defined types were placed in this encounter.     Procedures: No procedures performed   Clinical Data: No additional findings.   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  I had seen him 09/05/2023 with tennis elbow injection with good relief.  He has been wearing a tennis elbow brace.  He states since his injury 2 weeks ago 2/24 he has noticed some improvement.  He has been restricted from lifting more than 1 pound with the left arm by the first physician he saw after his on-the-job injury.  He denies neck pain associated hand numbness.  Review of Systems all other systems noncontributory to HPI.   Objective: Vital Signs: BP (!) 142/75 (BP Location: Right Arm)   Pulse 79   Ht 5\' 8"  (1.727 m)   Wt 188 lb (85.3 kg)   BMI 28.59 kg/m    Physical Exam Constitutional:      Appearance: He is well-developed.  HENT:     Head: Normocephalic and atraumatic.     Right Ear: External ear normal.     Left Ear: External ear normal.  Eyes:     Pupils: Pupils are equal, round, and reactive to light.  Neck:     Thyroid: No thyromegaly.     Trachea: No tracheal deviation.  Cardiovascular:     Rate and Rhythm: Normal rate.  Pulmonary:     Effort: Pulmonary effort is normal.     Breath sounds: No wheezing.  Abdominal:     General: Bowel sounds are normal.     Palpations: Abdomen is soft.  Musculoskeletal:     Cervical back: Neck supple.  Skin:    General: Skin is warm and dry.     Capillary Refill: Capillary refill takes less than 2 seconds.  Neurological:     Mental Status: He is alert and oriented to person, place, and time.  Psychiatric:        Behavior: Behavior normal.        Thought Content: Thought content normal.  Judgment: Judgment normal.     Ortho Exam no significant pain palpation lateral epicondyle no palpable defect.  Arm arm tennis elbow braces adjusted.  Good wrist extension with resistance.  Elbow collateral ligaments are intact.  No elbow effusion.  Specialty Comments:  No specialty comments available.  Imaging: VAS US CAROTID Result Date: 10/18/2023 Carotid Arterial Duplex Study Patient Name:  Michael Cortez  Date of Exam:   10/18/2023 Medical Rec #: [id number]          Accession #:    [id number] Date of Birth: [birth year]          Patient Gender: M Patient Age:   43 years Exam Location:  [facility] Procedure:      VAS US CAROTID Referring Phys: Arlys John CRENSHAW --------------------------------------------------------------------------------  Indications:       Carotid artery disease and patient denies any cerebrovascular                    symptoms. Risk Factors:      Hypertension, hyperlipidemia, past history of smoking, prior                    MI, coronary artery disease, prior CVA. Comparison  Study:  In 10/2022, a carotid duplex showed velocities of 70/18 cm/s                    in the RICA and 120/32 cm/s in the LICA. Performing Technologist: Tyna Jaksch RVT  Examination Guidelines: A complete evaluation includes B-mode imaging, spectral Doppler, color Doppler, and power Doppler as needed of all accessible portions of each vessel. Bilateral testing is considered an integral part of a complete examination. Limited examinations for reoccurring indications may be performed as noted.  Right Carotid Findings: +----------+--------+--------+--------+------------------+--------+           PSV cm/sEDV cm/sStenosisPlaque DescriptionComments +----------+--------+--------+--------+------------------+--------+ CCA Prox  101     23                                         +----------+--------+--------+--------+------------------+--------+ CCA Distal66      17                                         +----------+--------+--------+--------+------------------+--------+ ICA Prox  85      26      1-39%   heterogenous               +----------+--------+--------+--------+------------------+--------+ ICA Distal91      34                                         +----------+--------+--------+--------+------------------+--------+ ECA       156     19              heterogenous               +----------+--------+--------+--------+------------------+--------+ +----------+--------+-------+----------------+-------------------+           PSV cm/sEDV cmsDescribe        Arm Pressure (mmHG) +----------+--------+-------+----------------+-------------------+ [id number]            Multiphasic, [id number]                 +----------+--------+-------+----------------+-------------------+ +---------+--------+--+--------+-+--------------+  VertebralPSV cm/s62EDV cm/s0High resistant +---------+--------+--+--------+-+--------------+  Left Carotid Findings:  +----------+--------+--------+--------+---------------------+------------------+           PSV cm/sEDV cm/sStenosisPlaque Description   Comments           +----------+--------+--------+--------+---------------------+------------------+ CCA Prox  115     26                                                      +----------+--------+--------+--------+---------------------+------------------+ CCA Distal74      19                                                      +----------+--------+--------+--------+---------------------+------------------+ ICA Prox  143     34      40-59%  heterogenous and     stenosis based on                                    irregular            peak systolic                                                             velocities and                                                            plaque formation   +----------+--------+--------+--------+---------------------+------------------+ ICA Distal69      24                                                      +----------+--------+--------+--------+---------------------+------------------+ ECA       283     57      >50%    heterogenous                            +----------+--------+--------+--------+---------------------+------------------+ +----------+--------+--------+----------------+-------------------+           PSV cm/sEDV cm/sDescribe        Arm Pressure (mmHG) +----------+--------+--------+----------------+-------------------+ [id number]             Multiphasic, [id number]                 +----------+--------+--------+----------------+-------------------+ +---------+--------+--+--------+--+---------+ VertebralPSV cm/s59EDV cm/s18Antegrade +---------+--------+--+--------+--+---------+   Summary: Right Carotid: Velocities in the right ICA are consistent with a 1-39% stenosis. Left Carotid: Velocities in the left ICA are consistent with a 40-59% stenosis.                LICA stenosis based on peak systolic velocities and plaque  formation. Vertebrals:  Left vertebral artery demonstrates antegrade flow. Right vertebral              artery demonstrates high resistant flow. Subclavians: Normal flow hemodynamics were seen in bilateral subclavian              arteries. *See table(s) above for measurements and observations. Suggest follow up study in 12 months. Electronically signed by Nanetta Batty MD on 10/18/2023 at 1:18:35 PM.    Final      PMFS History: Patient Active Problem List   Diagnosis Date Noted   Left tennis elbow 09/05/2023   Dizziness 11/15/2022   Stenosis of carotid artery 07/17/2021   Ischemic cardiomyopathy 06/07/2020   Bilateral carotid artery stenosis 06/07/2020   Coronary artery disease involving native coronary artery of native heart without angina pectoris 09/15/2019   Chronic systolic heart failure (HCC) 09/15/2019   CKD (chronic kidney disease), stage II 09/15/2019   Educated about COVID-19 virus infection 09/15/2019   Erectile dysfunction 05/11/2019   Vitamin D deficiency disease 05/11/2019   Dyslipidemia 12/31/2018   Renal insufficiency 12/27/2018   NSTEMI (non-ST elevated myocardial infarction) (HCC) 12/26/2018   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  2005- Cardiac cath:20-40% lesions in the LAD, circumflex and RCA with normal EF   CVA (cerebral vascular accident) (HCC)    Degenerative joint disease    Erectile dysfunction 05/11/2019   Hyperlipidemia    Hypertension    Tobacco abuse    40 pack year total consumption   Vitamin D  deficiency disease 05/11/2019    Family History  Problem Relation Age of Onset   Coronary artery disease Father 55       Fatal myocardial infarction   Cancer Father    Cancer Brother    COPD Mother    Cancer Mother     Past Surgical History:  Procedure Laterality Date   APPENDECTOMY     CORONARY STENT INTERVENTION N/A 12/26/2018   Procedure: CORONARY STENT INTERVENTION;  Surgeon: Lennette Bihari, MD;  Location: [facility] INVASIVE CV LAB;  Service: Cardiovascular;  Laterality: N/A;   LEFT HEART CATH AND CORONARY ANGIOGRAPHY N/A 12/26/2018   Procedure: LEFT HEART CATH AND CORONARY ANGIOGRAPHY;  Surgeon: Lennette Bihari, MD;  Location: [facility] INVASIVE CV LAB;  Service: Cardiovascular;  Laterality: N/A;   Social History   Occupational History   Not on file  Tobacco Use   Smoking status: Former    Current packs/day: 1.00    Average packs/day: 1 pack/day for 40.0 years (40.0 ttl pk-yrs)    Types: Cigarettes   Smokeless tobacco: Never  Substance and Sexual Activity   Alcohol use: No   Drug use: No   Sexual activity: Not on file

## 2023-10-21 ENCOUNTER — Encounter: Payer: Self-pay | Admitting: *Deleted

## 2023-10-23 DIAGNOSIS — Z026 Encounter for examination for insurance purposes: Secondary | ICD-10-CM | POA: Diagnosis not present

## 2023-10-24 ENCOUNTER — Other Ambulatory Visit: Payer: Self-pay | Admitting: *Deleted

## 2023-10-24 DIAGNOSIS — I6523 Occlusion and stenosis of bilateral carotid arteries: Secondary | ICD-10-CM

## 2023-10-28 DIAGNOSIS — I10 Essential (primary) hypertension: Secondary | ICD-10-CM | POA: Diagnosis not present

## 2023-10-28 DIAGNOSIS — Z299 Encounter for prophylactic measures, unspecified: Secondary | ICD-10-CM | POA: Diagnosis not present

## 2023-10-28 DIAGNOSIS — M542 Cervicalgia: Secondary | ICD-10-CM | POA: Diagnosis not present

## 2023-11-04 DIAGNOSIS — I10 Essential (primary) hypertension: Secondary | ICD-10-CM | POA: Diagnosis not present

## 2023-11-04 DIAGNOSIS — Z299 Encounter for prophylactic measures, unspecified: Secondary | ICD-10-CM | POA: Diagnosis not present

## 2023-11-04 DIAGNOSIS — I25118 Atherosclerotic heart disease of native coronary artery with other forms of angina pectoris: Secondary | ICD-10-CM | POA: Diagnosis not present

## 2023-11-04 DIAGNOSIS — M542 Cervicalgia: Secondary | ICD-10-CM | POA: Diagnosis not present

## 2023-11-04 DIAGNOSIS — R52 Pain, unspecified: Secondary | ICD-10-CM | POA: Diagnosis not present

## 2023-11-14 NOTE — Progress Notes (Unsigned)
 Cardiology Office Note:   Date:  11/15/2023  ID:  Michael Cortez, DOB [birth year], MRN [id number] PCP: Ignatius Specking, MD  [facility] Providers Cardiologist:  Rollene Rotunda, MD {  History of Present Illness:   Michael Cortez is a 68 y.o. male  who was admited in May of 2020 with a NSTEMI.  He had a cath with results listed below.  He had a mild/mod reduced EFof 45 - 50%.  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      He presents for follow up .  His last ejection fraction in 2024 was 50 to 55%.  There were no significant valvular abnormalities.  Since I last saw him he is doing relatively well.  He has a very physical job.  He does not get any chest pressure, neck or arm discomfort.  He does not have any shortness of breath, PND or orthopnea.  He has had no weight gain or edema.    ROS: Headaches.  As stated in the HPI and negative for all other systems.  Studies Reviewed:    EKG:   EKG Interpretation Date/Time:  Friday November 15 2023 10:29:37 EDT Ventricular Rate:  64 PR Interval:  172 QRS Duration:  104 QT Interval:  376 QTC Calculation: 387 R Axis:   68  Text Interpretation: Normal sinus rhythm When compared with ECG of 27-Dec-2018 04:36, No significant change since last tracing Confirmed by Rollene Rotunda ([id number]) on 11/15/2023 10:35:10 AM    Risk Assessment/Calculations:              Physical Exam:   VS:  BP 126/70 (BP Location: Left Arm, Patient Position: Sitting, Cuff Size: Normal)   Pulse 66   Ht 5\' 8"  (1.727 m)   Wt 184 lb 12.8 oz (83.8 kg)   SpO2 97%   BMI 28.10 kg/m    Wt Readings from Last 3 Encounters:  11/15/23 184 lb 12.8 oz (83.8 kg)  10/18/23 188 lb (85.3 kg)  09/04/23 188 lb (85.3 kg)     GEN: Well nourished, well developed in no acute distress NECK: No JVD; No carotid bruits CARDIAC: RRR, no murmurs, rubs, gallops RESPIRATORY:  Clear to auscultation without rales, wheezing or rhonchi  ABDOMEN: Soft, non-tender,  non-distended EXTREMITIES:  No edema; No deformity   ASSESSMENT AND PLAN:   CAD:  The patient has no new sypmtoms.  No further cardiovascular testing is indicated.  We will continue with aggressive risk reduction and meds as listed.  Hypertension :   BP is at target.  No change in therapy.   Hyperlipidemia : His last LDL was 126.  This was not at target.  I started Zetia at the last visit.  He is currently being trialed on 5 mg of Crestor once a week and he seems to be tolerating this.  His goal for therapy is an LDL of 55 and I would also ask his primary provider when labs are drawn upcoming to get an LP(a).    Ischemic cardiomyopathy: EF was low normal in 2024.  No change in therapy.  CKD II :   This is followed by his primary provider.  CAROTID STENOSIS:    This was mild in March of this 2024.  No further imaging is indicated.  No change in therapy.   DIZZINESS: This has been improved.  He is not as bothered by this.  No further workup.      Follow up with me  in about 18 months.  Signed, Rollene Rotunda, MD

## 2023-11-15 ENCOUNTER — Encounter: Payer: Self-pay | Admitting: Cardiology

## 2023-11-15 ENCOUNTER — Ambulatory Visit: Payer: Medicare Other | Attending: Cardiology | Admitting: Cardiology

## 2023-11-15 VITALS — BP 126/70 | HR 66 | Ht 68.0 in | Wt 184.8 lb

## 2023-11-15 DIAGNOSIS — I2510 Atherosclerotic heart disease of native coronary artery without angina pectoris: Secondary | ICD-10-CM | POA: Diagnosis not present

## 2023-11-15 DIAGNOSIS — I10 Essential (primary) hypertension: Secondary | ICD-10-CM

## 2023-11-15 DIAGNOSIS — E785 Hyperlipidemia, unspecified: Secondary | ICD-10-CM | POA: Diagnosis not present

## 2023-11-15 DIAGNOSIS — N182 Chronic kidney disease, stage 2 (mild): Secondary | ICD-10-CM

## 2023-11-15 NOTE — Patient Instructions (Signed)
 Medication Instructions:  No changes. *If you need a refill on your cardiac medications before your next appointment, please call your pharmacy*  Lab Work: Goal of LDL is 55, Please ask PCP to draw a Lpa with your next blood draw there.  If you have labs (blood work) drawn today and your tests are completely normal, you will receive your results only by: MyChart Message (if you have MyChart) OR A paper copy in the mail If you have any lab test that is abnormal or we need to change your treatment, we will call you to review the results. Follow-Up: At [facility], you and your health needs are our priority.  As part of our continuing mission to provide you with exceptional heart care, our providers are all part of one team.  This team includes your primary Cardiologist (physician) and Advanced Practice Providers or APPs (Physician Assistants and Nurse Practitioners) who all work together to provide you with the care you need, when you need it.  Your next appointment:   18 month(s) In [place].  Provider:   Rollene Rotunda, MD    We recommend signing up for the patient portal called "MyChart".  Sign up information is provided on this After Visit Summary.  MyChart is used to connect with patients for Virtual Visits (Telemedicine).  Patients are able to view lab/test results, encounter notes, upcoming appointments, etc.  Non-urgent messages can be sent to your provider as well.   To learn more about what you can do with MyChart, go to [email or web address].   Other Instructions       1st Floor: - Lobby - Registration  - Pharmacy  - Lab - Cafe  2nd Floor: - PV Lab - Diagnostic Testing (echo, CT, nuclear med)  3rd Floor: - Vacant  4th Floor: - TCTS (cardiothoracic surgery) - AFib Clinic - Structural Heart Clinic - Vascular Surgery  - Vascular Ultrasound  5th Floor: - [facility]Care Cardiology (general and EP) - Clinical Pharmacy for coumadin, hypertension, lipid,  weight-loss medications, and med management appointments    Valet parking services will be available as well.

## 2024-03-04 DIAGNOSIS — Z87891 Personal history of nicotine dependence: Secondary | ICD-10-CM | POA: Diagnosis not present

## 2024-03-04 DIAGNOSIS — Z7189 Other specified counseling: Secondary | ICD-10-CM | POA: Diagnosis not present

## 2024-03-04 DIAGNOSIS — Z1389 Encounter for screening for other disorder: Secondary | ICD-10-CM | POA: Diagnosis not present

## 2024-03-04 DIAGNOSIS — Z0000 Encounter for general adult medical examination without abnormal findings: Secondary | ICD-10-CM | POA: Diagnosis not present

## 2024-03-04 DIAGNOSIS — R52 Pain, unspecified: Secondary | ICD-10-CM | POA: Diagnosis not present

## 2024-03-04 DIAGNOSIS — I10 Essential (primary) hypertension: Secondary | ICD-10-CM | POA: Diagnosis not present

## 2024-03-04 DIAGNOSIS — R5383 Other fatigue: Secondary | ICD-10-CM | POA: Diagnosis not present

## 2024-03-04 DIAGNOSIS — Z299 Encounter for prophylactic measures, unspecified: Secondary | ICD-10-CM | POA: Diagnosis not present

## 2024-03-23 ENCOUNTER — Emergency Department (HOSPITAL_COMMUNITY)
Admission: EM | Admit: 2024-03-23 | Discharge: 2024-03-23 | Disposition: A | Discharge status: Home / Self Care | Attending: Emergency Medicine | Admitting: Emergency Medicine

## 2024-03-23 ENCOUNTER — Emergency Department (HOSPITAL_COMMUNITY)

## 2024-03-23 ENCOUNTER — Encounter (HOSPITAL_COMMUNITY): Payer: Self-pay

## 2024-03-23 DIAGNOSIS — N179 Acute kidney failure, unspecified: Secondary | ICD-10-CM | POA: Diagnosis not present

## 2024-03-23 DIAGNOSIS — R0789 Other chest pain: Secondary | ICD-10-CM | POA: Diagnosis not present

## 2024-03-23 DIAGNOSIS — Z79899 Other long term (current) drug therapy: Secondary | ICD-10-CM | POA: Insufficient documentation

## 2024-03-23 DIAGNOSIS — F17200 Nicotine dependence, unspecified, uncomplicated: Secondary | ICD-10-CM | POA: Insufficient documentation

## 2024-03-23 DIAGNOSIS — R079 Chest pain, unspecified: Secondary | ICD-10-CM | POA: Diagnosis not present

## 2024-03-23 DIAGNOSIS — I2510 Atherosclerotic heart disease of native coronary artery without angina pectoris: Secondary | ICD-10-CM | POA: Insufficient documentation

## 2024-03-23 DIAGNOSIS — E860 Dehydration: Secondary | ICD-10-CM | POA: Insufficient documentation

## 2024-03-23 DIAGNOSIS — R55 Syncope and collapse: Secondary | ICD-10-CM | POA: Insufficient documentation

## 2024-03-23 DIAGNOSIS — I10 Essential (primary) hypertension: Secondary | ICD-10-CM | POA: Diagnosis not present

## 2024-03-23 LAB — COMPREHENSIVE METABOLIC PANEL WITH GFR
ALT: 13 U/L (ref 0–44)
AST: 19 U/L (ref 15–41)
Albumin: 3.8 g/dL (ref 3.5–5.0)
Alkaline Phosphatase: 55 U/L (ref 38–126)
Anion gap: 10 (ref 5–15)
BUN: 22 mg/dL (ref 8–23)
CO2: 21 mmol/L — ABNORMAL LOW (ref 22–32)
Calcium: 8.8 mg/dL — ABNORMAL LOW (ref 8.9–10.3)
Chloride: 102 mmol/L (ref 98–111)
Creatinine, Ser: 1.79 mg/dL — ABNORMAL HIGH (ref 0.61–1.24)
GFR, Estimated: 41 mL/min — ABNORMAL LOW (ref >60)
Glucose, Bld: 95 mg/dL (ref 70–99)
Potassium: 4.4 mmol/L (ref 3.5–5.1)
Sodium: 133 mmol/L — ABNORMAL LOW (ref 135–145)
Total Bilirubin: 1.2 mg/dL (ref 0.0–1.2)
Total Protein: 6.7 g/dL (ref 6.5–8.1)

## 2024-03-23 LAB — CBC WITH DIFFERENTIAL/PLATELET
Abs Immature Granulocytes: 0.03 K/uL (ref 0.00–0.07)
Basophils Absolute: 0.1 K/uL (ref 0.0–0.1)
Basophils Relative: 1 %
Eosinophils Absolute: 0.2 K/uL (ref 0.0–0.5)
Eosinophils Relative: 2 %
HCT: 45.8 % (ref 39.0–52.0)
Hemoglobin: 16.2 g/dL (ref 13.0–17.0)
Immature Granulocytes: 0 %
Lymphocytes Relative: 20 %
Lymphs Abs: 1.7 K/uL (ref 0.7–4.0)
MCH: 34.5 pg — ABNORMAL HIGH (ref 26.0–34.0)
MCHC: 35.4 g/dL (ref 30.0–36.0)
MCV: 97.4 fL (ref 80.0–100.0)
Monocytes Absolute: 0.8 K/uL (ref 0.1–1.0)
Monocytes Relative: 10 %
Neutro Abs: 5.8 K/uL (ref 1.7–7.7)
Neutrophils Relative %: 67 %
Platelets: 182 K/uL (ref 150–400)
RBC: 4.7 MIL/uL (ref 4.22–5.81)
RDW: 12.1 % (ref 11.5–15.5)
WBC: 8.6 K/uL (ref 4.0–10.5)
nRBC: 0 % (ref 0.0–0.2)

## 2024-03-23 LAB — TROPONIN I (HIGH SENSITIVITY)
Troponin I (High Sensitivity): 5 ng/L (ref <18)
Troponin I (High Sensitivity): 5 ng/L (ref <18)

## 2024-03-23 MED ORDER — SODIUM CHLORIDE 0.9 % IV BOLUS
1000.0000 mL | Freq: Once | INTRAVENOUS | Status: AC
  Administered 2024-03-23 (×2): 1000 mL via INTRAVENOUS

## 2024-03-23 NOTE — ED Provider Notes (Signed)
 [place] EMERGENCY DEPARTMENT AT [facility] Provider Note   CSN: [id number] Arrival date & time: 03/23/24  2010     Patient presents with: Chest Pain and Near Syncope   Michael Cortez is a 68 y.o. male.   Pt is a 68 yo male with pmhx significant for HTN, CAD s/p stent, HLD, tobacco abuse, and CVA.  Pt said he leaned over at work around 1600 and stood up and felt like he was going to pass out.  He has been feeling tired since then.  He also had a little bit of CP.  He said pain is mostly gone.       Prior to Admission medications   Medication Sig Start Date End Date Taking? Authorizing Provider  amLODipine  (NORVASC ) 5 MG tablet Take 1 tablet (5 mg total) by mouth daily. 03/30/21   Gray, Sarah E, NP  baclofen (LIORESAL) 10 MG tablet Take 10 mg by mouth at bedtime as needed. 04/16/23   [provider]  Cholecalciferol (VITAMIN D ) 125 MCG (5000 UT) CAPS Take 5,000 Units by mouth daily.    [provider]  clopidogrel  (PLAVIX ) 75 MG tablet Take 1 tablet (75 mg total) by mouth daily. 03/30/21   Elnor Lauraine FORBES, NP  ezetimibe  (ZETIA ) 10 MG tablet Take 1 tablet (10 mg total) by mouth daily. 11/16/22 11/16/23  Lavona Agent, MD  losartan  (COZAAR ) 50 MG tablet Take 1 tablet (50 mg total) by mouth daily. 03/30/21   Elnor Lauraine FORBES, NP  metoprolol  succinate (TOPROL -XL) 50 MG 24 hr tablet Take 1 tablet (50 mg total) by mouth daily. 03/30/21   Elnor Lauraine FORBES, NP  NEEDLE, DISP, 25 G (BD SAFETYGLIDE NEEDLE) 25G X 1 MISC 1 Units by Does not apply route 2 (two) times a week. 01/19/21   Gosrani, Nimish C, MD  nitroGLYCERIN  (NITROSTAT ) 0.4 MG SL tablet Place 1 tablet (0.4 mg total) under the tongue every 5 (five) minutes x 3 doses as needed for chest pain. 12/27/18   Goodrich, Callie E, PA-C  pantoprazole  (PROTONIX ) 40 MG tablet Take 1 tablet (40 mg total) by mouth daily. 03/30/21   Elnor Lauraine FORBES, NP  piroxicam (FELDENE) 20 MG capsule Take by mouth. 08/06/23   [provider]   predniSONE (DELTASONE) 5 MG tablet Take by mouth as directed. 08/30/23   [provider]  rosuvastatin (CRESTOR) 5 MG tablet  03/22/23   [provider]  tadalafil  (CIALIS ) 20 MG tablet Take 1 tablet (20 mg total) by mouth daily as needed for erectile dysfunction. 03/30/21   Elnor Lauraine FORBES, NP  testosterone  cypionate (DEPOTESTOSTERONE CYPIONATE) 200 MG/ML injection Inject 0.5mLs intramuscularly once every 7 days for 2 doses. Then inject 0.25mLs intramuscularly once every 7 days for 2 doses and then stop taking the medication. 03/30/21   Elnor Lauraine FORBES, NP  TUBERCULIN SYR 1CC/25GX5/8 25G X 5/8 1 ML MISC Inject 1 Units into the muscle 2 (two) times a week. 01/19/21   Caswell Ronnell BROCKS, MD    Allergies: Atorvastatin , Crestor [rosuvastatin], and Repatha  [evolocumab ]    Review of Systems  Cardiovascular:  Positive for chest pain.  Neurological:  Positive for syncope.  All other systems reviewed and are negative.   Updated Vital Signs BP 130/68   Pulse 67   Temp 98.7 F (37.1 C) (Oral)   Resp 14   Ht 5' 8 (1.727 m)   Wt 83.5 kg   SpO2 93%   BMI 27.98 kg/m   Physical  Exam Vitals and nursing note reviewed.  Constitutional:      Appearance: He is well-developed.  HENT:     Head: Normocephalic and atraumatic.  Eyes:     Extraocular Movements: Extraocular movements intact.     Pupils: Pupils are equal, round, and reactive to light.  Cardiovascular:     Rate and Rhythm: Normal rate and regular rhythm.     Heart sounds: Normal heart sounds.  Pulmonary:     Effort: Pulmonary effort is normal.     Breath sounds: Normal breath sounds.  Abdominal:     General: Bowel sounds are normal.     Palpations: Abdomen is soft.  Musculoskeletal:        General: Normal range of motion.     Cervical back: Normal range of motion and neck supple.  Skin:    General: Skin is warm.     Capillary Refill: Capillary refill takes less than 2 seconds.  Neurological:     General: No focal  deficit present.     Mental Status: He is alert and oriented to person, place, and time.  Psychiatric:        Mood and Affect: Mood normal.        Behavior: Behavior normal.     (all labs ordered are listed, but only abnormal results are displayed) Labs Reviewed  CBC WITH DIFFERENTIAL/PLATELET - Abnormal; Notable for the following components:      Result Value   MCH 34.5 (*)    All other components within normal limits  COMPREHENSIVE METABOLIC PANEL WITH GFR - Abnormal; Notable for the following components:   Sodium 133 (*)    CO2 21 (*)    Creatinine, Ser 1.79 (*)    Calcium  8.8 (*)    GFR, Estimated 41 (*)    All other components within normal limits  TROPONIN I (HIGH SENSITIVITY)  TROPONIN I (HIGH SENSITIVITY)    EKG: EKG Interpretation Date/Time:  Monday March 23 2024 21:12:20 EDT Ventricular Rate:  75 PR Interval:  151 QRS Duration:  106 QT Interval:  382 QTC Calculation: 427 R Axis:   68  Text Interpretation: Sinus rhythm Probable left atrial enlargement Abnormal R-wave progression, early transition ST elevation, consider anterior injury No significant change since last tracing Confirmed by Dean Clarity [phone]) on 03/23/2024 9:13:36 PM  Radiology: [facility] Chest Portable 1 View Result Date: 03/23/2024 CLINICAL DATA:  Chest pain and syncopal episode EXAM: PORTABLE CHEST 1 VIEW COMPARISON:  12/25/2018 FINDINGS: Cardiac shadow is within normal limits. The lungs are well aerated bilaterally. No focal infiltrate or effusion is seen. No bony abnormality is noted. IMPRESSION: No active disease. Electronically Signed   By: Oneil Devonshire M.D.   On: 03/23/2024 22:52     Procedures   Medications Ordered in the ED  sodium chloride  0.9 % bolus 1,000 mL (0 mLs Intravenous Stopped 03/23/24 2242)                                    Medical Decision Making Amount and/or Complexity of Data Reviewed Labs: ordered. Radiology: ordered.   This patient presents to the ED for  concern of cp, this involves an extensive number of treatment options, and is a complaint that carries with it a high risk of complications and morbidity.  The differential diagnosis includes cardiac, orthostatic, electrolyte abn, dehydration   Co morbidities that complicate the patient evaluation  HTN, CAD s/p  stent, HLD, tobacco abuse, and CVA   Additional history obtained:  Additional history obtained from epic chart review    Lab Tests:  I Ordered, and personally interpreted labs.  The pertinent results include:  cbc nl, cmp nl other cr 1.79 (1.35 in 2022); trop 5   Imaging Studies ordered:  I ordered imaging studies including cxr  I independently visualized and interpreted imaging which showed No active disease.  I agree with the radiologist interpretation   Cardiac Monitoring:  The patient was maintained on a cardiac monitor.  I personally viewed and interpreted the cardiac monitored which showed an underlying rhythm of: nsr   Medicines ordered and prescription drug management:  I ordered medication including ivfs  for sx  Reevaluation of the patient after these medicines showed that the patient improved I have reviewed the patients home medicines and have made adjustments as needed   Test Considered:  cxr   Critical Interventions:  ivfs   Problem List / ED Course:  Near-syncope:  likely due to dehydration CP:  atypical.  1st trop nl.  If 2nd neg, he can go home with outpatient f/u   Reevaluation:  After the interventions noted above, I reevaluated the patient and found that they have :improved   Social Determinants of Health:  Lives at home   Dispostion:  Pending at shift change     Final diagnoses:  Atypical chest pain  Dehydration  AKI (acute kidney injury) [facility])    ED Discharge Orders     None          Dean Clarity, MD 03/23/24 2323

## 2024-03-23 NOTE — ED Triage Notes (Signed)
 Pt comes in for CP and syncope. Pt was at work became dizzy and blacked out for a few seconds. 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 When he went to stand up the pt became dizzy and feels like he blacked out for 3-4 seconds. Pt has hx of MI with a stent placement a few years back. A&Ox4.  Pt has been SOB.  Left arm feels numb.   CP is currently 1/10 in triage.

## 2024-03-24 ENCOUNTER — Other Ambulatory Visit: Payer: Self-pay

## 2024-03-27 DIAGNOSIS — E7800 Pure hypercholesterolemia, unspecified: Secondary | ICD-10-CM | POA: Diagnosis not present

## 2024-03-27 DIAGNOSIS — R5383 Other fatigue: Secondary | ICD-10-CM | POA: Diagnosis not present

## 2024-03-27 DIAGNOSIS — Z79899 Other long term (current) drug therapy: Secondary | ICD-10-CM | POA: Diagnosis not present

## 2024-03-27 DIAGNOSIS — Z299 Encounter for prophylactic measures, unspecified: Secondary | ICD-10-CM | POA: Diagnosis not present

## 2024-03-27 DIAGNOSIS — I10 Essential (primary) hypertension: Secondary | ICD-10-CM | POA: Diagnosis not present

## 2024-03-27 DIAGNOSIS — Z0000 Encounter for general adult medical examination without abnormal findings: Secondary | ICD-10-CM | POA: Diagnosis not present

## 2024-05-21 DIAGNOSIS — Z299 Encounter for prophylactic measures, unspecified: Secondary | ICD-10-CM | POA: Diagnosis not present

## 2024-05-21 DIAGNOSIS — J069 Acute upper respiratory infection, unspecified: Secondary | ICD-10-CM | POA: Diagnosis not present

## 2024-05-21 DIAGNOSIS — E7800 Pure hypercholesterolemia, unspecified: Secondary | ICD-10-CM | POA: Diagnosis not present

## 2024-05-21 DIAGNOSIS — R0981 Nasal congestion: Secondary | ICD-10-CM | POA: Diagnosis not present

## 2024-06-15 ENCOUNTER — Encounter: Payer: Self-pay | Admitting: Radiology

## 2024-12-02 ENCOUNTER — Ambulatory Visit: Admitting: Cardiology
